# Patient Record
Sex: Female | Born: 1965 | Race: Black or African American | Hispanic: No | Marital: Married | Smoking: Never smoker
Health system: Southern US, Community
[De-identification: ages and names within clinical notes are randomized; demographics above are authoritative.]

## PROBLEM LIST (undated history)

## (undated) DIAGNOSIS — D259 Leiomyoma of uterus, unspecified: Secondary | ICD-10-CM

## (undated) DIAGNOSIS — L309 Dermatitis, unspecified: Secondary | ICD-10-CM

## (undated) DIAGNOSIS — R011 Cardiac murmur, unspecified: Secondary | ICD-10-CM

## (undated) DIAGNOSIS — Z923 Personal history of irradiation: Secondary | ICD-10-CM

## (undated) DIAGNOSIS — D649 Anemia, unspecified: Secondary | ICD-10-CM

## (undated) DIAGNOSIS — Z803 Family history of malignant neoplasm of breast: Secondary | ICD-10-CM

## (undated) DIAGNOSIS — I10 Essential (primary) hypertension: Secondary | ICD-10-CM

## (undated) DIAGNOSIS — J45909 Unspecified asthma, uncomplicated: Secondary | ICD-10-CM

## (undated) DIAGNOSIS — K219 Gastro-esophageal reflux disease without esophagitis: Secondary | ICD-10-CM

## (undated) DIAGNOSIS — Z8042 Family history of malignant neoplasm of prostate: Secondary | ICD-10-CM

## (undated) DIAGNOSIS — Z801 Family history of malignant neoplasm of trachea, bronchus and lung: Secondary | ICD-10-CM

## (undated) DIAGNOSIS — IMO0001 Reserved for inherently not codable concepts without codable children: Secondary | ICD-10-CM

## (undated) DIAGNOSIS — E039 Hypothyroidism, unspecified: Secondary | ICD-10-CM

## (undated) DIAGNOSIS — N6489 Other specified disorders of breast: Secondary | ICD-10-CM

## (undated) DIAGNOSIS — R896 Abnormal cytological findings in specimens from other organs, systems and tissues: Secondary | ICD-10-CM

## (undated) DIAGNOSIS — Z8619 Personal history of other infectious and parasitic diseases: Secondary | ICD-10-CM

## (undated) DIAGNOSIS — C50919 Malignant neoplasm of unspecified site of unspecified female breast: Secondary | ICD-10-CM

## (undated) DIAGNOSIS — N870 Mild cervical dysplasia: Secondary | ICD-10-CM

## (undated) DIAGNOSIS — Z9221 Personal history of antineoplastic chemotherapy: Secondary | ICD-10-CM

## (undated) HISTORY — PX: TONSILLECTOMY: SUR1361

## (undated) HISTORY — DX: Dermatitis, unspecified: L30.9

## (undated) HISTORY — PX: MYOMECTOMY: SHX85

## (undated) HISTORY — PX: TONSILECTOMY, ADENOIDECTOMY, BILATERAL MYRINGOTOMY AND TUBES: SHX2538

## (undated) HISTORY — DX: Anemia, unspecified: D64.9

## (undated) HISTORY — DX: Personal history of other infectious and parasitic diseases: Z86.19

## (undated) HISTORY — PX: LEEP: SHX91

## (undated) HISTORY — DX: Family history of malignant neoplasm of prostate: Z80.42

## (undated) HISTORY — DX: Family history of malignant neoplasm of trachea, bronchus and lung: Z80.1

## (undated) HISTORY — DX: Unspecified asthma, uncomplicated: J45.909

## (undated) HISTORY — DX: Cardiac murmur, unspecified: R01.1

## (undated) HISTORY — DX: Leiomyoma of uterus, unspecified: D25.9

## (undated) HISTORY — PX: ADENOIDECTOMY: SUR15

## (undated) HISTORY — DX: Mild cervical dysplasia: N87.0

## (undated) HISTORY — DX: Family history of malignant neoplasm of breast: Z80.3

## (undated) HISTORY — DX: Hypothyroidism, unspecified: E03.9

## (undated) HISTORY — DX: Reserved for inherently not codable concepts without codable children: IMO0001

## (undated) HISTORY — DX: Abnormal cytological findings in specimens from other organs, systems and tissues: R89.6

---
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
## 1999-12-23 ENCOUNTER — Other Ambulatory Visit: Admission: RE | Admit: 1999-12-23 | Discharge: 1999-12-23 | Payer: Self-pay | Admitting: Obstetrics and Gynecology

## 2001-03-13 ENCOUNTER — Other Ambulatory Visit: Admission: RE | Admit: 2001-03-13 | Discharge: 2001-03-13 | Payer: Self-pay | Admitting: *Deleted

## 2002-03-23 ENCOUNTER — Other Ambulatory Visit: Admission: RE | Admit: 2002-03-23 | Discharge: 2002-03-23 | Payer: Self-pay | Admitting: *Deleted

## 2002-06-28 ENCOUNTER — Other Ambulatory Visit: Admission: RE | Admit: 2002-06-28 | Discharge: 2002-06-28 | Payer: Self-pay | Admitting: Gynecology

## 2002-11-14 ENCOUNTER — Other Ambulatory Visit: Admission: RE | Admit: 2002-11-14 | Discharge: 2002-11-14 | Payer: Self-pay | Admitting: Gynecology

## 2003-06-20 ENCOUNTER — Other Ambulatory Visit: Admission: RE | Admit: 2003-06-20 | Discharge: 2003-06-20 | Payer: Self-pay | Admitting: Gynecology

## 2003-08-07 ENCOUNTER — Inpatient Hospital Stay (HOSPITAL_COMMUNITY): Admission: RE | Admit: 2003-08-07 | Discharge: 2003-08-10 | Payer: Self-pay | Admitting: Gynecology

## 2003-08-07 ENCOUNTER — Encounter (INDEPENDENT_AMBULATORY_CARE_PROVIDER_SITE_OTHER): Payer: Self-pay | Admitting: Specialist

## 2003-10-28 ENCOUNTER — Other Ambulatory Visit: Admission: RE | Admit: 2003-10-28 | Discharge: 2003-10-28 | Payer: Self-pay | Admitting: Gynecology

## 2004-01-17 ENCOUNTER — Other Ambulatory Visit: Admission: RE | Admit: 2004-01-17 | Discharge: 2004-01-17 | Payer: Self-pay | Admitting: Gynecology

## 2004-04-27 ENCOUNTER — Other Ambulatory Visit: Admission: RE | Admit: 2004-04-27 | Discharge: 2004-04-27 | Payer: Self-pay | Admitting: Gynecology

## 2004-10-15 ENCOUNTER — Other Ambulatory Visit: Admission: RE | Admit: 2004-10-15 | Discharge: 2004-10-15 | Payer: Self-pay | Admitting: Gynecology

## 2005-01-25 ENCOUNTER — Other Ambulatory Visit: Admission: RE | Admit: 2005-01-25 | Discharge: 2005-01-25 | Payer: Self-pay | Admitting: Gynecology

## 2005-04-27 ENCOUNTER — Other Ambulatory Visit: Admission: RE | Admit: 2005-04-27 | Discharge: 2005-04-27 | Payer: Self-pay | Admitting: Gynecology

## 2005-09-24 ENCOUNTER — Other Ambulatory Visit: Admission: RE | Admit: 2005-09-24 | Discharge: 2005-09-24 | Payer: Self-pay | Admitting: Gynecology

## 2005-12-23 ENCOUNTER — Ambulatory Visit (HOSPITAL_COMMUNITY): Admission: RE | Admit: 2005-12-23 | Discharge: 2005-12-23 | Payer: Self-pay | Admitting: Obstetrics and Gynecology

## 2007-02-25 ENCOUNTER — Emergency Department (HOSPITAL_COMMUNITY): Admission: EM | Admit: 2007-02-25 | Discharge: 2007-02-25 | Payer: Self-pay | Admitting: Family Medicine

## 2007-05-15 ENCOUNTER — Ambulatory Visit (HOSPITAL_COMMUNITY): Admission: RE | Admit: 2007-05-15 | Discharge: 2007-05-15 | Payer: Self-pay | Admitting: Obstetrics and Gynecology

## 2008-04-03 ENCOUNTER — Ambulatory Visit (HOSPITAL_COMMUNITY): Admission: RE | Admit: 2008-04-03 | Discharge: 2008-04-03 | Payer: Self-pay | Admitting: Specialist

## 2008-04-19 ENCOUNTER — Encounter: Admission: RE | Admit: 2008-04-19 | Discharge: 2008-04-19 | Payer: Self-pay | Admitting: Obstetrics and Gynecology

## 2009-03-21 ENCOUNTER — Ambulatory Visit (HOSPITAL_COMMUNITY): Admission: RE | Admit: 2009-03-21 | Discharge: 2009-03-22 | Payer: Self-pay | Admitting: Obstetrics and Gynecology

## 2009-03-21 ENCOUNTER — Encounter (INDEPENDENT_AMBULATORY_CARE_PROVIDER_SITE_OTHER): Payer: Self-pay | Admitting: Obstetrics and Gynecology

## 2009-05-05 ENCOUNTER — Ambulatory Visit (HOSPITAL_COMMUNITY): Admission: RE | Admit: 2009-05-05 | Discharge: 2009-05-05 | Payer: Self-pay | Admitting: Internal Medicine

## 2009-09-29 ENCOUNTER — Encounter: Admission: RE | Admit: 2009-09-29 | Discharge: 2009-09-29 | Payer: Self-pay | Admitting: Obstetrics and Gynecology

## 2010-02-08 ENCOUNTER — Encounter: Payer: Self-pay | Admitting: Obstetrics and Gynecology

## 2010-04-06 ENCOUNTER — Ambulatory Visit (HOSPITAL_COMMUNITY)
Admission: RE | Admit: 2010-04-06 | Discharge: 2010-04-06 | Disposition: A | Discharge status: Home / Self Care | Payer: BC Managed Care – PPO | Source: Ambulatory Visit | Attending: Obstetrics and Gynecology | Admitting: Obstetrics and Gynecology

## 2010-04-06 ENCOUNTER — Other Ambulatory Visit: Payer: Self-pay | Admitting: Obstetrics and Gynecology

## 2010-04-06 DIAGNOSIS — N870 Mild cervical dysplasia: Secondary | ICD-10-CM | POA: Insufficient documentation

## 2010-04-06 LAB — PREGNANCY, URINE: Preg Test, Ur: NEGATIVE

## 2010-04-12 LAB — CBC
HCT: 34.3 % — ABNORMAL LOW (ref 36.0–46.0)
Hemoglobin: 11.2 g/dL — ABNORMAL LOW (ref 12.0–15.0)
Hemoglobin: 12.4 g/dL (ref 12.0–15.0)
MCHC: 33.6 g/dL (ref 30.0–36.0)
Platelets: 203 10*3/uL (ref 150–400)
Platelets: 246 10*3/uL (ref 150–400)
RBC: 3.95 MIL/uL (ref 3.87–5.11)
RBC: 4.33 MIL/uL (ref 3.87–5.11)
RDW: 13.1 % (ref 11.5–15.5)
RDW: 13.4 % (ref 11.5–15.5)
WBC: 15.9 10*3/uL — ABNORMAL HIGH (ref 4.0–10.5)

## 2010-04-12 LAB — ABO/RH: ABO/RH(D): O POS

## 2010-04-12 LAB — BASIC METABOLIC PANEL
BUN: 11 mg/dL (ref 6–23)
Sodium: 138 mEq/L (ref 135–145)

## 2010-04-12 LAB — TYPE AND SCREEN: ABO/RH(D): O POS

## 2010-04-19 NOTE — Op Note (Signed)
  Veronica Reilly, DAM               ACCOUNT NO.:  [id number]  MEDICAL RECORD NO.:  [id number]           PATIENT TYPE:  O  LOCATION:  [facility]                          [facility]  PHYSICIAN:  Crist Fat. Halbert Jesson, M.D. DATE OF BIRTH:  27-Aug-1965  DATE OF PROCEDURE: DATE OF DISCHARGE:                              OPERATIVE REPORT   PREOPERATIVE DIAGNOSIS:  Mild dysplasia on endocervical curettage.  POSTOPERATIVE DIAGNOSIS:  Mild dysplasia on endocervical curettage.  ANESTHESIA:  Local, Dr. Estanislado Pandy.  PROCEDURE:  Loop electrical excision procedure (LEEP).  SURGEON:  Crist Fat. Nicie Milan, MD  ASSISTANT:  None.  ESTIMATED BLOOD LOSS:  Minimal.  PROCEDURE:  After being informed of the planned procedure with possible complications including bleeding, infection, persistence of disease, cervical stenosis, and cervical incompetence, informed consent was obtained.  The patient was taken to OR #7 and placed in the lithotomy position.  A speculum was inserted and the cervix was prepped with acetic acid.  Colposcopy reveals the previously known lesion markedly from 12 to 3 and from 4 to 7 with sharp borders in a mild mosaic pattern.  We proceeded with local anesthesia with a paracervical block using lidocaine 1% with epinephrine 1 in 200,000, 15 mL in the usual fashion.  Using a medium-sized loupe, we then excised the posterior lip and then the anterior lip of the cervix removing the whole T-zone and the previously described lesion.  The excision surface was then cauterized and Monsel was applied.  Hemostasis was adequate. Instruments were removed.  Instruments and sponge count was complete x2. Estimated blood loss was minimal.  Procedures well tolerated by the patient who was taken to recovery room and discharged home in a well and stable condition.  SPECIMEN:  Anterior lip and posterior lip of the cervix sent to Pathology.     Crist Fat Draven Laine, M.D.     SAR/MEDQ  D:  04/06/2010  T:   04/07/2010  Job:  [id number]  Electronically Signed by Silverio Lay M.D. on 04/19/2010 10:07:33 PM

## 2010-06-02 NOTE — Op Note (Signed)
Veronica Reilly, Veronica Reilly               ACCOUNT NO.:  [id number]   MEDICAL RECORD NO.:  [id number]          PATIENT TYPE:  AMB   LOCATION:  [facility]                           [facility]   PHYSICIAN:  Fermin Schwab, MD   DATE OF BIRTH:  01/18/1966   DATE OF PROCEDURE:  05/15/2007  DATE OF DISCHARGE:                               OPERATIVE REPORT   PREOPERATIVE DIAGNOSES:  Probable pelvic adhesions, uterine myomas.   POSTOPERATIVE DIAGNOSES:  Pelvic adhesions, uterine myomas, stage I  endometriosis of pelvic peritoneum.   PROCEDURE:  Laparoscopy, lysis of adhesions (electrosurgical), excision  and ablation of endometriosis.   SURGEON:  Fermin Schwab, MD.   ANESTHESIA:  General endotracheal.   COMPLICATIONS:  None.   ESTIMATED BLOOD LOSS:  Less than 20 mL.   SPECIMENS:  Right iliac fossa biopsy and uterine adhesions to pathology.   FINDINGS:  Exam under anesthesia, the uterus was enlarged to 8-9 weeks  with myomatous nodules.  There was a posterior one that was prominent  during exam of the adnexa.  The adnexa were otherwise unremarkable.  No  palpable masses were present.  The cervix and the vagina were within  normal limits.  On laparoscopy the liver, gallbladder, and diaphragm  surfaces were normal.  The appendix appeared normal.  On inspection of  the pelvis the uterus contained several small nodules, 2-3 cm.  The most  prominent one was the lower uterine posterior myoma that was intramural.  There were filmy adhesions to the posterior left side of the uterus from  the epiploic appendix, as well as filmy adhesions to the midline of the  uterus posteriorly.  The left tube was somewhat kinked at its mid  section, but the fimbriae were 4/5.  It was patent to chromotubation.  The left ovary had filmy adhesions to the ovarian fossa covering less  than 1/5 of it surface area.  It contained a corpus luteum.  The right  fallopian tube was normal with fimbria 5/5.  It was patent  to  chromotubation.  The right ovary was grossly normal.  The inspection of  the posterior cul-de-sac was somewhat difficult because of the lower  uterine myoma.  As it was exposed there were polypoid lesions of  endometriosis at the insertion of the left uterosacral ligament.  There  was also a nodule in the right ovarian fossa that was excised.   DESCRIPTION OF PROCEDURE:  The patient was placed in the lithotomy  position.  She was prepped and draped in the sterile manner.  The  bladder catheterized with a Foley.  A vaginal speculum was inserted and  the uterus sounded to 9 cm.  A ZUMI manipulator/injector cannula was  inserted into the uterus and it was connected to a syringe containing  indigo carmine.  The surgeon was re-gloved and the operative field was  created on the abdomen.  After preemptive anesthesia with 0.25% Marcaine  and 1:200,000 epinephrine a 10 mm vertical infraumbilical skin incision  was made and the Veress needle was inserted.  Its correct location as  verified with low opening pressure.  A pneumoperitoneum was created with  carbon dioxide.  A 10 mm trocar was then inserted.  The video  laparoscopy was started.  Two 5 mm trocars were placed in each lower  abdominal quadrant and ancillary instruments were used through these  ports.  The above findings were noted.  Using monopolar needle electrode  at a setting of 35 watts cutting the adhesions were taken down and they  were excised from the large bowel.  The left ovary was freed from up its  adhesion.  The left uterosacral ligament lesion was ablated again with  needle electrode.  The right iliac fossa lesion was excised.  Hemostasis  was checked.  A slurry consisting of one sheath of Seprafilm and 20 mL  of lactated Ringer was instilled onto the uterine and ovarian surface  and rectosigmoid surface and into the cul-de-sac for prevention of  adhesions.  The estimated blood loss was less than 10 mL.  The gas was   allowed to escape.  The infraumbilical incision was closed with 2-0  Vicryl figure-of-eight suture.  Dermabond was used to approximate the  skin edges.  The patient tolerated the procedure well and was  transferred to the recovery room in satisfactory condition.      Fermin Schwab, MD  Electronically Signed     TY/MEDQ  D:  05/15/2007  T:  05/15/2007  Job:  [id number]   cc:   Maxie Better, M.D.  Fax: [phone]

## 2010-06-05 NOTE — Op Note (Signed)
Veronica Reilly, Veronica Reilly                           ACCOUNT NO.:  [id number]   MEDICAL RECORD NO.:  [id number]                   PATIENT TYPE:  INP   LOCATION:  X004                                 FACILITY:  [facility]   PHYSICIAN:  Ivor Costa. Farrel Gobble, M.D.              DATE OF BIRTH:  March 14, 1965   DATE OF PROCEDURE:  08/07/2003  DATE OF DISCHARGE:                                 OPERATIVE REPORT   PREOPERATIVE DIAGNOSES:  1. Multifibroid uterus.  2. Endometrial defect.   POSTOPERATIVE DIAGNOSIS:  1. Multifibroid uterus.  2. Endometrial defect.   PROCEDURE:  1. Multiple myomectomy.  2. Submucosal polypectomy.  3. Attempted chromopertubation.   SURGEON:  Ivor Costa. Farrel Gobble, M.D.   ASSISTANTMarcial Pacas P. Fontaine, M.D.   ANESTHESIA:  General.   IV FLUIDS:  2200 cc of lactated Ringer's.   ESTIMATED BLOOD LOSS:  150 cc.   URINE OUTPUT:  875 cc of clear urine.   FINDINGS:  The uterus was markedly distorted.  There was approximately an 8  x 6 cm intramural posterior fibroid and 4-5 cm anterior wall fibroid with a  small cluster of additional fibroids in the anterior region.  There was also  a 4 cm bundle fibroid that was distinctly separate from the tubal ostia.  There were two 2 cm superficial subserosal fibroids.  There was a polypoid  submucosal defect and normal-appearing tubes; however, no flow by  chromopertubation.  Pathology for multiple fibroids and a portion of the  myometrium and endometrial polyp.   COMPLICATIONS:  None.   PROCEDURE:  Patient was taken to the operating room, and general anesthesia  was induced.  Placed in the supine position.  Prepped and draped in the  usual sterile fashion.  A sterile bivalve speculum was placed into the  vagina.  The cervix was visualized.  A #8 pediatric Foley was advanced  through the cervix towards the fundus, and the balloon was blown up.  This  was attached to sterile tubing for chromopertubations to be done during the  procedure.  She was then draped in the usual sterile fashion.  A  Pfannenstiel skin incision was made with the scalpel and carried through an  underlying layer of fascia with the electrocautery.  The fascia was scored  in the midline, and an incision was made laterally with the Mayo scissors.  Deep thoracic and fascial scissors were grasped.  Kochers and underlying  rectus muscles were dissected off for blunt and sharp dissection.  In a  similar fashion, the superior aspect of the tissue was grasped with Kochers.  The underlying rectus muscles were dissected off.  The rectus muscles were  separated in the midline.  The peritoneum was identified and entered  bluntly.  The peritoneal incision was then incised superiorly and  inferiorly.  Good visualization of underlying bowel and bladder.  The  examining hand was then placed into the pelvis  and with effort, the uterus  was able to be delivered through the incision.  The large posterior fibroid  was somewhat distorting, but it did allow [place] to place the uterus above the  incision.  At this point, we attempt to chromopertubate.  Despite free flow  of the fluid, we were unable to get any flow from the tube and perhaps this  was related to the tension that the uterus was in, based on the anatomy.  The tubal fimbria; however, appeared to be __________ .  The fibroids were  then individually injected with dilute Pitressin solution with 20 units in  100, a total of 23 cc was used.  The serosa was then scored with the  cautery, and the dissection was carried through until the fibroid was  reached.  The fibroid was then grasped with a single-tooth tenaculum and  sharply and bluntly dissected off posteriorly.  The area was noted to be  relatively hemostatic.  The posterior defect was reapproximated with Allis  clamps.  As we turned our attention anteriorly and in a similar fashion, the  serosa was injected, incised, and the fibroid was removed.  There  noted to  be a small cluster of approximately 3-4 very small fibroids that were  appreciated anteriorly, and we just sharply excised this with a portion of  the myometrium again, and similarly, this area was reapproximated with Allis  clamps until closure.  The fundal fibroid was similarly treated.  All of  these fibroids, although deep into the myometrium, none of them entered the  cavity.  Because we knew of the cavity defects, we had already planned on  entering the cavity, and we have elected to do this posteriorly.  The  superficial subserosal fibroids were able to be sharply dissected off.  We  then began to close our defects.  The superficial defects were brought  together with 3-0 Vicryl and noted to be hemostatic.  There was a very large  defect within the anterior fibroid, and some of the excess myometrium was  sharply dissected off.  The myometrium was then reapproximated with  interrupted 0 Vicryl.  The upper-most layers were brought together with 3-0  Vicryl and noted to be hemostatic.  The fundal area was also reapproximated  deeply with 0 Vicryl with the most superficial area being treated with 3-0  Vicryl again.  The posterior defect was then palpated.  It was extended for  a small amount, and the cavity was entered, as evidenced by the blue dye.  Palpation initially over the cavity failed to demonstrate the 2.5 cm defect,  which was felt to be a fibroid.  On sonohystogram, there was also a  questionable endometrial polyp.  We were able to visualize the polypoid  mass.  This was grasped with an Allis clamp and gently dissected off.  Reinspection of the cavity confirmed that it was clean, smooth, without any  gross defects.  The endometrium was then reapproximated with interrupted 3-0  Vicryl.  The myometrium above it was reapproximated with 0 Vicryl in  multiple deep layers, after which the serosa was closed with 3-0 Vicryl again.  The incisions were irrigated and noted to  be hemostatic.  The uterus  was then returned back to the pelvis.  The patient was noted to have a very  prominent sacrum, which limited our ability to successfully place the  interseed posteriorly; however, we were able to place interseed over the  anterior and fundal regions after  an O'Connor-O'Sullivan retractor was  placed.  The incisions remained hemostatic-appearing, once returning to the  pelvis.  The fascia was then closed with 0 Vicryl in a running fashion.  The  subcu was irrigated, and the skin was closed with staples.  The patient  tolerated the procedure well.  Sponge, lap, and needle counts were correct  x2.  She was transferred to the PACU in stable condition.                                               Ivor Costa. Farrel Gobble, M.D.    Leda Roys  D:  08/07/2003  T:  08/07/2003  Job:  [id number]

## 2010-06-05 NOTE — Discharge Summary (Signed)
NAMELAVERGNE, HILTUNEN               ACCOUNT NO.:  [id number]   MEDICAL RECORD NO.:  [id number]          PATIENT TYPE:  INP   LOCATION:  0468                         [facility]ACILITY:  [facility]   PHYSICIAN:  Ivor Costa. Farrel Gobble, M.D. DATE OF BIRTH:  11/16/1965   DATE OF ADMISSION:  08/07/2003  DATE OF DISCHARGE:  08/10/2003                                 DISCHARGE SUMMARY   PRINCIPAL DIAGNOSIS:  Multifibroid uterus.   ADDITIONAL DIAGNOSIS:  Endometrial defect.   PRINCIPAL PROCEDURE:  Multiple myomectomy.   ADDITIONAL PROCEDURE:  Submucosal polypectomy and attempt at  chromopertubation.   HISTORY OF PRESENT ILLNESS:  Please refer to the dictated H&P.   HOSPITAL COURSE:  The patient was admitted in the afternoon of August 07, 2003, and underwent a multiple myomectomy with a submucosal polypectomy  under general anesthesia.  Her operative findings were consistent with a  markedly distorted uterus.  There were multiple intramural myomas and a  subserosal polypoid defect.  The patient's estimated blood loss was 350 mL.  Her postoperative course was unremarkable.  She was extubated in the OR,  transferred to the PACU in stable condition and up to the postoperative  floor in due fashion.  Her postoperative course was unremarkable.  By postop  day #3, the patient was ambulating without differential, tolerating a  regular diet, requiring minimal pain medication that was treated orally.  She was without any complaints.   POSTOPERATIVE LABORATORY DATA:  Her white count was 14.1; her hemoglobin was  9.2; her hematocrit was 28.5, and her platelets were 225.   The patient was discharged home with instructions to follow up in the office  in 2 weeks.  She was given a prescription preoperatively for Tylox 1-2 q.6h.  p.r.n. pain.  She will mix it with over-the-counter Motrin.  Her condition  is stable.      THL/MEDQ  D:  10/13/2003  T:  10/14/2003  Job:  [id number]

## 2010-06-05 NOTE — H&P (Signed)
NAME:  Veronica Reilly, Veronica Reilly                           ACCOUNT NO.:  [id number]   MEDICAL RECORD NO.:  [id number]                   PATIENT TYPE:   LOCATION:                                       FACILITY:   PHYSICIAN:  Ivor Costa. Farrel Gobble, M.D.              DATE OF BIRTH:   DATE OF ADMISSION:  DATE OF DISCHARGE:                                HISTORY & PHYSICAL   CHIEF COMPLAINT:  Multifibroid uterus desiring fertility.   HISTORY OF PRESENT ILLNESS:  The patient is a 45 year old G0 with a known  multifibroid uterus and subsequent menorrhagia, who is desiring fertility.  Her uterus is, by ultrasound, 20 x 9-1/2 x 9-1/2 with the endometrium being  displaced by the myomas.  There are also 2 endometrial defects, which are  suspicious for being endometrial fibroids as well that measure 2-1/2 x 2-1/2  and 2-1/2 x 10.  The patient has 4 rather large fibroids that are between 6  and 8 cm each.  Because of an iodine allergy, the patient has not had any  __________ done and accepts the limitations regarding her tubes.  She will  have a chromopertubation done at the same time.  The patient states that her  cycles are monthly.  However, she is on oral contraceptives.  She flows for  about 7 days despite being on oral contraceptives.  She does have a history  of anemia with her latest hemoglobin being 9.6.  They have never attempted  pregnancy because of medical issues with her husband, which have now  resolved.  Her Pap smears have otherwise been normal and she has a negative  gynecologic history.   PAST MEDICAL HISTORY:  Negative.   PAST SURGICAL HISTORY:  Negative.   MEDICATIONS:  Yasmin.   ALLERGIES:  1. IODINE.  2. GRASS.  3. TREES.  4. POLLEN.  5. DUST.   SOCIAL HISTORY:  She is married.  No alcohol, no tobacco.  Rare caffeine.  Some exercise in the form of walking.   FAMILY HISTORY:  Positive for breast cancer in a grandmother.  Otherwise  negative for uterine and ovarian.   PHYSICAL  EXAMINATION:  GENERAL:  She is well-appearing.  VITAL SIGNS:  Her heart was regular rate.  LUNGS:  Clear to auscultation.  ABDOMEN:  There is a fullness at the umbilicus that extends towards the hips  bilaterally consistent with her fibroid uterus.  GYNECOLOGIC:  She has normal external female genitalia.  The BUS is  negative.  The vagina had a small amount of menstruum in it.  The cervix is  without gross lesions.  Bimanual examination, there is a large posterior  fibroid that is appreciable.  Immediately upon doing the examination the  uterus is at the umbilicus, bulky, and irregular.   ASSESSMENT:  Multifibroid uterus with 2 probable submucosal myomas as well  as several pedunculated and intramural myomas with an iodine allergy.  The  patient will undergo a multiple myomectomy.  We will plan on opening the  cavity at the time in order to remove the 2 endometrial defects.  We will  attempt to do a chromopertubation prior to the procedure.  However, the  patient is aware that we may not be able to get flow from the tube because  of impingement from the fibroids at the beginning of the case and from  having a defect in the endometrium from the procedure at the end of the case  but is willing to accept this.  The patient was also counseled for the  potential for hysterectomy and will sign a consent accordingly as well as  the risk for transfusion.  All questions were addressed.  She was given her  postoperative pain management in the form of Combunox for postoperative  care.                                               Ivor Costa. Farrel Gobble, M.D.    THL/MEDQ  D:  08/05/2003  T:  08/05/2003  Job:  [id number]

## 2010-10-13 LAB — URINALYSIS, ROUTINE W REFLEX MICROSCOPIC
Bilirubin Urine: NEGATIVE
Glucose, UA: NEGATIVE
Hgb urine dipstick: NEGATIVE
Ketones, ur: NEGATIVE
Specific Gravity, Urine: 1.01

## 2010-10-13 LAB — DIFFERENTIAL
Basophils Absolute: 0.1
Basophils Relative: 1
Eosinophils Relative: 3
Monocytes Relative: 9
Neutro Abs: 5.5

## 2010-10-13 LAB — COMPREHENSIVE METABOLIC PANEL
ALT: 13
Alkaline Phosphatase: 48
GFR calc Af Amer: >60
GFR calc non Af Amer: >60
Glucose, Bld: 98
Potassium: 3.6
Sodium: 133 — ABNORMAL LOW

## 2010-10-13 LAB — CBC
MCHC: 33.6
MCV: 84.3
Platelets: 213

## 2010-10-13 LAB — ABO/RH: ABO/RH(D): O POS

## 2010-10-13 LAB — TYPE AND SCREEN: ABO/RH(D): O POS

## 2010-10-13 LAB — HCG, SERUM, QUALITATIVE: Preg, Serum: NEGATIVE

## 2010-10-19 ENCOUNTER — Other Ambulatory Visit: Payer: Self-pay | Admitting: Obstetrics and Gynecology

## 2010-10-19 DIAGNOSIS — Z1231 Encounter for screening mammogram for malignant neoplasm of breast: Secondary | ICD-10-CM

## 2010-10-29 ENCOUNTER — Ambulatory Visit: Payer: BC Managed Care – PPO

## 2010-11-03 ENCOUNTER — Ambulatory Visit: Payer: BC Managed Care – PPO

## 2010-11-16 ENCOUNTER — Ambulatory Visit: Payer: BC Managed Care – PPO

## 2010-11-16 ENCOUNTER — Ambulatory Visit
Admission: RE | Admit: 2010-11-16 | Discharge: 2010-11-16 | Disposition: A | Discharge status: Home / Self Care | Payer: BC Managed Care – PPO | Source: Ambulatory Visit | Attending: Obstetrics and Gynecology | Admitting: Obstetrics and Gynecology

## 2010-11-16 DIAGNOSIS — Z1231 Encounter for screening mammogram for malignant neoplasm of breast: Secondary | ICD-10-CM

## 2011-04-12 ENCOUNTER — Ambulatory Visit (INDEPENDENT_AMBULATORY_CARE_PROVIDER_SITE_OTHER): Payer: BC Managed Care – PPO | Admitting: Obstetrics and Gynecology

## 2011-04-12 DIAGNOSIS — N870 Mild cervical dysplasia: Secondary | ICD-10-CM

## 2011-08-31 ENCOUNTER — Telehealth: Payer: Self-pay

## 2011-08-31 NOTE — Telephone Encounter (Signed)
LM for pt that pap was due in July along with AEX.  ld

## 2011-09-21 ENCOUNTER — Encounter: Payer: Self-pay | Admitting: Obstetrics and Gynecology

## 2011-09-21 ENCOUNTER — Ambulatory Visit (INDEPENDENT_AMBULATORY_CARE_PROVIDER_SITE_OTHER): Payer: BC Managed Care – PPO | Admitting: Obstetrics and Gynecology

## 2011-09-21 VITALS — BP 124/62 | Ht 66.5 in | Wt 222.0 lb

## 2011-09-21 DIAGNOSIS — Z124 Encounter for screening for malignant neoplasm of cervix: Secondary | ICD-10-CM

## 2011-09-21 DIAGNOSIS — N870 Mild cervical dysplasia: Secondary | ICD-10-CM

## 2011-09-21 NOTE — Progress Notes (Signed)
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 female who presents for a  repeat pap    Objective:    External genitalia: normal Perianal skin: no external genital warts noted Vagina: normal without discharge Cervix: normal in appearance  Assessment and Plan:   CIN 1  Pap # 2/3 performed Return to the office in 4 months for repeat Pap until 3 consecutive normal Pap Smears Continue Folic Acid 1 mg daily Referred to Dr Allyne Gee for PCP / weight loss Pt to see Dr Elesa Hacker for donor egg / IVF   Silverio Lay, MD 9/3/20139:09 AM

## 2011-09-24 LAB — PAP IG W/ RFLX HPV ASCU

## 2011-11-29 ENCOUNTER — Other Ambulatory Visit: Payer: Self-pay | Admitting: Obstetrics and Gynecology

## 2011-11-29 DIAGNOSIS — Z1231 Encounter for screening mammogram for malignant neoplasm of breast: Secondary | ICD-10-CM

## 2012-01-07 ENCOUNTER — Ambulatory Visit: Payer: BC Managed Care – PPO

## 2012-01-27 ENCOUNTER — Ambulatory Visit: Payer: BC Managed Care – PPO

## 2012-02-09 ENCOUNTER — Encounter: Payer: Self-pay | Admitting: Obstetrics and Gynecology

## 2012-02-09 ENCOUNTER — Ambulatory Visit: Payer: BC Managed Care – PPO | Admitting: Obstetrics and Gynecology

## 2012-02-09 ENCOUNTER — Telehealth: Payer: Self-pay | Admitting: Obstetrics and Gynecology

## 2012-02-09 VITALS — BP 120/64 | Ht 66.5 in | Wt 226.0 lb

## 2012-02-09 DIAGNOSIS — D219 Benign neoplasm of connective and other soft tissue, unspecified: Secondary | ICD-10-CM

## 2012-02-09 DIAGNOSIS — IMO0002 Reserved for concepts with insufficient information to code with codable children: Secondary | ICD-10-CM

## 2012-02-09 DIAGNOSIS — Z01419 Encounter for gynecological examination (general) (routine) without abnormal findings: Secondary | ICD-10-CM

## 2012-02-09 DIAGNOSIS — E039 Hypothyroidism, unspecified: Secondary | ICD-10-CM | POA: Insufficient documentation

## 2012-02-09 NOTE — Progress Notes (Signed)
The patient reports:no complaints  Contraception:none  Last mammogram: approximate date 11/18/2010 and was normal  Last pap: approximate date 04/12/2011 and was normal  GC/Chlamydia cultures offered: declined HIV/RPR/HbsAg offered:  declined HSV 1 and 2 glycoprotein offered: declined  Menstrual cycle regular and monthly: Yes Pt does state that her cycles are every 22 days instead of 28 Menstrual flow normal: Yes  Urinary symptoms: none Normal bowel movements: Yes Reports abuse at home: No:   Subjective:    Veronica Reilly is a 47 y.o. female G0P0000 who presents for annual exam.  The patient has no complaints today.   The following portions of the patient's history were reviewed and updated as appropriate: allergies, current medications, past family history, past medical history, past social history, past surgical history and problem list.  Review of Systems Pertinent items are noted in HPI. Gastrointestinal:No change in bowel habits, no abdominal pain, no rectal bleeding Genitourinary:negative for dysuria, frequency, hematuria, nocturia and urinary incontinence    Objective:     BP 120/64  Ht 5' 6.5" (1.689 m)  Wt 226 lb (102.513 kg)  BMI 35.93 kg/m2  LMP 02/09/2012  Weight:  Wt Readings from Last 1 Encounters:  02/09/12 226 lb (102.513 kg)     BMI: Body mass index is 35.93 kg/(m^2). General Appearance: Alert, appropriate appearance for age. No acute distress HEENT: Grossly normal Neck / Thyroid: Supple, no masses, nodes or enlargement Lungs: clear to auscultation bilaterally Back: No CVA tenderness Breast Exam: No masses or nodes.No dimpling, nipple retraction or discharge. Cardiovascular: Regular rate and rhythm. S1, S2,  Murmur 3/6 de novo Gastrointestinal: Soft, non-tender, no masses or organomegaly Pelvic Exam: Vulva and vagina appear normal. Bimanual exam reveals normal uterus and adnexa. Rectovaginal: normal rectal, no masses Lymphatic Exam: Non-palpable  nodes in neck, clavicular, axillary, or inguinal regions Skin: no rash or abnormalities Neurologic: Normal gait and speech, no tremor  Psychiatric: Alert and oriented, appropriate affect.   Assessment:    Normal gyn exam  CIN 1 s/p LEEP Heart murmur   Plan:   mammogram pap smear #2/3 return annually or prn Mmg scheduled for 02/2012 Heart Murmur discussed / Echocardiogram reccommended Referral to [facility] for Murmur   Silverio Lay MD

## 2012-02-10 ENCOUNTER — Telehealth: Payer: Self-pay

## 2012-02-10 LAB — PAP IG W/ RFLX HPV ASCU

## 2012-02-10 NOTE — Telephone Encounter (Signed)
Apt scheduled with [facility] for Monday 02/28/2012@ 2:30 p.m.  Pt aware  Darien Ramus, CMA

## 2012-02-10 NOTE — Telephone Encounter (Signed)
Message copied by Darien Ramus on Thu Feb 10, 2012  9:19 AM ------      Message from: Silverio Lay      Created: Wed Feb 09, 2012  4:28 PM       Please refer to cardiologist

## 2012-02-11 LAB — HUMAN PAPILLOMAVIRUS, HIGH RISK: HPV DNA High Risk: NOT DETECTED

## 2012-02-15 ENCOUNTER — Encounter: Payer: Self-pay | Admitting: Obstetrics and Gynecology

## 2012-02-15 NOTE — Progress Notes (Signed)
Quick Note:  Please send ASCUS letter. Pap in 1 year. ______ 

## 2012-02-21 ENCOUNTER — Ambulatory Visit
Admission: RE | Admit: 2012-02-21 | Discharge: 2012-02-21 | Disposition: A | Discharge status: Home / Self Care | Payer: BC Managed Care – PPO | Source: Ambulatory Visit | Attending: Obstetrics and Gynecology | Admitting: Obstetrics and Gynecology

## 2012-02-21 DIAGNOSIS — Z1231 Encounter for screening mammogram for malignant neoplasm of breast: Secondary | ICD-10-CM

## 2012-02-23 ENCOUNTER — Other Ambulatory Visit: Payer: Self-pay | Admitting: Obstetrics and Gynecology

## 2012-02-23 ENCOUNTER — Telehealth: Payer: Self-pay | Admitting: Obstetrics and Gynecology

## 2012-02-23 DIAGNOSIS — R928 Other abnormal and inconclusive findings on diagnostic imaging of breast: Secondary | ICD-10-CM

## 2012-02-24 NOTE — Progress Notes (Signed)
Quick Note:  The recent mammogram is incomplete / abnormal suggesting a close follow-up / additional images. When is the next appointment to follow-up on this mammogram?  Please document in chart. ______ 

## 2012-03-07 ENCOUNTER — Ambulatory Visit
Admission: RE | Admit: 2012-03-07 | Discharge: 2012-03-07 | Disposition: A | Discharge status: Home / Self Care | Payer: BC Managed Care – PPO | Source: Ambulatory Visit | Attending: Obstetrics and Gynecology | Admitting: Obstetrics and Gynecology

## 2012-03-07 DIAGNOSIS — R928 Other abnormal and inconclusive findings on diagnostic imaging of breast: Secondary | ICD-10-CM

## 2012-08-24 ENCOUNTER — Other Ambulatory Visit: Payer: Self-pay | Admitting: Obstetrics and Gynecology

## 2012-08-24 DIAGNOSIS — N6001 Solitary cyst of right breast: Secondary | ICD-10-CM

## 2012-09-12 ENCOUNTER — Ambulatory Visit
Admission: RE | Admit: 2012-09-12 | Discharge: 2012-09-12 | Disposition: A | Discharge status: Home / Self Care | Payer: BC Managed Care – PPO | Source: Ambulatory Visit | Attending: Obstetrics and Gynecology | Admitting: Obstetrics and Gynecology

## 2012-09-12 DIAGNOSIS — N6001 Solitary cyst of right breast: Secondary | ICD-10-CM

## 2012-12-02 ENCOUNTER — Ambulatory Visit (INDEPENDENT_AMBULATORY_CARE_PROVIDER_SITE_OTHER): Payer: BC Managed Care – PPO | Admitting: Emergency Medicine

## 2012-12-02 VITALS — BP 130/82 | HR 96 | Temp 97.9°F | Resp 18 | Ht 65.0 in | Wt 217.0 lb

## 2012-12-02 DIAGNOSIS — J209 Acute bronchitis, unspecified: Secondary | ICD-10-CM

## 2012-12-02 DIAGNOSIS — R05 Cough: Secondary | ICD-10-CM

## 2012-12-02 DIAGNOSIS — J45901 Unspecified asthma with (acute) exacerbation: Secondary | ICD-10-CM

## 2012-12-02 LAB — POCT RAPID STREP A (OFFICE): Rapid Strep A Screen: NEGATIVE

## 2012-12-02 MED ORDER — AZITHROMYCIN 250 MG PO TABS: ORAL_TABLET | ORAL | Status: DC

## 2012-12-02 MED ORDER — PREDNISONE 10 MG PO TABS: ORAL_TABLET | ORAL | Status: DC

## 2012-12-02 MED ORDER — ALBUTEROL SULFATE (2.5 MG/3ML) 0.083% IN NEBU
2.5000 mg | INHALATION_SOLUTION | Freq: Once | RESPIRATORY_TRACT | Status: AC
  Administered 2012-12-02: 2.5 mg via RESPIRATORY_TRACT

## 2012-12-02 NOTE — Progress Notes (Signed)
This chart was scribed for Veronica Chris, MD by Joaquin Music, ED Scribe. This patient was seen in room Room/bed 2 and the patient's care was started at 12:03 PM. Subjective:    Patient ID: Veronica Reilly, female    DOB: [birth year], 47 y.o.   MRN: [id number] Chief Complaint  Patient presents with  . Cough    yellow product  . Sore Throat   HPI Veronica Reilly is a 47 y.o. female with a hx of asthma and allergies who presents to the [facility] complaining of ongoing, worsening cough. Pt states she was seen at [facility] for Asthma and Allergies by Dr. Marko Stai on 11/21/2012. She states she was being seen for her asthma and allergies. Pt states her albuterol rx was changed from 100 to 200. She was also given her flu shot and received allergy screening. Pt states on 11/27/2012, she woke up feeling feverish and aching all over. She states she felt "she was hit by a bus". Pt states her coughing has worsened since then. Pt states she spoke with Dr. Marko Stai and he added 20 mg prednazon for 4 days and on 5th day take 1 tablet.  Pt states she has been coughing all day and night with minimal sleep. She states she has been having yellow sputum. Pt is also currently taking mucus relief, zyrtec, throat lozenges , proair, and singular. Pt denies having a nebulizer at home. Pt states she purchased a humidifier but states she did not see this helped her any. Pt denies having a sore throat. Pt denies smoking.  Pt states she has a delayed menstrual cycle but states she took a pregnancy test yesterday and it was negative. She states she has been stressed and is currently in graduate school.  Review of Systems  HENT: Negative for sore throat.   Respiratory: Positive for cough.        Objective:   Physical Exam CONSTITUTIONAL: Well developed/well nourished HEAD: Normocephalic/atraumatic EYES: EOMI/PERRL ENMT: Mucous membranes moist NECK: supple no meningeal signs SPINE:entire spine  nontender CV: S1/S2 noted, no murmurs/rubs/gallops noted LUNGS: Lungs are clear to auscultation bilaterally, no apparent distress. Patient has a raspy voice. There's prolongation of expiratory phase but no true rales are heard. ABDOMEN: soft, nontender, no rebound or guarding GU:no cva tenderness NEURO: Pt is awake/alert, moves all extremitiesx4 EXTREMITIES: pulses normal, full ROM SKIN: warm, color normal PSYCH: no abnormalities of mood noted Peak flow that was checked was 300  BP 130/82  Pulse 96  Temp(Src) 97.9 F (36.6 C)  Resp 18  Ht 5\' 5"  (1.651 m)  Wt 217 lb (98.431 kg)  BMI 36.11 kg/m2  SpO2 99%  LMP 10/24/2012  Assessment & Plan:  I suspect patient is having a flare of her asthma related to her recent allergy testing. She is only been on 20 mg of prednisone a day and probably needs a higher dose. We'll also cover with antibiotics because her sputum has been discolored. She is late on her period but had a pregnancy test yesterday that was negative

## 2012-12-02 NOTE — Progress Notes (Deleted)
  Subjective:    Patient ID: Veronica Reilly, female    DOB: October 01, 1965, 47 y.o.   MRN: [id number]  HPI    Review of Systems     Objective:   Physical Exam        Assessment & Plan:

## 2012-12-04 LAB — CULTURE, GROUP A STREP

## 2013-02-09 DIAGNOSIS — N978 Female infertility of other origin: Secondary | ICD-10-CM | POA: Insufficient documentation

## 2013-03-16 ENCOUNTER — Other Ambulatory Visit: Payer: Self-pay | Admitting: Obstetrics and Gynecology

## 2013-03-16 DIAGNOSIS — N6310 Unspecified lump in the right breast, unspecified quadrant: Secondary | ICD-10-CM

## 2013-03-29 ENCOUNTER — Inpatient Hospital Stay: Admission: RE | Admit: 2013-03-29 | Payer: BC Managed Care – PPO | Source: Ambulatory Visit

## 2013-03-29 ENCOUNTER — Other Ambulatory Visit: Payer: BC Managed Care – PPO

## 2013-04-11 ENCOUNTER — Other Ambulatory Visit: Payer: BC Managed Care – PPO

## 2013-04-23 ENCOUNTER — Ambulatory Visit
Admission: RE | Admit: 2013-04-23 | Discharge: 2013-04-23 | Disposition: A | Discharge status: Home / Self Care | Payer: BC Managed Care – PPO | Source: Ambulatory Visit | Attending: Obstetrics and Gynecology | Admitting: Obstetrics and Gynecology

## 2013-04-23 ENCOUNTER — Other Ambulatory Visit: Payer: BC Managed Care – PPO

## 2013-04-23 DIAGNOSIS — N6310 Unspecified lump in the right breast, unspecified quadrant: Secondary | ICD-10-CM

## 2013-10-16 ENCOUNTER — Other Ambulatory Visit: Payer: Self-pay | Admitting: Obstetrics and Gynecology

## 2013-10-16 DIAGNOSIS — N6310 Unspecified lump in the right breast, unspecified quadrant: Secondary | ICD-10-CM

## 2013-10-29 ENCOUNTER — Ambulatory Visit
Admission: RE | Admit: 2013-10-29 | Discharge: 2013-10-29 | Disposition: A | Discharge status: Home / Self Care | Payer: BC Managed Care – PPO | Source: Ambulatory Visit | Attending: Obstetrics and Gynecology | Admitting: Obstetrics and Gynecology

## 2013-10-29 DIAGNOSIS — N6310 Unspecified lump in the right breast, unspecified quadrant: Secondary | ICD-10-CM

## 2014-01-25 ENCOUNTER — Other Ambulatory Visit (HOSPITAL_COMMUNITY): Payer: Self-pay | Admitting: Obstetrics and Gynecology

## 2014-02-05 ENCOUNTER — Encounter (HOSPITAL_COMMUNITY): Payer: BC Managed Care – PPO

## 2015-01-21 ENCOUNTER — Other Ambulatory Visit: Payer: Self-pay | Admitting: *Deleted

## 2015-01-21 MED ORDER — MOMETASONE FURO-FORMOTEROL FUM 200-5 MCG/ACT IN AERO: 2.0000 | INHALATION_SPRAY | Freq: Two times a day (BID) | RESPIRATORY_TRACT | Status: DC

## 2015-01-22 MED FILL — DULERA 200 MCG/5 MCG INH: 200-5 | 30 days supply | Qty: 13 | Fill #0

## 2015-01-29 ENCOUNTER — Ambulatory Visit: Payer: BC Managed Care – PPO | Admitting: Allergy and Immunology

## 2015-01-31 MED FILL — LEVOTHYROXINE 137 MCG TAB: 137 | 30 days supply | Qty: 30 | Fill #0

## 2015-02-04 ENCOUNTER — Ambulatory Visit: Payer: BC Managed Care – PPO | Admitting: Allergy and Immunology

## 2015-02-07 ENCOUNTER — Encounter: Payer: Self-pay | Admitting: Family Medicine

## 2015-02-11 ENCOUNTER — Encounter: Payer: Self-pay | Admitting: Allergy and Immunology

## 2015-02-11 ENCOUNTER — Ambulatory Visit (INDEPENDENT_AMBULATORY_CARE_PROVIDER_SITE_OTHER): Payer: BC Managed Care – PPO | Admitting: Allergy and Immunology

## 2015-02-11 VITALS — BP 140/78 | HR 97 | Temp 98.0°F | Resp 12

## 2015-02-11 DIAGNOSIS — J4540 Moderate persistent asthma, uncomplicated: Secondary | ICD-10-CM

## 2015-02-11 DIAGNOSIS — J309 Allergic rhinitis, unspecified: Secondary | ICD-10-CM

## 2015-02-11 DIAGNOSIS — H1010 Acute atopic conjunctivitis, unspecified eye: Secondary | ICD-10-CM | POA: Diagnosis not present

## 2015-02-11 MED ORDER — MOMETASONE FURO-FORMOTEROL FUM 200-5 MCG/ACT IN AERO: 2.0000 | INHALATION_SPRAY | Freq: Two times a day (BID) | RESPIRATORY_TRACT | Status: DC

## 2015-02-11 MED ORDER — OLOPATADINE HCL 0.7 % OP SOLN: 1.0000 [drp] | Freq: Every day | OPHTHALMIC | Status: DC | PRN

## 2015-02-11 MED ORDER — FLUTICASONE PROPIONATE 50 MCG/ACT NA SUSP: 2.0000 | Freq: Every day | NASAL | Status: DC

## 2015-02-11 MED ORDER — MONTELUKAST SODIUM 10 MG PO TABS: 10.0000 mg | ORAL_TABLET | Freq: Every day | ORAL | Status: DC

## 2015-02-11 MED ORDER — ALBUTEROL SULFATE HFA 108 (90 BASE) MCG/ACT IN AERS: 2.0000 | INHALATION_SPRAY | RESPIRATORY_TRACT | Status: DC | PRN

## 2015-02-11 MED FILL — MONTELUKAST SOD 10 MG TAB: 10 | 30 days supply | Qty: 30 | Fill #0

## 2015-02-11 NOTE — Progress Notes (Signed)
FOLLOW UP NOTE  RE: [name] MRN: [id number] DOB: 1965/12/27 [facility] Veronica Reilly . [place], [place] [address] Date of Office Visit: 02/11/2015  Subjective:  Veronica Reilly is a 50 y.o. female who presents today for Medication Refill and Cough  Assessment:   1. Moderate persistent asthma, improved control.    2. Allergic rhinoconjunctivitis.   3.      Intermittent GE reflux. Plan:   Meds ordered this encounter  Medications  . albuterol (VENTOLIN HFA) 108 (90 Base) MCG/ACT inhaler    Sig: Inhale 2 puffs into the lungs every 4 (four) hours as needed.    Dispense:  1 Inhaler    Refill:  1  . mometasone-formoterol (DULERA) 200-5 MCG/ACT AERO    Sig: Inhale 2 puffs into the lungs 2 (two) times daily.    Dispense:  1 Inhaler    Refill:  3  . montelukast (SINGULAIR) 10 MG tablet    Sig: Take 1 tablet (10 mg total) by mouth at bedtime.    Dispense:  30 tablet    Refill:  3  . Olopatadine HCl (PAZEO) 0.7 % SOLN    Sig: Apply 1 drop to eye daily as needed.    Dispense:  1 Bottle    Refill:  5  . fluticasone (FLONASE) 50 MCG/ACT nasal spray    Sig: Place 2 sprays into both nostrils daily.    Dispense:  16 g    Refill:  5   Patient Instructions  1. Continue current medications--Flonase, Singulair, Allegra, Pazeo. ---Use Dulera 2 puffs twice daily for 10 days then decrease to previous dose. 2.  Ventolin 2 puffs every 4 hours as needed. 3.  Saline nasal wash 2-4 times daily. 4.  Call with additional questions or concerns. 5.  Follow-up in 4 month/s or sooner if needed. 6.  Info on GERD--review with primary current management and previous medication trials, 7.  Call back with decision on allergy injections.  HPI: Veronica Reilly returns to the office requesting refills of medications in follow-up of asthma and allergic rhinoconjunctivitis.  Since her last visit in July she found the increased Dulera beneficial but had not called back with a  update message as we discussed.  She reports she was doing so well, preferred to maintain her current regime.  She describes an acute illness 2 weeks ago with cough, congestion, and voice loss where she used her albuterol once a day for a few days.  There was no disrupted sleep or activity.  She feels she is greater than 95% improved without new concerns today.  She has noted intermittent reflux and recalls having used Nexium or Prilosec in the past.  She is still not sure her schedule will allow for initiating immunotherapy.  No current cough, wheeze, shortness of breath, difficulty breathing, congestion, postnasal drip, sneezing or itchy watery eyes.  Denies ED or urgent care visits, prednisone or antibiotic courses. Reports sleep and activity are normal.    [facility] has a current medication list which includes the following prescription(s): albuterol, cetirizine, cholecalciferol, epinephrine, fluticasone, folic acid, levothyroxine, mometasone-formoterol, montelukast, olopatadine hcl,and  triamcinolone cream.   Drug Allergies: Allergies  Allergen Reactions  . Apple Anaphylaxis  . Other Shortness Of Breath    CHERRIES, TREES, MOLD,DUST, COCKROACHES  . Povidone-Iodine Anaphylaxis  . Prunus Persica Hives  . Zocor [Simvastatin] Other (See Comments)    Alopecia  . Betadine [Povidone Iodine]   . Iohexol   .  Latex   . Peanut-Containing Drug Products   . Shellfish Allergy    Objective:   Filed Vitals:   02/11/15 1600  BP: 140/78  Pulse: 97  Temp: 98 F (36.7 C)  Resp: 12   SpO2 Readings from Last 1 Encounters:  02/11/15 97%   Physical Exam  Constitutional: She is well-developed, well-nourished, and in no distress.  HENT:  Head: Atraumatic.  Right Ear: Tympanic membrane and ear canal normal.  Left Ear: Tympanic membrane and ear canal normal.  Nose: Mucosal edema present. No rhinorrhea. No epistaxis.  Mouth/Throat: Oropharynx is clear and moist and mucous membranes are normal. No  oropharyngeal exudate, posterior oropharyngeal edema or posterior oropharyngeal erythema.  Neck: Neck supple.  Cardiovascular: Normal rate, S1 normal and S2 normal.   No murmur heard. Pulmonary/Chest: Effort normal. She has no wheezes. She has no rhonchi. She has no rales.  Lymphadenopathy:    She has no cervical adenopathy.   Diagnostics: Spirometry:  FVC 2.70--79%, FEV1  2.21--75%. (FEV1% 105).    Veronica Reilly M. [name], MD  cc: Lamar Blinks, MD

## 2015-02-11 NOTE — Patient Instructions (Signed)
Continue current medications--Flonase, Singulair, Allegra, Pazeo. ---Use Dulera 2 puffs twice daily for 10 days then decrease to previous dose.  Ventolin 2 puffs every 4 hours as needed.   Saline nasal wash 2-4 times daily.   Call with additional questions or concerns.   Follow-up in 4 month/s or sooner if needed.  Info on GERD--review with primary current management and previous medication trials,   Call back with decision on allergy injections.

## 2015-02-12 ENCOUNTER — Encounter: Payer: Self-pay | Admitting: Family Medicine

## 2015-02-12 MED FILL — ALLEGRA ALLERGY 180 MG TAB: 180 MG | 30 days supply | Qty: 30 | Fill #1

## 2015-02-14 MED FILL — DULERA 200 MCG/5 MCG INH: 200-5 | 30 days supply | Qty: 13 | Fill #0

## 2015-02-21 MED FILL — FOLIC ACID 1 MG TABLET: 1 | 30 days supply | Qty: 30 | Fill #3

## 2015-02-28 ENCOUNTER — Telehealth: Payer: Self-pay

## 2015-02-28 MED FILL — LEVOTHYROXINE 137 MCG TAB: 137 | 30 days supply | Qty: 30 | Fill #1

## 2015-02-28 NOTE — Telephone Encounter (Signed)
Patient was seen on 02/11/2015, and she is still having issues with coughing and a lot of congestion.  Hurts when she breaths in and out. She has used all medications and inhalers and nothing is improving.  Please Advise  Thanks   Veronica Reilly out Patient Pharmacy

## 2015-02-28 NOTE — Telephone Encounter (Signed)
Spoke with patient who has been coughing since yesterday. Nasal congestion and chest congestion started yesterday too. No fever or trouble breathing. Patient had not yet tried her albuterol inhaler to suggested to use that during coughing spells and also advised to continue her Dulera, and flonase along with a nasal saline. Advised patient that if she was still filling bad by Monday or Tuesday to call us back.

## 2015-02-28 NOTE — Telephone Encounter (Signed)
Called and left voicemail for patient to return phone call

## 2015-03-06 ENCOUNTER — Other Ambulatory Visit: Payer: Self-pay | Admitting: Neurology

## 2015-03-06 MED ORDER — TRIAMCINOLONE ACETONIDE 0.1 % EX CREA: 1.0000 "application " | TOPICAL_CREAM | Freq: Every day | CUTANEOUS | Status: DC | PRN

## 2015-03-06 MED FILL — TRIAMCINOLONE 0.1% CREAM: 0.1 | 20 days supply | Qty: 30 | Fill #0

## 2015-03-10 MED FILL — FLUTICASONE PROP 50 MCG SPR: 50 | 30 days supply | Qty: 16 | Fill #2

## 2015-03-11 ENCOUNTER — Telehealth: Payer: Self-pay | Admitting: Neurology

## 2015-03-11 MED ORDER — PREDNISONE 10 MG PO TABS: ORAL_TABLET | ORAL | Status: DC

## 2015-03-11 MED FILL — predniSONE 10 MG TABS: 10 | 5 days supply | Qty: 12 | Fill #0

## 2015-03-11 NOTE — Telephone Encounter (Signed)
Phone call to patient reports does not have a peanut allergy.  Eats peanut almost daily without issue, recalls at time of testing with [facility] approximately in 2014 had mildly reactive peanut test but in review with Dr. Harold Hedge and Dr. 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  Improved after last visit 100%.  Now has congestion,cough, without chest congestion/tightness, shortness of breath, wheeze, difficulty in breathing, headache or sorethroat.  Elevated temp on 2/10 resolved.     Took Mucinex and tylenol with improvement but intermittent persisting cough especially with activity.  Taking Allegra, Singulair, Flonase, as needed ProAir (less than once a day).  [facility].  Send Prednisone 10mg  tablets---take 30mg  daily for 3 days then 20mg  once daily, then 10mg  on last day.  Disp#: 12 no refills.  Patient is aware of new RX.

## 2015-03-11 NOTE — Telephone Encounter (Signed)
Patient said that she has been coughing since Feb 10. Patient is asking for something for the cough if possible. Patient has been taking mucinex, sudafed, and dayquil. Patient said that she was just seen on 02/11/15 and is asking if Dr [name] could send her something in verses coming in for an OV.

## 2015-03-11 NOTE — Telephone Encounter (Signed)
Prednisone sent to patient pharmacy.

## 2015-03-28 MED FILL — FOLIC ACID 1 MG TABLET: 1 | 30 days supply | Qty: 30 | Fill #4

## 2015-03-28 MED FILL — LEVOTHYROXINE 137 MCG TAB: 137 | 30 days supply | Qty: 30 | Fill #2

## 2015-03-28 MED FILL — PAZEO 0.7% EYE DROPS: 0.7 | 25 days supply | Qty: 3 | Fill #0

## 2015-03-28 MED FILL — MONTELUKAST SOD 10 MG TAB: 10 | 30 days supply | Qty: 30 | Fill #1

## 2015-04-11 MED FILL — FLUTICASONE PROP 50 MCG SPR: 50 | 30 days supply | Qty: 16 | Fill #3

## 2015-04-28 MED FILL — LEVOTHYROXINE 137 MCG TAB: 137 | 30 days supply | Qty: 30 | Fill #3

## 2015-04-28 MED FILL — FOLIC ACID 1 MG TABLET: 1 | 30 days supply | Qty: 30 | Fill #5

## 2015-04-28 MED FILL — MONTELUKAST SOD 10 MG TAB: 10 | 30 days supply | Qty: 30 | Fill #2

## 2015-05-19 MED FILL — DULERA 200 MCG/5 MCG INH: 200-5 | 30 days supply | Qty: 13 | Fill #1

## 2015-05-28 MED FILL — LEVOTHYROXINE 137 MCG TAB: 137 | 30 days supply | Qty: 30 | Fill #4

## 2015-05-28 MED FILL — FOLIC ACID 1 MG TABLET: 1 | 30 days supply | Qty: 30 | Fill #6

## 2015-05-28 MED FILL — MONTELUKAST SOD 10 MG TAB: 10 | 30 days supply | Qty: 30 | Fill #3

## 2015-05-29 MED FILL — FLUTICASONE PROP 50 MCG SPR: 50 | 30 days supply | Qty: 16 | Fill #0

## 2015-06-20 MED FILL — LEVOTHYROXINE 137 MCG TAB: 137 | 90 days supply | Qty: 90 | Fill #0

## 2015-06-30 ENCOUNTER — Other Ambulatory Visit: Payer: Self-pay | Admitting: Allergy and Immunology

## 2015-06-30 MED FILL — MONTELUKAST SOD 10 MG TAB: 10 | 30 days supply | Qty: 30 | Fill #0

## 2015-06-30 MED FILL — DULERA 200 MCG/5 MCG INH: 200-5 | 30 days supply | Qty: 13 | Fill #2

## 2015-06-30 MED FILL — FOLIC ACID 1 MG TABLET: 1 | 30 days supply | Qty: 30 | Fill #7

## 2015-07-01 MED FILL — FLUTICASONE PROP 50 MCG SPR: 50 | 30 days supply | Qty: 16 | Fill #1

## 2015-07-04 MED FILL — PROAIR HFA 90 MCG INHALER: 108 (90 BAS | 15 days supply | Qty: 9 | Fill #0

## 2015-07-07 MED FILL — PAZEO 0.7% EYE DROPS: 0.7 | 25 days supply | Qty: 3 | Fill #1

## 2015-07-23 ENCOUNTER — Encounter: Payer: Self-pay | Admitting: Allergy and Immunology

## 2015-07-23 ENCOUNTER — Ambulatory Visit (INDEPENDENT_AMBULATORY_CARE_PROVIDER_SITE_OTHER): Payer: BC Managed Care – PPO | Admitting: Allergy and Immunology

## 2015-07-23 VITALS — BP 130/82 | HR 92 | Temp 98.1°F | Resp 24 | Ht 66.5 in | Wt 230.2 lb

## 2015-07-23 DIAGNOSIS — J4540 Moderate persistent asthma, uncomplicated: Secondary | ICD-10-CM

## 2015-07-23 DIAGNOSIS — J309 Allergic rhinitis, unspecified: Secondary | ICD-10-CM | POA: Diagnosis not present

## 2015-07-23 DIAGNOSIS — H1010 Acute atopic conjunctivitis, unspecified eye: Secondary | ICD-10-CM | POA: Diagnosis not present

## 2015-07-23 DIAGNOSIS — H6501 Acute serous otitis media, right ear: Secondary | ICD-10-CM

## 2015-07-23 MED ORDER — AMOXICILLIN 875 MG PO TABS: 875.0000 mg | ORAL_TABLET | Freq: Two times a day (BID) | ORAL | Status: DC

## 2015-07-23 NOTE — Patient Instructions (Addendum)
   Complete Amoxil 875mg  twice daily for 10 days.  Saline nasal wash 2-4 times daily.  Continue Flonase, Dulera, Allegra, Singulair and Pazeo.  Prednisone 30mg  once daily for 3 days and 20mg  once daily for 2 days and 10mg  on last day.  ProAir 2 puffs every 4 hours as needed for cough or wheeze.  Follow-up in 4 months or sooner if needed.  Received influenzae vaccine this fall season through primary MD.

## 2015-07-23 NOTE — Progress Notes (Signed)
FOLLOW UP NOTE  RE: Sherby Madruga MRN: [id number] DOB: 1965/05/16 [facility] [address] [id number] Date of Office Visit: 07/23/2015  Subjective:  Chalonda Koda is a 50 y.o. female who presents today for Cough; Shortness of Breath; and Fever  Assessment:   1. Moderate persistent asthma, mild excerabation in no respiratory distress.  2. Allergic rhinoconjunctivitis   3. Right acute serous otitis media, recurrence not specified    Plan:   Meds ordered this encounter  Medications  . amoxicillin (AMOXIL) 875 MG tablet    Sig: Take 1 tablet (875 mg total) by mouth 2 (two) times daily.    Dispense:  20 tablet    Refill:  0  . albuterol (VENTOLIN HFA) 108 (90 Base) MCG/ACT inhaler    Sig: Use 2 puffs every four hours as needed for cough or wheeze.  May use 2 puffs 10-20 minutes prior to exercise.    Dispense:  1 Inhaler    Refill:  1  . EPINEPHrine (EPIPEN 2-PAK) 0.3 mg/0.3 mL IJ SOAJ injection    Sig: Use as directed for a threatening allergic reaction    Dispense:  4 Device    Refill:  1  . fluticasone (FLONASE) 50 MCG/ACT nasal spray    Sig: Use 2 sprays in each nostril once daily for stuffy nose or drainage.    Dispense:  16 g    Refill:  5  . mometasone-formoterol (DULERA) 200-5 MCG/ACT AERO    Sig: Use 2 puffs twice daily to prevent cough or wheeze.  Rinse, gargle, and spit after use.    Dispense:  1 Inhaler    Refill:  3  . montelukast (SINGULAIR) 10 MG tablet    Sig: Take one tablet each evening to prevent cough or wheeze.    Dispense:  30 tablet    Refill:  5  . Olopatadine HCl 0.6 % SOLN    Sig: Use 1-2 sprays in each nostril once daily in the evening for congestion.    Dispense:  1 Bottle    Refill:  3  1.  Complete Amoxil 875mg  twice daily for 10 days. 2.  Saline nasal wash 2-4 times daily. 3.  Continue Flonase, Dulera, Allegra, Singulair and Pazeo. 4.  Prednisone 30mg  once daily for 3 days and  20mg  once daily for 2 days and 10mg  on last day. 5.  ProAir 2 puffs every 4 hours as needed for cough or wheeze. 6.  Follow-up in 4 months or sooner if needed. 7.  Received influenzae vaccine this fall season through primary MD.  HPI: Lorane returns to the office regarding cough, congestion and raspy voice.  She reports her symptoms began approximately a week ago with throat/eye irritation, rhinorrhea and sneezing.  She now has noted increasing cough with chest congestion and right ear pressure without wheezing.  She is using ProAir 4 times a day but reports sleep is okay.  She has not been seen in office since January 2017 as missed follow-up, but reports being consistent with her maintenance medications (Dulera, Allegra, Singulair) and increasing Flonase to twice daily with current symptoms.  Denies ED or urgent care visits, prednisone or antibiotic courses. Reports sactivity are normal.  [facility] has a current medication list which includes the following prescription(s): albuterol, cholecalciferol, epinephrine, fexofenadine, fluticasone, folic acid, levothyroxine, mometasone-formoterol, montelukast, multivitamin triamcinolone cream.  Drug Allergies: Allergies  Allergen Reactions  . Apple Anaphylaxis  . Other Shortness Of Breath  CHERRIES, TREES, MOLD,DUST, COCKROACHES  . Shellfish Allergy Anaphylaxis  . Peach [Prunus Persica] Itching    mouth  . Zocor [Simvastatin] Other (See Comments)    Alopecia  . Iohexol     Contrast dye  . Latex Rash   Objective:   Filed Vitals:   07/23/15 1344  BP: 130/82  Pulse: 92  Temp: 98.1 F (36.7 C)  Resp: 24   SpO2 Readings from Last 1 Encounters:  07/23/15 97%   Physical Exam  Constitutional: She is well-developed, well-nourished, and in no distress.  HENT:  Head: Atraumatic.  Right Ear: Tympanic membrane and ear canal normal.  Left Ear: Tympanic membrane and ear canal normal.  Nose: Mucosal edema present. No rhinorrhea. No epistaxis.    Mouth/Throat: Oropharynx is clear and moist and mucous membranes are normal. No oropharyngeal exudate, posterior oropharyngeal edema or posterior oropharyngeal erythema.  Neck: Neck supple.  Cardiovascular: Normal rate, S1 normal and S2 normal.   No murmur heard. Pulmonary/Chest: Effort normal. She has no wheezes. She has no rhonchi. She has no rales.  Post Xopenex/Atrovent neb: Continues to be clear.  Patient without wheeze, rhonchi or crackles.  Lymphadenopathy:    She has no cervical adenopathy.   Diagnostics: Spirometry:  FVC  2.30--77%, FEV1 1.87--76%; postbronchodilator essentially no change.    Jaydalynn Olivero M. [name], MD  cc: Lamar Blinks, MD

## 2015-07-24 MED FILL — AMOXICILLIN 875 MG TABLET: 875 | 10 days supply | Qty: 20 | Fill #0

## 2015-07-25 MED ORDER — EPINEPHRINE 0.3 MG/0.3ML IJ SOAJ: INTRAMUSCULAR | Status: DC

## 2015-07-25 MED ORDER — OLOPATADINE HCL 0.6 % NA SOLN: NASAL | Status: DC

## 2015-07-25 MED ORDER — MONTELUKAST SODIUM 10 MG PO TABS: ORAL_TABLET | ORAL | Status: DC

## 2015-07-25 MED ORDER — FLUTICASONE PROPIONATE 50 MCG/ACT NA SUSP: NASAL | Status: DC

## 2015-07-25 MED ORDER — ALBUTEROL SULFATE HFA 108 (90 BASE) MCG/ACT IN AERS: INHALATION_SPRAY | RESPIRATORY_TRACT | Status: DC

## 2015-07-25 MED ORDER — MOMETASONE FURO-FORMOTEROL FUM 200-5 MCG/ACT IN AERO: INHALATION_SPRAY | RESPIRATORY_TRACT | Status: DC

## 2015-07-28 MED FILL — FLUTICASONE PROP 50 MCG SPR: 50 | 30 days supply | Qty: 16 | Fill #0

## 2015-07-28 MED FILL — DULERA 200 MCG/5 MCG INH: 200-5 | 30 days supply | Qty: 13 | Fill #0

## 2015-08-08 MED FILL — MONTELUKAST SOD 10 MG TAB: 10 | 30 days supply | Qty: 30 | Fill #1

## 2015-08-11 MED FILL — FOLIC ACID 1 MG TABLET: 1 | 30 days supply | Qty: 30 | Fill #8

## 2015-09-10 MED FILL — MONTELUKAST SOD 10 MG TAB: 10 | 30 days supply | Qty: 30 | Fill #0

## 2015-09-10 MED FILL — FOLIC ACID 1 MG TABLET: 1 | 30 days supply | Qty: 30 | Fill #9

## 2015-09-17 MED FILL — DULERA 200 MCG/5 MCG INH: 200-5 | 30 days supply | Qty: 13 | Fill #1

## 2015-09-29 MED FILL — LEVOTHYROXINE 137 MCG TAB: 137 | 90 days supply | Qty: 90 | Fill #1

## 2015-10-16 MED FILL — DULERA 200 MCG/5 MCG INH: 200-5 | 30 days supply | Qty: 13 | Fill #2

## 2015-10-16 MED FILL — MONTELUKAST SOD 10 MG TAB: 10 | 30 days supply | Qty: 30 | Fill #1

## 2015-10-16 MED FILL — FLUTICASONE PROP 50 MCG SPR: 50 | 30 days supply | Qty: 16 | Fill #1

## 2015-10-16 MED FILL — FOLIC ACID 1 MG TABLET: 1 | 30 days supply | Qty: 30 | Fill #10

## 2015-10-16 MED FILL — AMOXICILLIN 875 MG TABLET: 875 | 7 days supply | Qty: 14 | Fill #0

## 2015-10-24 ENCOUNTER — Ambulatory Visit: Payer: BC Managed Care – PPO | Admitting: Podiatry

## 2015-10-31 MED FILL — TRIAMCINOLONE 0.1% CREAM: 0.1 | 20 days supply | Qty: 30 | Fill #1

## 2015-11-05 ENCOUNTER — Other Ambulatory Visit: Payer: Self-pay | Admitting: Family Medicine

## 2015-11-05 ENCOUNTER — Ambulatory Visit
Admission: RE | Admit: 2015-11-05 | Discharge: 2015-11-05 | Disposition: A | Discharge status: Home / Self Care | Payer: BC Managed Care – PPO | Source: Ambulatory Visit | Attending: Family Medicine | Admitting: Family Medicine

## 2015-11-05 DIAGNOSIS — M7731 Calcaneal spur, right foot: Secondary | ICD-10-CM

## 2015-11-05 DIAGNOSIS — M7732 Calcaneal spur, left foot: Secondary | ICD-10-CM

## 2015-11-20 MED FILL — MONTELUKAST SOD 10 MG TAB: 10 | 30 days supply | Qty: 30 | Fill #2

## 2015-12-16 ENCOUNTER — Other Ambulatory Visit: Payer: Self-pay | Admitting: Obstetrics and Gynecology

## 2015-12-16 DIAGNOSIS — N6310 Unspecified lump in the right breast, unspecified quadrant: Secondary | ICD-10-CM

## 2015-12-17 MED FILL — FOLIC ACID 1 MG TABLET: 1 | 30 days supply | Qty: 30 | Fill #0

## 2015-12-17 MED FILL — DULERA 200 MCG/5 MCG INH: 200-5 | 30 days supply | Qty: 13 | Fill #3

## 2015-12-17 MED FILL — LEVOTHYROXINE 137 MCG TAB: 137 | 30 days supply | Qty: 30 | Fill #2

## 2015-12-17 MED FILL — FLUTICASONE PROP 50 MCG SPR: 50 | 30 days supply | Qty: 16 | Fill #2

## 2015-12-17 MED FILL — MONTELUKAST SOD 10 MG TAB: 10 | 30 days supply | Qty: 30 | Fill #3

## 2015-12-26 ENCOUNTER — Other Ambulatory Visit: Payer: BC Managed Care – PPO

## 2016-01-08 ENCOUNTER — Other Ambulatory Visit: Payer: BC Managed Care – PPO

## 2016-01-15 MED FILL — FLUTICASONE PROP 50 MCG SPR: 50 | 30 days supply | Qty: 16 | Fill #3

## 2016-01-20 ENCOUNTER — Ambulatory Visit
Admission: RE | Admit: 2016-01-20 | Discharge: 2016-01-20 | Disposition: A | Discharge status: Home / Self Care | Payer: BC Managed Care – PPO | Source: Ambulatory Visit | Attending: Obstetrics and Gynecology | Admitting: Obstetrics and Gynecology

## 2016-01-20 ENCOUNTER — Other Ambulatory Visit: Payer: Self-pay | Admitting: Obstetrics and Gynecology

## 2016-01-20 ENCOUNTER — Other Ambulatory Visit: Payer: BC Managed Care – PPO

## 2016-01-20 DIAGNOSIS — N6310 Unspecified lump in the right breast, unspecified quadrant: Secondary | ICD-10-CM

## 2016-01-22 ENCOUNTER — Ambulatory Visit
Admission: RE | Admit: 2016-01-22 | Discharge: 2016-01-22 | Disposition: A | Discharge status: Home / Self Care | Payer: BC Managed Care – PPO | Source: Ambulatory Visit | Attending: Obstetrics and Gynecology | Admitting: Obstetrics and Gynecology

## 2016-01-22 ENCOUNTER — Other Ambulatory Visit: Payer: Self-pay | Admitting: Obstetrics and Gynecology

## 2016-01-22 DIAGNOSIS — N6310 Unspecified lump in the right breast, unspecified quadrant: Secondary | ICD-10-CM

## 2016-01-23 ENCOUNTER — Telehealth: Payer: Self-pay | Admitting: Allergy and Immunology

## 2016-01-23 MED FILL — MONTELUKAST SOD 10 MG TAB: 10 | 30 days supply | Qty: 30 | Fill #4

## 2016-01-23 MED FILL — LEVOTHYROXINE 137 MCG TAB: 137 | 30 days supply | Qty: 30 | Fill #3

## 2016-01-23 MED FILL — FOLIC ACID 1 MG TABLET: 1 | 30 days supply | Qty: 30 | Fill #1

## 2016-01-23 NOTE — Telephone Encounter (Signed)
Pt went to pharmacy and they told her that her ins would not cover Dulera  Could we give her something else. [place] [facility] [phone].

## 2016-01-26 ENCOUNTER — Ambulatory Visit (INDEPENDENT_AMBULATORY_CARE_PROVIDER_SITE_OTHER): Payer: BC Managed Care – PPO | Admitting: Allergy

## 2016-01-26 ENCOUNTER — Encounter: Payer: Self-pay | Admitting: Allergy

## 2016-01-26 VITALS — BP 138/80 | HR 94 | Temp 97.6°F | Resp 20 | Ht 65.0 in | Wt 232.4 lb

## 2016-01-26 DIAGNOSIS — H1010 Acute atopic conjunctivitis, unspecified eye: Secondary | ICD-10-CM | POA: Diagnosis not present

## 2016-01-26 DIAGNOSIS — J309 Allergic rhinitis, unspecified: Secondary | ICD-10-CM | POA: Diagnosis not present

## 2016-01-26 DIAGNOSIS — J4540 Moderate persistent asthma, uncomplicated: Secondary | ICD-10-CM

## 2016-01-26 MED ORDER — BUDESONIDE-FORMOTEROL FUMARATE 160-4.5 MCG/ACT IN AERO: 2.0000 | INHALATION_SPRAY | Freq: Two times a day (BID) | RESPIRATORY_TRACT | 5 refills | Status: DC

## 2016-01-26 MED ORDER — OLOPATADINE HCL 0.7 % OP SOLN: 1.0000 [drp] | Freq: Every day | OPHTHALMIC | 5 refills | Status: DC | PRN

## 2016-01-26 MED ORDER — FLUTICASONE PROPIONATE 50 MCG/ACT NA SUSP: NASAL | 5 refills | Status: DC

## 2016-01-26 MED FILL — PAZEO 0.7% EYE DROPS: 0.7 | 25 days supply | Qty: 3 | Fill #0

## 2016-01-26 NOTE — Patient Instructions (Addendum)
  Saline nasal wash daily as needed.  Continue Flonase 1-2 sprays each nostril as needed for congestion.  Use for 1-2 weeks at a time before stopping.  Use olopatadine nasal spray 2 sprays twice a day for nasal drainage/post nasal drip.   Use Dulera sample until out.  Will replace with Symbicort 130mcg 2 puffs twice a day.    Continue Allegra 180mg  or zyrtec 10mg  daily  Continue Singulair 10mg  daily  Continue Pazeo 1 drop each eye as needed for itchy/watery/red eyes.  ProAir 2 puffs every 4 hours as needed for cough or wheeze.  Follow-up in 4 months or sooner if needed.   Hold your antihistamine 3 days prior to this visit for skin testing.

## 2016-01-26 NOTE — Telephone Encounter (Signed)
Spoke to patient and she is going to schedule and appointment and I gave her a sample of dulera.

## 2016-01-26 NOTE — Progress Notes (Signed)
Follow-up Note  RE: Veronica Reilly MRN: [id number] DOB: [birth year] Date of Office Visit: 01/26/2016   History of present illness: Veronica Reilly is a 51 y.o. female presenting today for follow-up of asthma, allergic rhinoconjunctivitis.  She was last seen in 07/23/15 by Dr. [name].   She returns today as her insurance is no longer covering some of her medications.  She states she has been in good health without any changes with her health, need for surgeries or hospitalizations. At her last visit she was treated for an ear infection with antibiotic and steroid. She has had 1 cold since last visit.    She tried to refill her Ruthe Mannan with her pharmacy but her insurance no longer covers it. She has tried to make the several puffs she has left stretch until this appointment. She did need to use her albuterol this morning. SAlbuterol use is less than once a month unless she is in activity.  he continues on Singulair. She has Flonase but has not needed to use it. She has received a prescription for olopatadine nasal spray in the past however she does not have this. She also has Pazeo for as needed use.  She had additional questions regarding restarting allergy shots today.    Review of systems: Review of Systems  Constitutional: Negative for chills, fever and malaise/fatigue.  HENT: Positive for congestion. Negative for ear pain, nosebleeds, sinus pain and sore throat.   Eyes: Negative for discharge and redness.  Respiratory: Positive for cough. Negative for shortness of breath and wheezing.   Cardiovascular: Negative for chest pain.  Gastrointestinal: Negative for abdominal pain, heartburn, nausea and vomiting.  Skin: Negative for itching and rash.    All other systems negative unless noted above in HPI  Past medical/social/surgical/family history have been reviewed and are unchanged unless specifically indicated below.  No changes  Medication List: Allergies as of 01/26/2016    Reactions   Apple Anaphylaxis   Other Shortness Of Breath   CHERRIES, TREES, MOLD,DUST, COCKROACHES   Povidone-iodine Anaphylaxis   Shellfish Allergy Anaphylaxis   Peach [prunus Persica] Itching, Hives   mouth   Zocor [simvastatin] Other (See Comments)   Alopecia   Betadine [povidone Iodine]    Due to shellfish allergy   Iohexol    Contrast dye   Latex Rash      Medication List       Accurate as of 01/26/16  5:04 PM. Always use your most recent med list.          albuterol 108 (90 Base) MCG/ACT inhaler Commonly known as:  VENTOLIN HFA Use 2 puffs every four hours as needed for cough or wheeze.  May use 2 puffs 10-20 minutes prior to exercise.   cetirizine 10 MG tablet Commonly known as:  ZYRTEC Take 10 mg by mouth daily.   cholecalciferol 1000 units tablet Commonly known as:  VITAMIN D Take 2,000 Units by mouth daily.   EPINEPHrine 0.3 mg/0.3 mL Soaj injection Commonly known as:  EPIPEN 2-PAK Use as directed for a threatening allergic reaction   fexofenadine 180 MG tablet Commonly known as:  ALLEGRA Take 180 mg by mouth daily.   fluticasone 50 MCG/ACT nasal spray Commonly known as:  FLONASE Use 2 sprays in each nostril once daily for stuffy nose or drainage.   folic acid 1 MG tablet Commonly known as:  FOLVITE Take 1 mg by mouth daily.   levothyroxine 137 MCG tablet Commonly known as:  SYNTHROID, LEVOTHROID  Take 1 tablet by mouth daily. Reported on 03/11/2015   mometasone-formoterol 200-5 MCG/ACT Aero Commonly known as:  DULERA Use 2 puffs twice daily to prevent cough or wheeze.  Rinse, gargle, and spit after use.   montelukast 10 MG tablet Commonly known as:  SINGULAIR Take one tablet each evening to prevent cough or wheeze.   multivitamin tablet Take 1 tablet by mouth daily. Reported on 02/11/2015   Olopatadine HCl 0.7 % Soln Commonly known as:  PAZEO Apply 1 drop to eye daily as needed.   Olopatadine HCl 0.6 % Soln Use 1-2 sprays in each nostril  once daily in the evening for congestion.   PATANASE NA Place into the nose. Reported on 03/11/2015   triamcinolone cream 0.1 % Commonly known as:  KENALOG Apply 1 application topically daily as needed.       Known medication allergies: Allergies  Allergen Reactions  . Apple Anaphylaxis  . Other Shortness Of Breath    CHERRIES, TREES, MOLD,DUST, COCKROACHES  . Povidone-Iodine Anaphylaxis  . Shellfish Allergy Anaphylaxis  . Peach [Prunus Persica] Itching and Hives    mouth  . Zocor [Simvastatin] Other (See Comments)    Alopecia  . Betadine [Povidone Iodine]     Due to shellfish allergy  . Iohexol     Contrast dye  . Latex Rash     Physical examination: Blood pressure 138/80, pulse 94, temperature 97.6 F (36.4 C), temperature source Oral, resp. rate 20, height 5\' 5"  (1.651 m), weight 232 lb 6.4 oz (105.4 kg), SpO2 98 %.  General: Alert, interactive, in no acute distress. HEENT: TMs pearly gray, turbinates mildly edematous without discharge, post-pharynx non erythematous. Neck: Supple without lymphadenopathy. Lungs: Clear to auscultation without wheezing, rhonchi or rales. {no increased work of breathing. CV: Normal S1, S2 without murmurs. Abdomen: Nondistended, nontender. Skin: Warm and dry, without lesions or rashes. Extremities:  No clubbing, cyanosis or edema. Neuro:   Grossly intact.  Diagnositics/Labs:  Spirometry: FEV1: 1.88L  8%, FVC: 2.33L  88%, ratio consistent with Nonobstructive pattern  Assessment and plan:   Moderate persistent asthma - Use Dulera sample until out.  Will replace with Symbicort 162mcg 2 puffs twice a day.  - ProAir 2 puffs every 4 hours as needed for cough or wheeze. - Continue Singulair 10mg  daily Asthma control goals:   Full participation in all desired activities (may need albuterol before activity)  Albuterol use two time or less a week on average (not counting use with activity)  Cough interfering with sleep two time or  less a month  Oral steroids no more than once a year  No hospitalizations   Allergic rhinoconjunctivitis - Saline nasal wash daily as needed. - Continue Flonase 1-2 sprays each nostril as needed for congestion.  Use for 1-2 weeks at a time before stopping. - Use olopatadine nasal spray 2 sprays twice a day for nasal drainage/post nasal drip.  - Continue Allegra 180mg  or zyrtec 10mg  daily - Continue Pazeo 1 drop each eye as needed for itchy/watery/red eyes. - Discussed allergen immunotherapy again today. We'll need to have repeat skin testing done prior to restarting. We'll plan to do this at her next visit. Advised that she holds her antihistamines 3 days prior. She will check with her insurance regarding allergy shot coverage  Follow-up in 4 months or sooner if needed.      I appreciate the opportunity to take part in [facility] care. Please do not hesitate to contact me with questions.  Sincerely,  Prudy Feeler, MD Allergy/Immunology [facility]

## 2016-01-27 MED FILL — VALSARTAN-HCTZ 80-12.5 MG T: 80-12.5 | 30 days supply | Qty: 30 | Fill #0

## 2016-02-06 MED FILL — OLOPATADINE 665 MCG NASAL S: 0.6 | 30 days supply | Qty: 31 | Fill #0

## 2016-02-19 MED FILL — SYMBICORT 160-4.5 MCG INH: 160-4.5 | 30 days supply | Qty: 10 | Fill #0

## 2016-02-24 MED FILL — MONTELUKAST SOD 10 MG TAB: 10 | 30 days supply | Qty: 30 | Fill #5

## 2016-02-24 MED FILL — FOLIC ACID 1 MG TABLET: 1 | 30 days supply | Qty: 30 | Fill #0

## 2016-02-24 MED FILL — VALSARTAN-HCTZ 80-12.5 MG T: 80-12.5 | 30 days supply | Qty: 30 | Fill #1

## 2016-02-24 MED FILL — LEVOTHYROXINE 137 MCG TAB: 137 | 30 days supply | Qty: 30 | Fill #4

## 2016-02-25 MED FILL — FLUTICASONE PROP 50 MCG SPR: 50 | 30 days supply | Qty: 16 | Fill #4

## 2016-03-26 MED FILL — LEVOTHYROXINE 137 MCG TAB: 137 | 30 days supply | Qty: 30 | Fill #5

## 2016-03-29 MED FILL — FOLIC ACID 1 MG TABLET: 1 | 30 days supply | Qty: 30 | Fill #1

## 2016-03-29 MED FILL — SYMBICORT 160-4.5 MCG INH: 160-4.5 | 30 days supply | Qty: 10 | Fill #1

## 2016-03-30 ENCOUNTER — Other Ambulatory Visit: Payer: Self-pay | Admitting: *Deleted

## 2016-03-30 MED ORDER — MONTELUKAST SODIUM 10 MG PO TABS: ORAL_TABLET | ORAL | 2 refills | Status: DC

## 2016-03-31 MED FILL — MONTELUKAST SOD 10 MG TAB: 10 | 30 days supply | Qty: 30 | Fill #0

## 2016-04-01 MED FILL — VALSARTAN-HCTZ 80-12.5 MG T: 80-12.5 | 30 days supply | Qty: 30 | Fill #0

## 2016-04-26 MED FILL — VALSARTAN-HCTZ 80-12.5 MG T: 80-12.5 | 30 days supply | Qty: 30 | Fill #1

## 2016-04-26 MED FILL — FLUTICASONE PROP 50 MCG SPR: 50 | 30 days supply | Qty: 16 | Fill #5

## 2016-04-26 MED FILL — LEVOTHYROXINE 137 MCG TAB: 137 | 30 days supply | Qty: 30 | Fill #6

## 2016-04-26 MED FILL — SYMBICORT 160-4.5 MCG INH: 160-4.5 | 30 days supply | Qty: 10 | Fill #2

## 2016-04-26 MED FILL — FOLIC ACID 1 MG TABLET: 1 | 30 days supply | Qty: 30 | Fill #2

## 2016-04-26 MED FILL — MONTELUKAST SOD 10 MG TAB: 10 | 30 days supply | Qty: 30 | Fill #1

## 2016-05-25 MED FILL — DEXAMETHASONE 4 MG TABLET: 4 | 12 days supply | Qty: 15 | Fill #0

## 2016-05-25 MED FILL — LEVOTHYROXINE 137 MCG TAB: 137 | 30 days supply | Qty: 30 | Fill #7

## 2016-05-26 MED FILL — FOLIC ACID 1 MG TABLET: 1 | 30 days supply | Qty: 30 | Fill #3

## 2016-06-02 MED FILL — MONTELUKAST SOD 10 MG TAB: 10 | 30 days supply | Qty: 30 | Fill #2

## 2016-06-03 ENCOUNTER — Emergency Department (HOSPITAL_BASED_OUTPATIENT_CLINIC_OR_DEPARTMENT_OTHER)
Admission: EM | Admit: 2016-06-03 | Discharge: 2016-06-03 | Disposition: A | Discharge status: Home / Self Care | Payer: BC Managed Care – PPO | Source: Home / Self Care

## 2016-06-03 ENCOUNTER — Emergency Department (HOSPITAL_COMMUNITY)
Admission: EM | Admit: 2016-06-03 | Discharge: 2016-06-04 | Disposition: A | Discharge status: Home / Self Care | Payer: BC Managed Care – PPO | Attending: Emergency Medicine | Admitting: Emergency Medicine

## 2016-06-03 ENCOUNTER — Encounter (HOSPITAL_COMMUNITY): Payer: Self-pay | Admitting: Emergency Medicine

## 2016-06-03 ENCOUNTER — Encounter (HOSPITAL_BASED_OUTPATIENT_CLINIC_OR_DEPARTMENT_OTHER): Payer: Self-pay

## 2016-06-03 DIAGNOSIS — T7840XA Allergy, unspecified, initial encounter: Secondary | ICD-10-CM

## 2016-06-03 DIAGNOSIS — J45909 Unspecified asthma, uncomplicated: Secondary | ICD-10-CM

## 2016-06-03 DIAGNOSIS — Z91040 Latex allergy status: Secondary | ICD-10-CM | POA: Insufficient documentation

## 2016-06-03 DIAGNOSIS — R21 Rash and other nonspecific skin eruption: Secondary | ICD-10-CM

## 2016-06-03 DIAGNOSIS — Z79899 Other long term (current) drug therapy: Secondary | ICD-10-CM | POA: Insufficient documentation

## 2016-06-03 DIAGNOSIS — I10 Essential (primary) hypertension: Secondary | ICD-10-CM | POA: Insufficient documentation

## 2016-06-03 DIAGNOSIS — Z5321 Procedure and treatment not carried out due to patient leaving prior to being seen by health care provider: Secondary | ICD-10-CM

## 2016-06-03 DIAGNOSIS — E039 Hypothyroidism, unspecified: Secondary | ICD-10-CM | POA: Insufficient documentation

## 2016-06-03 HISTORY — DX: Essential (primary) hypertension: I10

## 2016-06-03 MED ORDER — PREDNISONE 20 MG PO TABS: 40.0000 mg | ORAL_TABLET | Freq: Every day | ORAL | 0 refills | Status: DC

## 2016-06-03 MED ORDER — PREDNISONE 20 MG PO TABS
40.0000 mg | ORAL_TABLET | Freq: Once | ORAL | Status: AC
  Administered 2016-06-04: 40 mg via ORAL
  Filled 2016-06-03: qty 2

## 2016-06-03 NOTE — Discharge Instructions (Signed)
It was my pleasure taking care of you today!   Benadryl as needed for itching. Take prednisone as directed.  It is very important that you follow up with a primary care provider for further discussion about today's visit and your blood pressure medications.   Return to the ER for difficulty breathing, swelling to tongue/mouth/lips, fevers, new or worsening symptoms, any additional concerns.

## 2016-06-03 NOTE — ED Triage Notes (Signed)
Pt states she has had hives, itching and facial burning   Pt was given a dexamethasone 8 day dose pack and when it was complete the reaction came back  Pt states for the past 2 days she has had some shortness of breath esp on exertion Pt states she started a new blood pressure medication about 3 months ago and that is the only thing that is different

## 2016-06-03 NOTE — ED Triage Notes (Signed)
Pt c/o rash to face for the last several weeks, has been on many meds and the rash goes away, but it comes back.  Pt is going out of town tomorrow so she "wants it checked out."

## 2016-06-03 NOTE — ED Provider Notes (Signed)
[facility] DEPT Provider Note   CSN: [id number] Arrival date & time: 06/03/16  2250     History   Chief Complaint Chief Complaint  Patient presents with  . Allergic Reaction    HPI Veronica Reilly is a 51 y.o. female.  The history is provided by the patient and medical records. No language interpreter was used.  Allergic Reaction  Presenting symptoms: rash    Veronica Reilly is a 51 y.o. female  with a PMH of asthma, eczema, HTN, hypothyroid who presents to the Emergency Department complaining of rash to her face x 2 weeks. Patient states that at onset of symptoms, she saw her primary care provider who started her on dexamethasone 8 day taper. She took this as directed and symptoms resolved. She has not taken anything for two days now and rash is starting to return. She describes rash as pruritic darkened areas of skin. No swelling, open wounds, drainage or raised lesions. No fevers/chills. No oral swelling. No facial numbness/tingling. No wheezing or shortness of breath. Has been taking benadryl every now and then as needed for itching. No new soaps, lotions, detergents, etc. Has only been using dove sensitive soap and water on her face. No new make ups. She started a new BP medication (losartan-hctz) three months ago and feels this may be cause of symptoms. No sxs until two weeks ago. She discussed this with PCP at initial visit who told her to continue taking BP medication daily.   Past Medical History:  Diagnosis Date  . Anemia   . ASCUS (atypical squamous cells of undetermined significance) on Pap smear   . Asthma   . Dysplasia of cervix, low grade (CIN 1)   . Eczema   . History of chicken pox   . Hypertension   . Hypothyroid   . Uterine fibroid     Patient Active Problem List   Diagnosis Date Noted  . 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 Hypothyroidism 02/09/2012  . Fibroids 02/09/2012  . Dysplasia of cervix, low grade (CIN 1) 09/21/2011    Past  Surgical History:  Procedure Laterality Date  . ADENOIDECTOMY    . LEEP    . MYOMECTOMY    . TONSILECTOMY, ADENOIDECTOMY, BILATERAL MYRINGOTOMY AND TUBES    . TONSILLECTOMY      OB History    Gravida Para Term Preterm AB Living   0 0 0 0 0 0   SAB TAB Ectopic Multiple Live Births   0 0 0 0         Home Medications    Prior to Admission medications   Medication Sig Start Date End Date Taking? Authorizing Provider  albuterol (VENTOLIN HFA) 108 (90 Base) MCG/ACT inhaler Use 2 puffs every four hours as needed for cough or wheeze.  May use 2 puffs 10-20 minutes prior to exercise. 07/25/15   Gean Quint, MD  budesonide-formoterol [facility]) 160-4.5 MCG/ACT inhaler Inhale 2 puffs into the lungs 2 (two) times daily. 01/26/16   Kennith Gain, MD  cetirizine (ZYRTEC) 10 MG tablet Take 10 mg by mouth daily.    [provider]  cholecalciferol (VITAMIN D) 1000 UNITS tablet Take 2,000 Units by mouth daily.     [provider]  EPINEPHrine (EPIPEN 2-PAK) 0.3 mg/0.3 mL IJ SOAJ injection Use as directed for a threatening allergic reaction 07/25/15   Gean Quint, MD  fexofenadine (ALLEGRA) 180 MG tablet Take 180 mg by mouth daily.    [provider]  fluticasone (FLONASE) 50 MCG/ACT nasal spray Use 2 sprays in each nostril once daily for stuffy nose or drainage. 01/26/16   Kennith Gain, MD  folic acid (FOLVITE) 1 MG tablet Take 1 mg by mouth daily.    [provider]  levothyroxine (SYNTHROID, LEVOTHROID) 137 MCG tablet Take 1 tablet by mouth daily. Reported on 03/11/2015 01/31/15   [provider]  mometasone-formoterol (DULERA) 200-5 MCG/ACT AERO Use 2 puffs twice daily to prevent cough or wheeze.  Rinse, gargle, and spit after use. 07/25/15   Gean Quint, MD  montelukast (SINGULAIR) 10 MG tablet Take one tablet each evening to prevent cough or wheeze. 03/30/16   Kennith Gain, MD  Multiple Vitamin (MULTIVITAMIN)  tablet Take 1 tablet by mouth daily. Reported on 02/11/2015    [provider]  Olopatadine HCl (PATANASE NA) Place into the nose. Reported on 03/11/2015    [provider]  Olopatadine HCl (PAZEO) 0.7 % SOLN Apply 1 drop to eye daily as needed. 01/26/16   Kennith Gain, MD  Olopatadine HCl 0.6 % SOLN Use 1-2 sprays in each nostril once daily in the evening for congestion. Patient not taking: Reported on 01/26/2016 07/25/15   Gean Quint, MD  predniSONE (DELTASONE) 20 MG tablet Take 2 tablets (40 mg total) by mouth daily. Take 2 tablets (40mg  total) by mouth for three days, then take 1 tablet (20mg  total) by mouth for two days. 06/03/16   Ingra Rother, Ozella Almond, PA-C  triamcinolone cream (KENALOG) 0.1 % Apply 1 application topically daily as needed. 03/06/15   Bobbitt, [place] Muta, MD    Family History Family History  Problem Relation Age of Onset  . Cancer Maternal Grandmother   . Heart disease Father   . Diabetes Father   . Cancer Mother   . Hypertension Other   . Allergic rhinitis Neg Hx   . Angioedema Neg Hx   . Asthma Neg Hx   . Eczema Neg Hx   . Immunodeficiency Neg Hx   . Urticaria Neg Hx     Social History Social History  Substance Use Topics  . Smoking status: Never Smoker  . Smokeless tobacco: Never Used  . Alcohol use No     Allergies   Apple; Other; Povidone-iodine; Shellfish allergy; Peach [prunus persica]; Zocor [simvastatin]; Betadine [povidone iodine]; Iohexol; and Latex   Review of Systems Review of Systems  Skin: Positive for rash.  All other systems reviewed and are negative.    Physical Exam Updated Vital Signs BP (!) 147/91 (BP Location: Right Arm)   Pulse 98   Temp 98.1 F (36.7 C) (Oral)   Resp 18   SpO2 100%   Physical Exam  Constitutional: She is oriented to person, place, and time. She appears well-developed and well-nourished. No distress.  HENT:  Head: Normocephalic and atraumatic.  Airway patent. No oral  lesions. No oral/facial swelling; no angioedema.  Cardiovascular: Normal rate, regular rhythm and normal heart sounds.   No murmur heard. Pulmonary/Chest: Effort normal and breath sounds normal. No respiratory distress.  Lungs clear to auscultation bilaterally. Patient speaking in full sentences with no hoarseness without difficulty.  Abdominal: Soft. She exhibits no distension. There is no tenderness.  Musculoskeletal: She exhibits no edema.  Neurological: She is alert and oriented to person, place, and time.  Skin: Skin is warm and dry.  Fine pruritic rash to temporal left face and right cheek.   Nursing note and vitals reviewed.  ED Treatments / Results  Labs (all labs ordered are listed, but only abnormal results are displayed) Labs Reviewed - No data to display  EKG  EKG Interpretation None       Radiology No results found.  Procedures Procedures (including critical care time)  Medications Ordered in ED Medications  predniSONE (DELTASONE) tablet 40 mg (40 mg Oral Given 06/04/16 0002)     Initial Impression / Assessment and Plan / ED Course  I have reviewed the triage vital signs and the nursing notes.  Pertinent labs & imaging results that were available during my care of the patient were reviewed by me and considered in my medical decision making (see chart for details).     Veronica Reilly is a 51 y.o. female who presents to ED for return of pruritic rash x2 days. No known triggers or exposures. No evidence of oral swelling or airway compromise. Lungs are clear bilaterally and vitals stable. No respiratory, GI or neurologic symptoms to suggest anaphylaxis. Given dose of prednisone in ED. Evaluation does not show pathology that would require ongoing emergent intervention or inpatient treatment. Rx for prednisone burst given. Home care instructions and return precautions discussed. Patient strongly encouraged to follow up with PCP and discuss rash as well as BP.  All questions answered.   Final Clinical Impressions(s) / ED Diagnoses   Final diagnoses:  Allergic reaction, initial encounter    New Prescriptions New Prescriptions   PREDNISONE (DELTASONE) 20 MG TABLET    Take 2 tablets (40 mg total) by mouth daily. Take 2 tablets (40mg  total) by mouth for three days, then take 1 tablet (20mg  total) by mouth for two days.     Vy Badley, Ozella Almond, PA-C 06/04/16 0004    Virgel Manifold, MD 06/12/16 [phone]

## 2016-06-04 MED FILL — predniSONE 20 MG TABS: 20 | 5 days supply | Qty: 8 | Fill #0

## 2016-06-04 NOTE — ED Notes (Signed)
Pt reports recurrent rash, itching, swelling and rash on her face.  Have been on dexamethasone dose pack and sxs stopped, but when she was finished taking it, sxs recurred.  Pt reports she's seen her PCP and states she had concern that maybe she is having an allergic reaction to her HTN med but states "my doctor did not want to listen to me.  She printed off something from the internet and gave it to me, telling me I can't take benadryl because it causes dementia."

## 2016-06-07 ENCOUNTER — Ambulatory Visit: Payer: BC Managed Care – PPO | Admitting: Allergy & Immunology

## 2016-06-25 MED FILL — SYMBICORT 160-4.5 MCG INH: 160-4.5 | 30 days supply | Qty: 10 | Fill #3

## 2016-06-25 MED FILL — LEVOTHYROXINE 137 MCG TAB: 137 | 90 days supply | Qty: 90 | Fill #0

## 2016-07-02 MED FILL — HYDROCHLOROTHIAZIDE 12.5 MG: 12.5 | 90 days supply | Qty: 90 | Fill #0

## 2016-07-02 MED FILL — NIFEDIPINE ER 60 MG TABLET: 60 | 90 days supply | Qty: 90 | Fill #0

## 2016-07-16 ENCOUNTER — Other Ambulatory Visit: Payer: Self-pay | Admitting: Allergy

## 2016-07-16 MED FILL — PAZEO 0.7% EYE DROPS: 0.7 | 25 days supply | Qty: 3 | Fill #1

## 2016-07-16 MED FILL — FOLIC ACID 1 MG TABLET: 1 | 30 days supply | Qty: 30 | Fill #4

## 2016-07-16 MED FILL — MONTELUKAST SOD 10 MG TAB: 10 | 30 days supply | Qty: 30 | Fill #0

## 2016-08-20 ENCOUNTER — Other Ambulatory Visit: Payer: Self-pay

## 2016-08-20 MED FILL — FOLIC ACID 1 MG TABLET: 1 | 30 days supply | Qty: 30 | Fill #5

## 2016-08-20 MED FILL — FLUTICASONE PROP 50 MCG SPR: 50 | 30 days supply | Qty: 16 | Fill #0

## 2016-08-20 MED FILL — SYMBICORT 160-4.5 MCG INH: 160-4.5 | 30 days supply | Qty: 10 | Fill #4

## 2016-08-24 ENCOUNTER — Other Ambulatory Visit: Payer: Self-pay | Admitting: Allergy

## 2016-08-24 NOTE — Telephone Encounter (Signed)
RF for montelukast denied, pt needs an OV. Was given a refill on 08-20-16

## 2016-09-01 ENCOUNTER — Ambulatory Visit (INDEPENDENT_AMBULATORY_CARE_PROVIDER_SITE_OTHER): Payer: BC Managed Care – PPO | Admitting: Allergy

## 2016-09-01 ENCOUNTER — Encounter: Payer: Self-pay | Admitting: Allergy

## 2016-09-01 VITALS — BP 122/80 | HR 72 | Resp 16

## 2016-09-01 DIAGNOSIS — J4540 Moderate persistent asthma, uncomplicated: Secondary | ICD-10-CM | POA: Diagnosis not present

## 2016-09-01 DIAGNOSIS — H1010 Acute atopic conjunctivitis, unspecified eye: Secondary | ICD-10-CM | POA: Diagnosis not present

## 2016-09-01 DIAGNOSIS — J309 Allergic rhinitis, unspecified: Secondary | ICD-10-CM

## 2016-09-01 DIAGNOSIS — L500 Allergic urticaria: Secondary | ICD-10-CM | POA: Diagnosis not present

## 2016-09-01 MED ORDER — MONTELUKAST SODIUM 10 MG PO TABS: ORAL_TABLET | ORAL | 5 refills | Status: DC

## 2016-09-01 MED ORDER — EPINEPHRINE 0.3 MG/0.3ML IJ SOAJ: 0.3000 mg | Freq: Once | INTRAMUSCULAR | 1 refills | Status: AC

## 2016-09-01 MED ORDER — BUDESONIDE-FORMOTEROL FUMARATE 160-4.5 MCG/ACT IN AERO: 2.0000 | INHALATION_SPRAY | Freq: Two times a day (BID) | RESPIRATORY_TRACT | 5 refills | Status: DC

## 2016-09-01 NOTE — Patient Instructions (Addendum)
Moderate persistent asthma - under good control - continue Symbicort 116mcg 2 puffs daily.   If not meeting the below goals increase to 2 puffs twice a day.    - have access to albuterol inhaler 2 puffs every 4-6 hours as needed for cough/wheeze/shortness of breath/chest tightness.  May use 15-20 minutes prior to activity.   Monitor frequency of use.   - Continue Singulair 10mg  daily Asthma control goals:   Full participation in all desired activities (may need albuterol before activity)  Albuterol use two time or less a week on average (not counting use with activity)  Cough interfering with sleep two time or less a month  Oral steroids no more than once a year  No hospitalizations   Allergic rhinoconjunctivitis - Saline nasal wash daily as needed. - Flonase 2 sprays each nostril as needed for congestion.  Use for 1-2 weeks at a time before stopping. - Olopatadine nasal spray 2 sprays twice a day for nasal drainage/post nasal drip.  - Continue Claritin 10mg  daily - Continue Pazeo 1 drop each eye as needed for itchy/watery/red eyes. - will obtain environmental panel today - Discussed allergen immunotherapy today.  She will check with her insurance regarding allergy shot coverage.   Will prescribe AuviQ to bring on days of your injections.  You can get your injections in [place].   Follow-up in 6 months or sooner if needed.

## 2016-09-01 NOTE — Addendum Note (Signed)
Addended by: Martyn Malay on: 09/01/2016 04:38 PM   Modules accepted: Orders

## 2016-09-01 NOTE — Progress Notes (Signed)
Follow-up Note  RE: Veronica Reilly MRN: [id number] DOB: 1965-08-20 Date of Office Visit: 09/01/2016   History of present illness: Veronica Reilly is a 51 y.o. female presenting today for follow-up of asthma and allergic rhinoconjunctivitis.  She was last seen in the office on 01/26/2016 by myself.  Since her last visit she has been doing relatively well without any major changes in her health, surgeries or hospitalizations. She does report earlier in the year she was started on valsartan for blood pressure control in about 2 weeks into taking the medication she started getting hives on her face that were very itchy. She was seen in the ED for this. She was prescribed prednisone to help with the hives. She was changed to hydrochlorothiazide and nifedipine and she has not had any further issues with hives. She denies having any systemic symptoms associated with this rash. She denies any preceding illnesses prior to the onset and no new foods, stings or changes in lotion soaps or detergents. In regards to her asthma she states she has been doing really well without any daytime or nighttime symptoms at this time. She does state that she is very inconsistent with her Symbicort and does not take it twice a day. She states she is rather good at taking it once a day in the morning. She does take her Singulair in the evening daily. She denies any ED or urgent care visits, hospitalizations or any steroid needs for her asthma. With her allergies she did make a switch from Zyrtec to Claritin and feels that the Claritin has been more effective however she continues to have nasal congestion and drainage and itchy eye symptoms. She has had feel that she states she forgets to use this. She also has Flonase which she states she is very inconsistent with use. She is interested in allergy shots at this time.    Review of systems: Review of Systems  Constitutional: Negative for chills, fever and  malaise/fatigue.  HENT: Positive for congestion. Negative for ear discharge, ear pain, nosebleeds, sinus pain, sore throat and tinnitus.   Eyes: Negative for discharge and redness.  Respiratory: Negative for cough, shortness of breath and wheezing.   Cardiovascular: Negative for chest pain.  Gastrointestinal: Negative for abdominal pain, constipation, diarrhea, heartburn, nausea and vomiting.  Musculoskeletal: Negative for joint pain.  Skin: Negative for itching and rash.  Neurological: Negative for headaches.    All other systems negative unless noted above in HPI  Past medical/social/surgical/family history have been reviewed and are unchanged unless specifically indicated below.  No changes  Medication List: Allergies as of 09/01/2016      Reactions   Apple Anaphylaxis   Other Shortness Of Breath   CHERRIES, TREES, MOLD,DUST, COCKROACHES   Povidone-iodine Anaphylaxis   Shellfish Allergy Anaphylaxis   Peach [prunus Persica] Itching, Hives   mouth   Zocor [simvastatin] Other (See Comments)   Alopecia   Betadine [povidone Iodine]    Due to shellfish allergy   Iohexol    Contrast dye   Latex Rash      Medication List       Accurate as of 09/01/16 12:29 PM. Always use your most recent med list.          albuterol 108 (90 Base) MCG/ACT inhaler Commonly known as:  VENTOLIN HFA Use 2 puffs every four hours as needed for cough or wheeze.  May use 2 puffs 10-20 minutes prior to exercise.   budesonide-formoterol 160-4.5 MCG/ACT inhaler  Commonly known as:  SYMBICORT Inhale 2 puffs into the lungs 2 (two) times daily.   cetirizine 10 MG tablet Commonly known as:  ZYRTEC Take 10 mg by mouth daily.   cholecalciferol 1000 units tablet Commonly known as:  VITAMIN D Take 2,000 Units by mouth daily.   EPINEPHrine 0.3 mg/0.3 mL Soaj injection Commonly known as:  EPIPEN 2-PAK Use as directed for a threatening allergic reaction   fexofenadine 180 MG tablet Commonly known  as:  ALLEGRA Take 180 mg by mouth daily.   fluticasone 50 MCG/ACT nasal spray Commonly known as:  FLONASE Use 2 sprays in each nostril once daily for stuffy nose or drainage.   folic acid 1 MG tablet Commonly known as:  FOLVITE Take 1 mg by mouth daily.   hydrochlorothiazide 12.5 MG capsule Commonly known as:  MICROZIDE Take 12.5 mg by mouth every morning.   levothyroxine 137 MCG tablet Commonly known as:  SYNTHROID, LEVOTHROID Take 1 tablet by mouth daily. Reported on 03/11/2015   loratadine 10 MG tablet Commonly known as:  CLARITIN Take 10 mg by mouth daily.   montelukast 10 MG tablet Commonly known as:  SINGULAIR TAKE ONE TABLET BY MOUTH EACH EVENING TO PREVENT COUGH OR WHEEZE.   multivitamin tablet Take 1 tablet by mouth daily. Reported on 02/11/2015   NIFEdipine 60 MG 24 hr tablet Commonly known as:  PROCARDIA XL/ADALAT-CC Take 60 mg by mouth daily.   Olopatadine HCl 0.7 % Soln Commonly known as:  PAZEO Apply 1 drop to eye daily as needed.   triamcinolone cream 0.1 % Commonly known as:  KENALOG Apply 1 application topically daily as needed.       Known medication allergies: Allergies  Allergen Reactions  . Apple Anaphylaxis  . Other Shortness Of Breath    CHERRIES, TREES, MOLD,DUST, COCKROACHES  . Povidone-Iodine Anaphylaxis  . Shellfish Allergy Anaphylaxis  . Peach [Prunus Persica] Itching and Hives    mouth  . Zocor [Simvastatin] Other (See Comments)    Alopecia  . Betadine [Povidone Iodine]     Due to shellfish allergy  . Iohexol     Contrast dye  . Latex Rash     Physical examination: Blood pressure 122/80, pulse 72, resp. rate 16.  General: Alert, interactive, in no acute distress. HEENT: PERRLA, TMs pearly gray, turbinates moderately edematous with clear discharge, post-pharynx non erythematous. Neck: Supple without lymphadenopathy. Lungs: Clear to auscultation without wheezing, rhonchi or rales. {no increased work of  breathing. CV: Normal S1, S2 without murmurs. Abdomen: Nondistended, nontender. Skin: Warm and dry, without lesions or rashes. Extremities:  No clubbing, cyanosis or edema. Neuro:   Grossly intact.  Diagnositics/Labs: Spirometry: FEV1: 1.93L  84%, FVC: 2.41L  86%, ratio consistent with Nonobstructive pattern  Allergy testing: Deferred due to recent antihistamine use  Assessment and plan:   Moderate persistent asthma - under good control - continue Symbicort 141mcg 2 puffs daily.   If not meeting the below goals increase to 2 puffs twice a day.    - have access to albuterol inhaler 2 puffs every 4-6 hours as needed for cough/wheeze/shortness of breath/chest tightness.  May use 15-20 minutes prior to activity.   Monitor frequency of use.   - Continue Singulair 10mg  daily Asthma control goals:   Full participation in all desired activities (may need albuterol before activity)  Albuterol use two time or less a week on average (not counting use with activity)  Cough interfering with sleep two time or less a month  Oral steroids no more than once a year  No hospitalizations   Allergic rhinoconjunctivitis - Saline nasal wash daily as needed. - Flonase 2 sprays each nostril as needed for congestion.  Use for 1-2 weeks at a time before stopping. - Olopatadine nasal spray 2 sprays twice a day for nasal drainage/post nasal drip.  - Continue Claritin 10mg  daily - Continue Pazeo 1 drop each eye as needed for itchy/watery/red eyes. - will obtain environmental panel today - Discussed allergen immunotherapy today including benefits and risks.  She will check with her insurance regarding allergy shot coverage.   Will prescribe AuviQ to bring on days of your injections.  You can get your injections in [place].   Urticaria  - May be a delayed drug reaction to the valsartan as she did have resolution of the hives after stopping this medication  - She will monitor for hive recurrence in  note any changes in medications, new foods, stings, change in body products etc. to determine if there are any triggering events.  - She may use additional dose of Claritin if hives return  Follow-up in 6 months or sooner if needed.     I appreciate the opportunity to take part in [facility] care. Please do not hesitate to contact me with questions.  Sincerely,   Prudy Feeler, MD Allergy/Immunology [facility]

## 2016-09-03 ENCOUNTER — Other Ambulatory Visit: Payer: Self-pay

## 2016-09-03 ENCOUNTER — Other Ambulatory Visit: Payer: Self-pay | Admitting: Allergy

## 2016-09-03 DIAGNOSIS — J4540 Moderate persistent asthma, uncomplicated: Secondary | ICD-10-CM

## 2016-09-03 LAB — IGE+ALLERGENS ZONE 2(30)
Alternaria Alternata IgE: <0.1 kU/L
Amer Sycamore IgE Qn: <0.1 kU/L
Bermuda Grass IgE: 0.31 kU/L — AB
Cedar, Mountain IgE: 0.13 kU/L — AB
Common Silver Birch IgE: 1.15 kU/L — AB
D Farinae IgE: 0.69 kU/L — AB
D001-IGE D PTERONYSSINUS: 0.44 kU/L — AB
Dog Dander IgE: <0.1 kU/L
E001-IGE CAT DANDER: 0.31 kU/L — AB
G010-IGE JOHNSON GRASS: 0.51 kU/L — AB
G017-IGE BAHIA GRASS: 0.59 kU/L — AB
Hickory, White IgE: 0.2 kU/L — AB
I206-IGE COCKROACH, AMERICAN: 0.54 kU/L — AB
IgE (Immunoglobulin E), Serum: 63 IU/mL (ref 0–100)
Mucor Racemosus IgE: <0.1 kU/L
Nettle IgE: <0.1 kU/L
Oak, White IgE: 1.1 kU/L — AB
Penicillium Chrysogen IgE: <0.1 kU/L
Plantain, English IgE: <0.1 kU/L
Ragweed, Short IgE: 0.76 kU/L — AB
Stemphylium Herbarum IgE: <0.1 kU/L
Sweet gum IgE RAST Ql: 1.11 kU/L — AB
Timothy Grass IgE: 0.88 kU/L — AB

## 2016-09-03 MED ORDER — MONTELUKAST SODIUM 10 MG PO TABS: ORAL_TABLET | ORAL | 5 refills | Status: DC

## 2016-09-03 MED FILL — MONTELUKAST SOD 10 MG TAB: 10 | 30 days supply | Qty: 30 | Fill #0

## 2016-09-10 ENCOUNTER — Telehealth: Payer: Self-pay

## 2016-09-10 NOTE — Telephone Encounter (Signed)
Patient called today. I have advised her of results and she is going to call us back and let us know for sure on the immunotherapy injections.

## 2016-09-17 MED FILL — LEVOTHYROXINE 137 MCG TAB: 137 | 90 days supply | Qty: 90 | Fill #1

## 2016-09-21 MED FILL — FOLIC ACID 1 MG TABLET: 1 | 30 days supply | Qty: 30 | Fill #6

## 2016-09-21 MED FILL — HYDROCHLOROTHIAZIDE 12.5 MG: 12.5 | 90 days supply | Qty: 90 | Fill #0

## 2016-09-21 MED FILL — NIFEDIPINE ER 60 MG TABLET: 60 | 90 days supply | Qty: 90 | Fill #0

## 2016-10-01 MED FILL — MONTELUKAST SOD 10 MG TAB: 10 | 30 days supply | Qty: 30 | Fill #1

## 2016-10-28 MED FILL — FOLIC ACID 1 MG TABLET: 1 | 30 days supply | Qty: 30 | Fill #7

## 2016-10-28 MED FILL — FLUTICASONE PROP 50 MCG SPR: 50 | 30 days supply | Qty: 16 | Fill #1

## 2016-11-05 MED FILL — MONTELUKAST SOD 10 MG TAB: 10 | 30 days supply | Qty: 30 | Fill #2

## 2016-11-30 MED FILL — FOLIC ACID 1 MG TABLET: 1 | 30 days supply | Qty: 30 | Fill #8

## 2016-12-08 MED FILL — MONTELUKAST SOD 10 MG TAB: 10 | 30 days supply | Qty: 30 | Fill #3

## 2016-12-16 MED FILL — LEVOTHYROXINE 137 MCG TAB: 137 | 90 days supply | Qty: 90 | Fill #2

## 2016-12-24 MED FILL — NIFEDIPINE ER 60 MG TABLET: 60 | 90 days supply | Qty: 90 | Fill #1

## 2016-12-29 MED FILL — HYDROCHLOROTHIAZIDE 12.5 MG: 12.5 | 90 days supply | Qty: 90 | Fill #1

## 2016-12-31 MED FILL — MONTELUKAST SOD 10 MG TAB: 10 | 30 days supply | Qty: 30 | Fill #4

## 2016-12-31 MED FILL — FOLIC ACID 1 MG TABLET: 1 | 30 days supply | Qty: 30 | Fill #9

## 2017-01-19 MED FILL — SYMBICORT 160-4.5 MCG INH: 160-4.5 | 30 days supply | Qty: 10 | Fill #5

## 2017-01-19 MED FILL — FLUTICASONE PROP 50 MCG SPR: 50 | 30 days supply | Qty: 16 | Fill #2

## 2017-01-28 MED FILL — MONTELUKAST SOD 10 MG TAB: 10 | 30 days supply | Qty: 30 | Fill #5

## 2017-01-28 MED FILL — FOLIC ACID 1 MG TABLET: 1 | 30 days supply | Qty: 30 | Fill #10

## 2017-02-25 ENCOUNTER — Other Ambulatory Visit: Payer: Self-pay | Admitting: Allergy

## 2017-02-25 DIAGNOSIS — J4540 Moderate persistent asthma, uncomplicated: Secondary | ICD-10-CM

## 2017-02-28 MED FILL — MONTELUKAST SOD 10 MG TAB: 10 | 30 days supply | Qty: 30 | Fill #0

## 2017-02-28 MED FILL — FOLIC ACID 1 MG TABLET: 1 | 30 days supply | Qty: 30 | Fill #0

## 2017-03-10 ENCOUNTER — Other Ambulatory Visit: Payer: Self-pay | Admitting: Obstetrics and Gynecology

## 2017-03-10 DIAGNOSIS — N6320 Unspecified lump in the left breast, unspecified quadrant: Secondary | ICD-10-CM

## 2017-03-16 ENCOUNTER — Other Ambulatory Visit: Payer: BC Managed Care – PPO

## 2017-03-16 ENCOUNTER — Other Ambulatory Visit: Payer: Self-pay

## 2017-03-16 ENCOUNTER — Other Ambulatory Visit: Payer: Self-pay | Admitting: Obstetrics and Gynecology

## 2017-03-16 DIAGNOSIS — N6320 Unspecified lump in the left breast, unspecified quadrant: Secondary | ICD-10-CM

## 2017-03-18 ENCOUNTER — Other Ambulatory Visit: Payer: Self-pay | Admitting: Obstetrics and Gynecology

## 2017-03-18 ENCOUNTER — Ambulatory Visit
Admission: RE | Admit: 2017-03-18 | Discharge: 2017-03-18 | Disposition: A | Discharge status: Home / Self Care | Payer: BC Managed Care – PPO | Source: Ambulatory Visit | Attending: Obstetrics and Gynecology | Admitting: Obstetrics and Gynecology

## 2017-03-18 DIAGNOSIS — N6320 Unspecified lump in the left breast, unspecified quadrant: Secondary | ICD-10-CM

## 2017-03-25 MED FILL — HYDROCHLOROTHIAZIDE 12.5 MG: 12.5 | 90 days supply | Qty: 90 | Fill #1

## 2017-03-25 MED FILL — NIFEDIPINE ER 60 MG TABLET: 60 | 90 days supply | Qty: 90 | Fill #1

## 2017-04-04 ENCOUNTER — Other Ambulatory Visit: Payer: Self-pay | Admitting: Allergy

## 2017-04-04 DIAGNOSIS — J309 Allergic rhinitis, unspecified: Secondary | ICD-10-CM

## 2017-04-04 DIAGNOSIS — H1010 Acute atopic conjunctivitis, unspecified eye: Secondary | ICD-10-CM

## 2017-04-04 DIAGNOSIS — J4540 Moderate persistent asthma, uncomplicated: Secondary | ICD-10-CM

## 2017-04-04 MED FILL — PAZEO 0.7% EYE DROPS: 0.7 | 50 days supply | Qty: 3 | Fill #0

## 2017-04-04 MED FILL — SYMBICORT 160-4.5 MCG INH: 160-4.5 | 30 days supply | Qty: 10 | Fill #0

## 2017-04-04 MED FILL — FLUTICASONE PROP 50 MCG SPR: 50 | 30 days supply | Qty: 16 | Fill #0

## 2017-04-15 ENCOUNTER — Other Ambulatory Visit: Payer: Self-pay | Admitting: Allergy

## 2017-04-15 DIAGNOSIS — J4540 Moderate persistent asthma, uncomplicated: Secondary | ICD-10-CM

## 2017-04-15 MED ORDER — MONTELUKAST SODIUM 10 MG PO TABS: ORAL_TABLET | ORAL | 0 refills | Status: DC

## 2017-04-15 MED FILL — MONTELUKAST SOD 10 MG TAB: 10 | 30 days supply | Qty: 30 | Fill #0

## 2017-04-15 NOTE — Telephone Encounter (Signed)
Patient is requesting a refill for Montelukast, [place]. She said pharmacy would not refill. I told her she needed to make an appt, which she did for 04-28-17.

## 2017-04-19 MED FILL — FOLIC ACID 1 MG TABLET: 1 | 90 days supply | Qty: 90 | Fill #0

## 2017-04-28 ENCOUNTER — Encounter: Payer: Self-pay | Admitting: Allergy

## 2017-04-28 ENCOUNTER — Ambulatory Visit: Payer: BC Managed Care – PPO | Admitting: Allergy

## 2017-04-28 VITALS — BP 122/68 | HR 88 | Resp 16

## 2017-04-28 DIAGNOSIS — H1010 Acute atopic conjunctivitis, unspecified eye: Secondary | ICD-10-CM | POA: Diagnosis not present

## 2017-04-28 DIAGNOSIS — J309 Allergic rhinitis, unspecified: Secondary | ICD-10-CM | POA: Diagnosis not present

## 2017-04-28 DIAGNOSIS — J4540 Moderate persistent asthma, uncomplicated: Secondary | ICD-10-CM

## 2017-04-28 NOTE — Patient Instructions (Addendum)
Moderate persistent asthma - continue Symbicort 169mcg 2 puffs twice a day.   - have access to albuterol inhaler 2 puffs every 4-6 hours as needed for cough/wheeze/shortness of breath/chest tightness.  May use 15-20 minutes prior to activity.   Monitor frequency of use.   - Continue Singulair 10mg  daily Asthma control goals:   Full participation in all desired activities (may need albuterol before activity)  Albuterol use two time or less a week on average (not counting use with activity)  Cough interfering with sleep two time or less a month  Oral steroids no more than once a year  No hospitalizations   Allergic rhinoconjunctivitis - Saline nasal rinse daily as needed. - Flonase 2 sprays each nostril as needed for congestion.  Use for 1-2 weeks at a time before stopping. - Olopatadine nasal spray 2 sprays twice a day for nasal drainage/post nasal drip.  - Continue Claritin or Zyrtec 10mg  daily - Continue Pazeo 1 drop each eye as needed for itchy/watery/red eyes. - continue avoidance measures for dust mites, cat, grasses, trees, and weeds. - allergen immunotherapy has been discussed.  You can check with her insurance regarding allergy shot coverage.   Will prescribe AuviQ to bring on days of your injections.  You can get your injections in [place].  Let us know at any time if you are interested in starting this therapy for allergy symptom control  Follow-up in 6 months or sooner if needed.

## 2017-04-28 NOTE — Progress Notes (Signed)
Follow-up Note  RE: Veronica Reilly MRN: [id number] DOB: 1965/08/15 Date of Office Visit: 04/28/2017   History of present illness: Veronica Reilly is a 52 y.o. female presenting today for follow-up of asthma and allergic rhinoconjunctivitis.  She was last seen in the office on 09/01/16 by myself. Since this visit she denies any major health changes, surgeries or hospitalizations.  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  She does report she has had more nighttime cough but states she may miss her evening dose of singulair and her symbicort (states more compliant with taking singulair).  She also thinks she may have a reflux component as she states she does eat at night before bed.  She states she has needed to use her albuterol about once a month on average.  She denies any ED/UC visits, oral steroids or hospitalizations.     With her allergies she does feel that her medication regimen is helpful in reducing symptoms. She is using nasal olopatadine and flonase daily, zyrtec daily and pazeo as needed.  She is still interested in allergen immunotherapy.      She denies any issues with hives since last visit.    Review of systems: Review of Systems  Constitutional: Negative for fever, malaise/fatigue and weight loss.  HENT: Positive for congestion. Negative for ear discharge, ear pain, nosebleeds, sinus pain and sore throat.   Eyes: Negative for pain, discharge and redness.  Respiratory: Negative for cough and shortness of breath.   Cardiovascular: Negative for chest pain.  Gastrointestinal: Negative for abdominal pain, constipation, diarrhea, heartburn, nausea and vomiting.  Musculoskeletal: Negative for joint pain.  Skin: Negative for itching and rash.  Neurological: Negative for headaches.    All other systems negative unless noted above in HPI  Past medical/social/surgical/family history  have been reviewed and are unchanged unless specifically indicated below.  No changes  Medication List: Allergies as of 04/28/2017      Reactions   Apple Anaphylaxis   Other Shortness Of Breath   CHERRIES, TREES, MOLD,DUST, COCKROACHES   Povidone-iodine Anaphylaxis   Shellfish Allergy Anaphylaxis   Peach [prunus Persica] Itching, Hives   mouth   Zocor [simvastatin] Other (See Comments)   Alopecia   Betadine [povidone Iodine]    Due to shellfish allergy   Iohexol    Contrast dye   Latex Rash      Medication List        Accurate as of 04/28/17  7:10 PM. Always use your most recent med list.          albuterol 108 (90 Base) MCG/ACT inhaler Commonly known as:  VENTOLIN HFA Use 2 puffs every four hours as needed for cough or wheeze.  May use 2 puffs 10-20 minutes prior to exercise.   budesonide-formoterol 160-4.5 MCG/ACT inhaler Commonly known as:  SYMBICORT Inhale 2 puffs into the lungs 2 (two) times daily.   cetirizine 10 MG tablet Commonly known as:  ZYRTEC Take 10 mg by mouth daily.   cholecalciferol 1000 units tablet Commonly known as:  VITAMIN D Take 2,000 Units by mouth daily.   EPINEPHrine 0.3 mg/0.3 mL Soaj injection Commonly known as:  EPIPEN 2-PAK Use as directed for a threatening allergic reaction   fexofenadine 180 MG tablet Commonly known as:  ALLEGRA Take 180 mg by mouth daily.   fluticasone 50 MCG/ACT nasal spray Commonly known as:  FLONASE USE 2 SPRAYS IN  EACH NOSTRIL ONCE DAILY FOR STUFFY NOSE OR DRAINAGE.   folic acid 1 MG tablet Commonly known as:  FOLVITE Take 1 mg by mouth daily.   hydrochlorothiazide 12.5 MG capsule Commonly known as:  MICROZIDE Take 12.5 mg by mouth every morning.   levothyroxine 112 MCG tablet Commonly known as:  SYNTHROID, LEVOTHROID   loratadine 10 MG tablet Commonly known as:  CLARITIN Take 10 mg by mouth daily.   montelukast 10 MG tablet Commonly known as:  SINGULAIR TAKE 1 TABLET BY MOUTH EACH EVENING  TO PREVENT COUGH OR WHEEZE.   NIFEdipine 60 MG 24 hr tablet Commonly known as:  PROCARDIA XL/ADALAT-CC Take 60 mg by mouth daily.   PAZEO 0.7 % Soln Generic drug:  Olopatadine HCl APPLY 1 DROP TO EYE DAILY AS NEEDED.   triamcinolone cream 0.1 % Commonly known as:  KENALOG Apply 1 application topically daily as needed.       Known medication allergies: Allergies  Allergen Reactions  . Apple Anaphylaxis  . Other Shortness Of Breath    CHERRIES, TREES, MOLD,DUST, COCKROACHES  . Povidone-Iodine Anaphylaxis  . Shellfish Allergy Anaphylaxis  . Peach [Prunus Persica] Itching and Hives    mouth  . Zocor [Simvastatin] Other (See Comments)    Alopecia  . Betadine [Povidone Iodine]     Due to shellfish allergy  . Iohexol     Contrast dye  . Latex Rash     Physical examination: Blood pressure 122/68, pulse 88, resp. rate 16.  General: Alert, interactive, in no acute distress. HEENT: PERRLA, TMs pearly gray, turbinates mildly edematous with clear discharge, post-pharynx non erythematous. Neck: Supple without lymphadenopathy. Lungs: Clear to auscultation without wheezing, rhonchi or rales. {no increased work of breathing. CV: Normal S1, S2 without murmurs. Abdomen: Nondistended, nontender. Skin: Warm and dry, without lesions or rashes. Extremities:  No clubbing, cyanosis or edema. Neuro:   Grossly intact.  Diagnositics/Labs:  Spirometry: FEV1: 2.15L  88%, FVC: 2.55L  84%, ratio consistent with nonobstructive pattern  Assessment and plan:   Moderate persistent asthma - continue Symbicort 113mcg 2 puffs twice a day.   - have access to albuterol inhaler 2 puffs every 4-6 hours as needed for cough/wheeze/shortness of breath/chest tightness.  May use 15-20 minutes prior to activity.   Monitor frequency of use.   - Continue Singulair 10mg  daily Asthma control goals:   Full participation in all desired activities (may need albuterol before activity)  Albuterol use two time  or less a week on average (not counting use with activity)  Cough interfering with sleep two time or less a month  Oral steroids no more than once a year  No hospitalizations   Allergic rhinoconjunctivitis - Saline nasal rinse daily as needed. - Flonase 2 sprays each nostril as needed for congestion.  Use for 1-2 weeks at a time before stopping. - Olopatadine nasal spray 2 sprays twice a day for nasal drainage/post nasal drip.  - Continue Claritin or Zyrtec 10mg  daily - Continue Pazeo 1 drop each eye as needed for itchy/watery/red eyes. - continue avoidance measures for dust mites, cat, grasses, trees, and weeds. - allergen immunotherapy has been discussed.  You can check with her insurance regarding allergy shot coverage.   Will prescribe AuviQ to bring on days of your injections.  You can get your injections in [place].  Let us know at any time if you are interested in starting this therapy for allergy symptom control  Follow-up in 6 months or sooner if needed.  I appreciate the opportunity to take part in [facility] care. Please do not hesitate to contact me with questions.  Sincerely,   Prudy Feeler, MD Allergy/Immunology [facility]

## 2017-06-20 ENCOUNTER — Other Ambulatory Visit: Payer: Self-pay | Admitting: Allergy

## 2017-06-20 DIAGNOSIS — H1010 Acute atopic conjunctivitis, unspecified eye: Secondary | ICD-10-CM

## 2017-06-20 DIAGNOSIS — J4540 Moderate persistent asthma, uncomplicated: Secondary | ICD-10-CM

## 2017-06-20 DIAGNOSIS — J309 Allergic rhinitis, unspecified: Secondary | ICD-10-CM

## 2017-06-20 MED FILL — FLUTICASONE PROP 50 MCG SPR: 50 | 30 days supply | Qty: 16 | Fill #0

## 2017-06-20 MED FILL — SYMBICORT 160-4.5 MCG INH: 160-4.5 | 30 days supply | Qty: 10 | Fill #0

## 2017-06-22 MED FILL — NIFEDIPINE ER 60 MG TABLET: 60 | 90 days supply | Qty: 90 | Fill #0

## 2017-06-23 MED FILL — HYDROCHLOROTHIAZIDE 12.5 MG: 12.5 | 90 days supply | Qty: 90 | Fill #0

## 2017-07-25 MED FILL — FOLIC ACID 1 MG TABS: 1 | 90 days supply | Qty: 90 | Fill #1

## 2017-08-26 MED FILL — SYMBICORT 160-4.5 MCG INH: 160-4.5 | 30 days supply | Qty: 10 | Fill #1

## 2017-09-23 MED FILL — HYDROCHLOROTHIAZIDE 12.5 MG: 12.5 | 90 days supply | Qty: 90 | Fill #1

## 2017-09-23 MED FILL — NIFEDIPINE ER 60 MG TABLET: 60 | 90 days supply | Qty: 90 | Fill #1

## 2017-09-25 ENCOUNTER — Other Ambulatory Visit: Payer: Self-pay | Admitting: Allergy

## 2017-09-25 DIAGNOSIS — J4540 Moderate persistent asthma, uncomplicated: Secondary | ICD-10-CM

## 2017-11-01 MED FILL — FOLIC ACID 1 MG TABS: 1 | 90 days supply | Qty: 90 | Fill #2

## 2017-11-02 ENCOUNTER — Other Ambulatory Visit: Payer: Self-pay | Admitting: Allergy

## 2017-11-02 DIAGNOSIS — J4540 Moderate persistent asthma, uncomplicated: Secondary | ICD-10-CM

## 2017-11-04 ENCOUNTER — Other Ambulatory Visit: Payer: Self-pay | Admitting: *Deleted

## 2017-11-04 ENCOUNTER — Telehealth: Payer: Self-pay | Admitting: Allergy

## 2017-11-04 DIAGNOSIS — J4540 Moderate persistent asthma, uncomplicated: Secondary | ICD-10-CM

## 2017-11-04 MED ORDER — MONTELUKAST SODIUM 10 MG PO TABS: ORAL_TABLET | ORAL | 0 refills | Status: DC

## 2017-11-04 NOTE — Telephone Encounter (Signed)
Called and spoke with patient, informed her we could send in a courtesy refill however she would need to make an OV for further refills. Patient was at a pep rally at a high school and would call back next week to make an appointment.

## 2017-11-04 NOTE — Telephone Encounter (Signed)
Pt called and needs to have singulair called into [facility] on [address]. [phone]

## 2017-11-09 ENCOUNTER — Other Ambulatory Visit: Payer: Self-pay | Admitting: Obstetrics and Gynecology

## 2017-11-09 DIAGNOSIS — N6320 Unspecified lump in the left breast, unspecified quadrant: Secondary | ICD-10-CM

## 2017-11-18 ENCOUNTER — Other Ambulatory Visit: Payer: BC Managed Care – PPO

## 2017-11-29 MED FILL — SYMBICORT 160-4.5 MCG INH: 160-4.5 | 30 days supply | Qty: 10 | Fill #2

## 2017-12-02 ENCOUNTER — Other Ambulatory Visit: Payer: Self-pay | Admitting: Allergy

## 2017-12-02 DIAGNOSIS — J4540 Moderate persistent asthma, uncomplicated: Secondary | ICD-10-CM

## 2017-12-02 NOTE — Telephone Encounter (Signed)
Pt has apt scheduled 11/21 gave a refill of montelukast to last pt until apt.

## 2017-12-08 ENCOUNTER — Encounter: Payer: Self-pay | Admitting: Allergy

## 2017-12-08 ENCOUNTER — Ambulatory Visit: Payer: BC Managed Care – PPO | Admitting: Allergy

## 2017-12-08 VITALS — BP 112/84 | HR 84 | Resp 20 | Ht 66.0 in | Wt 229.4 lb

## 2017-12-08 DIAGNOSIS — H1013 Acute atopic conjunctivitis, bilateral: Secondary | ICD-10-CM | POA: Diagnosis not present

## 2017-12-08 DIAGNOSIS — J4540 Moderate persistent asthma, uncomplicated: Secondary | ICD-10-CM

## 2017-12-08 DIAGNOSIS — J3089 Other allergic rhinitis: Secondary | ICD-10-CM | POA: Diagnosis not present

## 2017-12-08 MED ORDER — OLOPATADINE HCL 0.7 % OP SOLN: 1.0000 [drp] | Freq: Every day | OPHTHALMIC | 5 refills | Status: DC

## 2017-12-08 MED ORDER — FLUTICASONE PROPIONATE 50 MCG/ACT NA SUSP: NASAL | 5 refills | Status: DC

## 2017-12-08 MED ORDER — TRIAMCINOLONE ACETONIDE 0.1 % EX CREA: 1.0000 "application " | TOPICAL_CREAM | Freq: Every day | CUTANEOUS | 2 refills | Status: DC | PRN

## 2017-12-08 MED ORDER — ALBUTEROL SULFATE HFA 108 (90 BASE) MCG/ACT IN AERS: INHALATION_SPRAY | RESPIRATORY_TRACT | 1 refills | Status: DC

## 2017-12-08 MED ORDER — MONTELUKAST SODIUM 10 MG PO TABS: 10.0000 mg | ORAL_TABLET | Freq: Every day | ORAL | 5 refills | Status: DC

## 2017-12-08 MED ORDER — BUDESONIDE-FORMOTEROL FUMARATE 160-4.5 MCG/ACT IN AERO: INHALATION_SPRAY | RESPIRATORY_TRACT | 5 refills | Status: DC

## 2017-12-08 MED ORDER — EPINEPHRINE 0.3 MG/0.3ML IJ SOAJ: INTRAMUSCULAR | 1 refills | Status: DC

## 2017-12-08 NOTE — Patient Instructions (Addendum)
Moderate persistent asthma - continue Symbicort 180mcg 2 puffs twice a day.   - have access to albuterol inhaler 2 puffs every 4-6 hours as needed for cough/wheeze/shortness of breath/chest tightness.  May use 15-20 minutes prior to activity.   Monitor frequency of use.   - Continue Singulair 10mg  daily Asthma control goals:   Full participation in all desired activities (may need albuterol before activity)  Albuterol use two time or less a week on average (not counting use with activity)  Cough interfering with sleep two time or less a month  Oral steroids no more than once a year  No hospitalizations   Allergic rhinoconjunctivitis - Saline nasal rinse daily as needed. - Flonase 2 sprays each nostril as needed for congestion.  Use for 1-2 weeks at a time before stopping. - Olopatadine nasal spray 2 sprays twice a day for nasal drainage/post nasal drip.  - Change Claritin to either Xyzal 5mg , Allegra 180mg  or Zyrtec 10mg  daily - Continue Pazeo 1 drop each eye as needed for itchy/watery/red eyes. - continue avoidance measures for dust mites, cat, grasses, trees, and weeds. - allergen immunotherapy has been discussed previous.  You can check with her insurance regarding allergy shot coverage if interested to start if medication management is not effective.  Let us know at any time if you are interested in starting this therapy for allergy symptom control.  Allergy shots can be provided in [place].    Follow-up in 6 months or sooner if needed.

## 2017-12-08 NOTE — Progress Notes (Signed)
Follow-up Note  RE: Veronica Reilly MRN: [id number] DOB: 1965-09-30 Date of Office Visit: 12/08/2017   History of present illness: Veronica Reilly is a 52 y.o. female presenting today for follow-up of asthma, allergic rhinoconjunctivitis. She was last seen in the office on 04/28/17 by myself for follow-up.  She has been doing well since last visit without any major health changes, surgeries or hospitalizations.   She states over the past couple weeks since she has gone back to using claritin she has had more nasal congestion and drainage.  She states she normally will rotate between claritin, zyrtec and allegra.  She has flonase that she uses as needed for nasal congestion.  She ran out of olopatadine nasal spray.  She has pazeo but states she normally only needs to use this during spring season.   With her asthma she states she has used albuterol couple times when she has had to be outside in the cool weather for football games.  She does use symbicort 136mcg 2 puffs twice a day as well as singulair daily.  She has not had any ED/UC visits or oral steroid needs.  Denies nightime awakenings.    Review of systems: Review of Systems  Constitutional: Negative for chills, fever and malaise/fatigue.  HENT: Positive for congestion. Negative for ear discharge, ear pain, nosebleeds, sinus pain and sore throat.   Eyes: Negative for pain, discharge and redness.  Respiratory: Positive for cough and shortness of breath. Negative for sputum production.   Cardiovascular: Negative for chest pain.  Gastrointestinal: Negative for abdominal pain, constipation, diarrhea, heartburn, nausea and vomiting.  Musculoskeletal: Negative for joint pain.  Skin: Negative for itching and rash.  Neurological: Negative for headaches.    All other systems negative unless noted above in HPI  Past medical/social/surgical/family history have been reviewed and are unchanged unless specifically indicated below.  No  changes  Medication List: Allergies as of 12/08/2017      Reactions   Apple Anaphylaxis   Other Shortness Of Breath   CHERRIES, TREES, MOLD,DUST, COCKROACHES   Povidone-iodine Anaphylaxis   Shellfish Allergy Anaphylaxis   Peach [prunus Persica] Itching, Hives   mouth   Zocor [simvastatin] Other (See Comments)   Alopecia   Betadine [povidone Iodine]    Due to shellfish allergy   Iohexol    Contrast dye   Latex Rash      Medication List        Accurate as of 12/08/17  7:07 PM. Always use your most recent med list.          albuterol 108 (90 Base) MCG/ACT inhaler Commonly known as:  PROVENTIL HFA;VENTOLIN HFA Use 2 puffs every four hours as needed for cough or wheeze.  May use 2 puffs 10-20 minutes prior to exercise.   cetirizine 10 MG tablet Commonly known as:  ZYRTEC Take 10 mg by mouth daily.   cholecalciferol 1000 units tablet Commonly known as:  VITAMIN D Take 2,000 Units by mouth daily.   EPINEPHrine 0.3 mg/0.3 mL Soaj injection Commonly known as:  EPI-PEN Use as directed for a threatening allergic reaction   fexofenadine 180 MG tablet Commonly known as:  ALLEGRA Take 180 mg by mouth daily.   fluticasone 50 MCG/ACT nasal spray Commonly known as:  FLONASE USE 2 SPRAYS IN EACH NOSTRIL ONCE DAILY FOR STUFFY NOSE OR DRAINAGE.   folic acid 1 MG tablet Commonly known as:  FOLVITE Take 1 mg by mouth daily.   hydrochlorothiazide 12.5 MG  capsule Commonly known as:  MICROZIDE Take 12.5 mg by mouth every morning.   levothyroxine 112 MCG tablet Commonly known as:  SYNTHROID, LEVOTHROID   loratadine 10 MG tablet Commonly known as:  CLARITIN Take 10 mg by mouth daily.   montelukast 10 MG tablet Commonly known as:  SINGULAIR TAKE 1 TABLET BY MOUTH EVERY EVENING TO PREVENT COUGH OR WHEEZING   NIFEdipine 60 MG 24 hr tablet Commonly known as:  PROCARDIA XL/NIFEDICAL XL Take 60 mg by mouth daily.   PAZEO 0.7 % Soln Generic drug:  Olopatadine HCl APPLY 1  DROP TO EYE DAILY AS NEEDED.   SYMBICORT 160-4.5 MCG/ACT inhaler Generic drug:  budesonide-formoterol INHALE 2 PUFFS INTO THE LUNGS 2 (TWO) TIMES DAILY.   triamcinolone cream 0.1 % Commonly known as:  KENALOG Apply 1 application topically daily as needed.       Known medication allergies: Allergies  Allergen Reactions  . Apple Anaphylaxis  . Other Shortness Of Breath    CHERRIES, TREES, MOLD,DUST, COCKROACHES  . Povidone-Iodine Anaphylaxis  . Shellfish Allergy Anaphylaxis  . Peach [Prunus Persica] Itching and Hives    mouth  . Zocor [Simvastatin] Other (See Comments)    Alopecia  . Betadine [Povidone Iodine]     Due to shellfish allergy  . Iohexol     Contrast dye  . Latex Rash     Physical examination: Blood pressure 112/84, pulse 84, resp. rate 20, height 5\' 6"  (1.676 m), weight 229 lb 6.4 oz (104.1 kg).  General: Alert, interactive, in no acute distress. HEENT: PERRLA, TMs pearly gray, turbinates moderately edematous with clear discharge, post-pharynx non erythematous. Neck: Supple without lymphadenopathy. Lungs: Clear to auscultation without wheezing, rhonchi or rales. {no increased work of breathing. CV: Normal S1, S2 without murmurs. Abdomen: Nondistended, nontender. Skin: Warm and dry, without lesions or rashes. Extremities:  No clubbing, cyanosis or edema. Neuro:   Grossly intact.  Diagnositics/Labs: Spirometry: FEV1: 2.0L 79%, FVC: 2.43L 77%. Function is slightly reduced from last study  Assessment and plan:   Moderate persistent asthma - continue Symbicort 164mcg 2 puffs twice a day.   - have access to albuterol inhaler 2 puffs every 4-6 hours as needed for cough/wheeze/shortness of breath/chest tightness.  May use 15-20 minutes prior to activity.   Monitor frequency of use.   - Continue Singulair 10mg  daily Asthma control goals:   Full participation in all desired activities (may need albuterol before activity)  Albuterol use two time or less a  week on average (not counting use with activity)  Cough interfering with sleep two time or less a month  Oral steroids no more than once a year  No hospitalizations   Allergic rhinoconjunctivitis - Saline nasal rinse daily as needed. - Flonase 2 sprays each nostril as needed for congestion.  Use for 1-2 weeks at a time before stopping. - Olopatadine nasal spray 2 sprays twice a day for nasal drainage/post nasal drip.  - Change Claritin to either Xyzal 5mg , Allegra 180mg  or Zyrtec 10mg  daily - Continue Pazeo 1 drop each eye as needed for itchy/watery/red eyes. - continue avoidance measures for dust mites, cat, grasses, trees, and weeds. - allergen immunotherapy has been discussed previous.  You can check with her insurance regarding allergy shot coverage if interested to start if medication management is not effective.  Let us know at any time if you are interested in starting this therapy for allergy symptom control.  Allergy shots can be provided in [place].    Follow-up  in 6 months or sooner if needed.     I appreciate the opportunity to take part in [facility] care. Please do not hesitate to contact me with questions.  Sincerely,   Prudy Feeler, MD Allergy/Immunology [facility] [place]

## 2017-12-21 MED FILL — HYDROCHLOROTHIAZIDE 12.5 MG: 12.5 | 90 days supply | Qty: 90 | Fill #2

## 2017-12-21 MED FILL — NIFEDIPINE ER 60 MG TABLET: 60 | 90 days supply | Qty: 90 | Fill #2

## 2018-02-28 MED FILL — FOLIC ACID 1 MG TABS: 1 | 90 days supply | Qty: 90 | Fill #3

## 2018-03-20 MED FILL — HYDROCHLOROTHIAZIDE 12.5 MG: 12.5 | 30 days supply | Qty: 30 | Fill #0

## 2018-03-20 MED FILL — NIFEDIPINE ER OSMOTIC RELEA: 60 | 30 days supply | Qty: 30 | Fill #0

## 2018-03-23 MED FILL — SYMBICORT 160-4.5 MCG INH: 160-4.5 | 30 days supply | Qty: 10 | Fill #3

## 2018-03-29 ENCOUNTER — Other Ambulatory Visit: Payer: Self-pay

## 2018-03-29 ENCOUNTER — Ambulatory Visit: Payer: BC Managed Care – PPO | Admitting: Allergy

## 2018-03-29 ENCOUNTER — Encounter: Payer: Self-pay | Admitting: Allergy

## 2018-03-29 VITALS — BP 120/82 | HR 92 | Temp 98.6°F | Resp 20

## 2018-03-29 DIAGNOSIS — J4541 Moderate persistent asthma with (acute) exacerbation: Secondary | ICD-10-CM

## 2018-03-29 DIAGNOSIS — H1010 Acute atopic conjunctivitis, unspecified eye: Secondary | ICD-10-CM

## 2018-03-29 DIAGNOSIS — J3089 Other allergic rhinitis: Secondary | ICD-10-CM | POA: Diagnosis not present

## 2018-03-29 DIAGNOSIS — J302 Other seasonal allergic rhinitis: Secondary | ICD-10-CM

## 2018-03-29 NOTE — Progress Notes (Signed)
Follow Up Note  RE: Veronica Reilly MRN: [id number] DOB: [birth year] Date of Office Visit: 03/29/2018  Referring provider: Leeroy Cha,* Primary care provider: Leeroy Cha, MD  Chief Complaint: Cough (chills,body aches and fever started last Wed but is now feeling better) and Shortness of Breath  History of Present Illness: I had the pleasure of seeing Veronica Reilly for a follow up visit at the [facility] on 03/30/2018. She is a 53 y.o. female, who is being followed for asthma and allergic rhino conjunctivitis. Today she is here for new complaint of coughing. Her previous allergy office visit was on 12/08/2017 with Dr. Nelva Bush.   Asthma/URI Last Wednesday patient developed URI symptoms with body aches, chills, rhinorrhea, coughing. She had 1 fever of 100 and took tylenol and has been afebrile since then.  She woke up this morning and still had some coughing especially with going up the stairs but overall feeling improved. No sick contacts but her husband has developed similar symptom as herself and went to urgent care today. No recent travel. She is a high school principal.  Currently on Symbicort 160 2 puffs twice a day and using albuterol every 4 hours with some benefit. No recent oral steroids. Taking Singulair daily.  Allergic rhinoconjunctivitis Using zyrtec daily and patanase prn and Flonase daily with good benefit.   Assessment and Plan: [name] is a 53 y.o. female with: Moderate persistent asthma with (acute) exacerbation Increased coughing and shortness of breath since URI symptoms last Wednesday.  One episode of mild fever.  No recent travel.  Today's spirometry was normal but she just used albuterol 2 hours prior.  She most likely has URI induced asthma exacerbation. . Start prednisone taper. . Daily controller medication(s): continue Symbicort 160 2 puffs twice a day and rinse mouth afterwards. . Prior to physical activity: May  use albuterol rescue inhaler 2 puffs 5 to 15 minutes prior to strenuous physical activities. Marland Kitchen Rescue medications: May use albuterol rescue inhaler 2 puffs or nebulizer every 4 to 6 hours as needed for shortness of breath, chest tightness, coughing, and wheezing. Monitor frequency of use.   Seasonal and perennial allergic rhinoconjunctivitis Past history - 2018 Immunocap was positive to dust mites, cat, grass, cockroach, tree pollen. Interim history - symptoms controlled with below regimen. - Continue environmental control measures. - Saline nasal rinse daily as needed. - Flonase 2 sprays each nostril as needed for congestion.  - Olopatadine nasal spray 2 sprays twice a day as needed for nasal drainage/post nasal drip.  - May use over the counter antihistamines such as Zyrtec (cetirizine), Claritin (loratadine), Allegra (fexofenadine), or Xyzal (levocetirizine) daily as needed. - Continue Pazeo 1 drop each eye as needed for itchy/watery/red eyes.  Return in about 6 months (around 09/29/2018).  Diagnostics: Spirometry:  Tracings reviewed. Her effort: Good reproducible efforts. FVC: 2.33L FEV1: 1.87L, 74% predicted FEV1/FVC ratio: 80% Interpretation: Spirometry consistent with normal pattern.  Please see scanned spirometry results for details.  Medication List:  Current Outpatient Medications  Medication Sig Dispense Refill  . albuterol (VENTOLIN HFA) 108 (90 Base) MCG/ACT inhaler Use 2 puffs every four hours as needed for cough or wheeze.  May use 2 puffs 10-20 minutes prior to exercise. 1 Inhaler 1  . budesonide-formoterol (SYMBICORT) 160-4.5 MCG/ACT inhaler INHALE 2 PUFFS INTO THE LUNGS 2 (TWO) TIMES DAILY. 10.2 g 5  . cetirizine (ZYRTEC) 10 MG tablet Take 10 mg by mouth daily.    . cholecalciferol (VITAMIN D) 1000  UNITS tablet Take 2,000 Units by mouth daily.     Marland Kitchen EPINEPHrine (EPIPEN 2-PAK) 0.3 mg/0.3 mL IJ SOAJ injection Use as directed for a threatening allergic reaction 4 Device 1   . fexofenadine (ALLEGRA) 180 MG tablet Take 180 mg by mouth daily.    . fluticasone (FLONASE) 50 MCG/ACT nasal spray USE 2 SPRAYS IN EACH NOSTRIL ONCE DAILY FOR STUFFY NOSE OR DRAINAGE 16 g 5  . folic acid (FOLVITE) 1 MG tablet Take 1 mg by mouth daily.    . hydrochlorothiazide (MICROZIDE) 12.5 MG capsule Take 12.5 mg by mouth every morning.  1  . levothyroxine (SYNTHROID, LEVOTHROID) 112 MCG tablet     . loratadine (CLARITIN) 10 MG tablet Take 10 mg by mouth daily.    . montelukast (SINGULAIR) 10 MG tablet Take 1 tablet (10 mg total) by mouth at bedtime. 30 tablet 5  . NIFEdipine (PROCARDIA XL/ADALAT-CC) 60 MG 24 hr tablet Take 60 mg by mouth daily.  1  . Olopatadine HCl (PAZEO) 0.7 % SOLN Apply 1 drop to eye daily. 2.5 mL 5  . triamcinolone cream (KENALOG) 0.1 % Apply 1 application topically daily as needed. 80 g 2   No current facility-administered medications for this visit.    Allergies: Allergies  Allergen Reactions  . Apple Anaphylaxis  . Other Shortness Of Breath    CHERRIES, TREES, MOLD,DUST, COCKROACHES  . Povidone-Iodine Anaphylaxis  . Shellfish Allergy Anaphylaxis  . Peach [Prunus Persica] Itching and Hives    mouth  . Zocor [Simvastatin] Other (See Comments)    Alopecia  . Betadine [Povidone Iodine]     Due to shellfish allergy  . Iohexol     Contrast dye  . Latex Rash   I reviewed her past medical history, social history, family history, and environmental history and no significant changes have been reported from previous visit on 12/08/2017.  Review of Systems  Constitutional: Negative for appetite change, chills, fever and unexpected weight change.  HENT: Positive for rhinorrhea. Negative for congestion.   Eyes: Negative for itching.  Respiratory: Positive for cough, chest tightness, shortness of breath and wheezing.   Gastrointestinal: Negative for abdominal pain.  Skin: Negative for rash.  Allergic/Immunologic: Positive for environmental allergies.   Neurological: Negative for headaches.   Objective: BP 120/82 (BP Location: Left Arm, Patient Position: Sitting, Cuff Size: Normal)   Pulse 92   Temp 98.6 F (37 C) (Oral)   Resp 20   SpO2 96%  There is no height or weight on file to calculate BMI. Physical Exam  Constitutional: She is oriented to person, place, and time. She appears well-developed and well-nourished.  HENT:  Head: Normocephalic and atraumatic.  Right Ear: External ear normal.  Left Ear: External ear normal.  Nose: Nose normal.  Mouth/Throat: Oropharynx is clear and moist.  Eyes: Conjunctivae and EOM are normal.  Neck: Neck supple.  Cardiovascular: Normal rate, regular rhythm and normal heart sounds. Exam reveals no gallop and no friction rub.  No murmur heard. Pulmonary/Chest: Effort normal and breath sounds normal. She has no wheezes. She has no rales.  Lymphadenopathy:    She has no cervical adenopathy.  Neurological: She is alert and oriented to person, place, and time.  Skin: Skin is warm. No rash noted.  Psychiatric: She has a normal mood and affect. Her behavior is normal.  Nursing note and vitals reviewed.  Previous notes and tests were reviewed. The plan was reviewed with the patient/family, and all questions/concerned were addressed.  It  was my pleasure to see Kaisey today and participate in her care. Please feel free to contact me with any questions or concerns.  Sincerely,  Rexene Alberts, DO Allergy & Immunology  [facility] office: [phone] [facility] office: [phone]

## 2018-03-29 NOTE — Patient Instructions (Addendum)
Moderate persistent asthma . Start prednisone taper. . Daily controller medication(s): continue Symbicort 160 2 puffs twice a day and rinse mouth afterwards. . Prior to physical activity: May use albuterol rescue inhaler 2 puffs 5 to 15 minutes prior to strenuous physical activities. Marland Kitchen Rescue medications: May use albuterol rescue inhaler 2 puffs or nebulizer every 4 to 6 hours as needed for shortness of breath, chest tightness, coughing, and wheezing. Monitor frequency of use.  . Asthma control goals:  o Full participation in all desired activities (may need albuterol before activity) o Albuterol use two times or less a week on average (not counting use with activity) o Cough interfering with sleep two times or less a month o Oral steroids no more than once a year o No hospitalizations  Recommend that you get the flu vaccine every fall.   Allergic rhinoconjunctivitis - Saline nasal rinse daily as needed. - Flonase 2 sprays each nostril as needed for congestion.  - Olopatadine nasal spray 2 sprays twice a day as needed for nasal drainage/post nasal drip.  - May use over the counter antihistamines such as Zyrtec (cetirizine), Claritin (loratadine), Allegra (fexofenadine), or Xyzal (levocetirizine) daily as needed. - Continue Pazeo 1 drop each eye as needed for itchy/watery/red eyes. - Continue avoidance measures for dust mites, cat, grasses, trees, and weeds.  Follow up in 6 months  Reducing Pollen Exposure . Pollen seasons: trees (spring), grass (summer) and ragweed/weeds (fall). Marland Kitchen Keep windows closed in your home and car to lower pollen exposure.  Susa Simmonds air conditioning in the bedroom and throughout the house if possible.  . Avoid going out in dry windy days - especially early morning. . Pollen counts are highest between 5 - 10 AM and on dry, hot and windy days.  . Save outside activities for late afternoon or after a heavy rain, when pollen levels are lower.  . Avoid mowing of  grass if you have grass pollen allergy. Marland Kitchen Be aware that pollen can also be transported indoors on people and pets.  . Dry your clothes in an automatic dryer rather than hanging them outside where they might collect pollen.  . Rinse hair and eyes before bedtime.

## 2018-03-30 DIAGNOSIS — H1010 Acute atopic conjunctivitis, unspecified eye: Secondary | ICD-10-CM | POA: Insufficient documentation

## 2018-03-30 DIAGNOSIS — J302 Other seasonal allergic rhinitis: Secondary | ICD-10-CM

## 2018-03-30 DIAGNOSIS — J4541 Moderate persistent asthma with (acute) exacerbation: Secondary | ICD-10-CM | POA: Insufficient documentation

## 2018-03-30 DIAGNOSIS — J3089 Other allergic rhinitis: Secondary | ICD-10-CM

## 2018-03-30 NOTE — Assessment & Plan Note (Signed)
Past history - 2018 Immunocap was positive to dust mites, cat, grass, cockroach, tree pollen. Interim history - symptoms controlled with below regimen. - Continue environmental control measures. - Saline nasal rinse daily as needed. - Flonase 2 sprays each nostril as needed for congestion.  - Olopatadine nasal spray 2 sprays twice a day as needed for nasal drainage/post nasal drip.  - May use over the counter antihistamines such as Zyrtec (cetirizine), Claritin (loratadine), Allegra (fexofenadine), or Xyzal (levocetirizine) daily as needed. - Continue Pazeo 1 drop each eye as needed for itchy/watery/red eyes.

## 2018-03-30 NOTE — Assessment & Plan Note (Signed)
Increased coughing and shortness of breath since URI symptoms last Wednesday.  One episode of mild fever.  No recent travel.  Today's spirometry was normal but she just used albuterol 2 hours prior.  She most likely has URI induced asthma exacerbation. . Start prednisone taper. . Daily controller medication(s): continue Symbicort 160 2 puffs twice a day and rinse mouth afterwards. . Prior to physical activity: May use albuterol rescue inhaler 2 puffs 5 to 15 minutes prior to strenuous physical activities. Marland Kitchen Rescue medications: May use albuterol rescue inhaler 2 puffs or nebulizer every 4 to 6 hours as needed for shortness of breath, chest tightness, coughing, and wheezing. Monitor frequency of use.

## 2018-06-07 ENCOUNTER — Other Ambulatory Visit: Payer: Self-pay | Admitting: Obstetrics and Gynecology

## 2018-06-07 DIAGNOSIS — N6019 Diffuse cystic mastopathy of unspecified breast: Secondary | ICD-10-CM

## 2018-06-19 HISTORY — PX: BREAST BIOPSY: SHX20

## 2018-06-27 ENCOUNTER — Other Ambulatory Visit: Payer: Self-pay

## 2018-06-27 ENCOUNTER — Ambulatory Visit
Admission: RE | Admit: 2018-06-27 | Discharge: 2018-06-27 | Disposition: A | Discharge status: Home / Self Care | Payer: BC Managed Care – PPO | Source: Ambulatory Visit | Attending: Obstetrics and Gynecology | Admitting: Obstetrics and Gynecology

## 2018-06-27 ENCOUNTER — Other Ambulatory Visit: Payer: Self-pay | Admitting: Obstetrics and Gynecology

## 2018-06-27 ENCOUNTER — Ambulatory Visit: Payer: BC Managed Care – PPO

## 2018-06-27 DIAGNOSIS — N6019 Diffuse cystic mastopathy of unspecified breast: Secondary | ICD-10-CM

## 2018-06-29 ENCOUNTER — Ambulatory Visit
Admission: RE | Admit: 2018-06-29 | Discharge: 2018-06-29 | Disposition: A | Discharge status: Home / Self Care | Payer: BC Managed Care – PPO | Source: Ambulatory Visit | Attending: Obstetrics and Gynecology | Admitting: Obstetrics and Gynecology

## 2018-06-29 ENCOUNTER — Other Ambulatory Visit: Payer: Self-pay

## 2018-06-29 DIAGNOSIS — N6019 Diffuse cystic mastopathy of unspecified breast: Secondary | ICD-10-CM

## 2018-06-29 DIAGNOSIS — C50919 Malignant neoplasm of unspecified site of unspecified female breast: Secondary | ICD-10-CM

## 2018-06-29 HISTORY — DX: Malignant neoplasm of unspecified site of unspecified female breast: C50.919

## 2018-07-03 ENCOUNTER — Ambulatory Visit: Payer: Self-pay | Admitting: Surgery

## 2018-07-03 DIAGNOSIS — C50912 Malignant neoplasm of unspecified site of left female breast: Secondary | ICD-10-CM

## 2018-07-03 NOTE — H&P (Signed)
[name] Documented: 07/03/2018 11:35 AM Location: [facility] Patient #: [id number] DOB: 1965-11-03 Married / Language: English / Race: Black or African American Female  History of Present Illness Marcello Moores A. Lakeyia Surber MD; 07/03/2018 12:39 PM) Patient words: Patient sent at the request of Dr[facility] Teressa Senter [facility] due to mammographic abnormality of left breast. This was picked up on screening mammogram. The patient denies any history of breast pain, nipple discharge or breast mass. She does relate a history of her mother with inflammatory breast cancer and a grandmother who had a mastectomy.  The area in the left breast is in the upper outer quadrant measures 3.1 cm and was an area of microcalcifications. 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  EXAM: DIAGNOSTIC LEFT MAMMOGRAM POST STEREOTACTIC BIOPSY  COMPARISON: Previous exam(s).  FINDINGS: Mammographic images were obtained following stereotactic guided biopsy of left breast calcifications. The coil shaped biopsy clip has migrated. On the exaggerated craniocaudal view the clip is migrated medially by 6.9 cm, and on the mL view is migrated inferiorly by 2.7 cm. The biopsy cavity and post biopsy air extends across the calcifications, may be now removed, indicating correct sampling.  IMPRESSION: Coil shaped biopsy clip is migrated from the upper-outer quadrant calcifications as detailed above.  Final Assessment: Post Procedure Mammograms for Marker Placement   Electronically Signed By: Lajean Manes M.D. On: 06/29/2018 10:32   The patient has a history of ultrasound-guided core needle biopsy of left subareolar breast mass demonstrating fibrocystic changes. Following the biopsy, she was recommended to have a six-month follow-up with a mammogram  and ultrasound.  EXAM: DIGITAL DIAGNOSTIC BILATERAL MAMMOGRAM WITH CAD AND TOMO  ULTRASOUND LEFT BREAST  COMPARISON: Previous exam(s).  ACR Breast Density Category c: The breast tissue is heterogeneously dense, which may obscure small masses.  FINDINGS: Mammographically, there are no suspicious masses, areas of architectural distortion or microcalcifications in the right breast.  There is a persistent approximately 1 cm circumscribed mass in the immediately subareolar left breast, with a post biopsy marker adjacent to it. Additionally, there is a new group of pleomorphic microcalcifications in the left breast upper outer quadrant, far posterior depth, measuring 3.1 x 1.2 x 1.7 cm.  Mammographic images were processed with CAD.  On physical exam, no suspicious masses are palpated.  Targeted ultrasound is performed, showing no suspicious masses or shadowing lesions. There is a left subareolar breast cyst which measures 1.0 x 0.6 x 1.0 cm. Examination of the left axilla demonstrates no evidence of lymphadenopathy.  IMPRESSION: Left breast upper outer quadrant 3.1 cm group of indeterminate calcifications, for which stereotactic core needle biopsy is recommended.  Persistent benign-appearing cyst in the subareolar left breast, post benign core needle biopsy demonstrating fibrocystic changes. No further imaging follow-up is recommended.  RECOMMENDATION: Stereotactic core needle biopsy of the left breast. Given the linear extent of the group of calcifications of over 3 cm, a second stereotactic core needle biopsy will be necessary to determine the extent of disease, if pathology results demonstrate malignancy.  I have discussed the findings and recommendations with the patient. Results were also provided in writing at the conclusion of the visit. If applicable, a reminder letter will be sent to the patient regarding the next appointment.  BI-RADS CATEGORY 4:  Suspicious.   Electronically Signed By:  Fidela Salisbury M.D. On: 06/27/2018 16:00        Diagnosis Breast, left, needle core biopsy, upper, outer quadrant - INVASIVE DUCTAL CARCINOMA - DUCTAL CARCINOMA IN SITU - SEE COMMENT.  The patient is a 53 year old female.   Past Surgical History Emeline Gins, CMA; 07/03/2018 11:35 AM) Breast Biopsy Left. multiple Foot Surgery Left. Oral Surgery  Diagnostic Studies History Emeline Gins, [place]; 07/03/2018 11:35 AM) Colonoscopy never Mammogram within last year Pap Smear 1-5 years ago  Allergies Emeline Gins, [place]; 07/03/2018 11:36 AM) Zocor *ANTIHYPERLIPIDEMICS* Allergies Reconciled  Medication History Emeline Gins, CMA; 07/03/2018 11:38 AM) hydroCHLOROthiazide (12.5MG  Capsule, Oral) Active. Folic Acid (1MG  Tablet, Oral) Active. Flonase (50MCG/DOSE Inhaler, Nasal) Active. Levothyroxine Sodium (112MCG Tablet, Oral) Active. Symbicort (160-4.5MCG/ACT Aerosol, Inhalation) Active. Singulair (10MG  Tablet, Oral) Active. Vitamin D (1000UNIT Tablet, Oral) Active. NIFEdipine ER Osmotic Release (60MG  Tablet ER 24HR, Oral) Active. Medications Reconciled  Social History Emeline Gins, [place]; 07/03/2018 11:35 AM) Caffeine use Carbonated beverages, Coffee. No alcohol use No drug use Tobacco use Never smoker.  Family History Emeline Gins, [place]; 07/03/2018 11:35 AM) Alcohol Abuse Brother. Breast Cancer Family Members In General, Mother. Cancer Family Members In General. Diabetes Mellitus Brother, Family Members In General, Father, Mother. Heart Disease Brother, Family Members In General, Father, Mother. Hypertension Brother, Family Members In [place], Father, Mother. Kidney Disease Father. Prostate Cancer Father. Respiratory Condition Mother. Thyroid problems Mother.  Pregnancy / Birth History Emeline Gins, [place]; 07/03/2018 11:35 AM) Age at menarche 46 years. Age of menopause  16-55 Gravida 0 Irregular periods Para 0  Other Problems Emeline Gins, CMA; 07/03/2018 11:35 AM) Asthma Breast Cancer Gastroesophageal Reflux Disease Heart murmur High blood pressure Thyroid Disease     Review of Systems Emeline Gins CMA; 07/03/2018 11:35 AM) General Not Present- Appetite Loss, Chills, Fatigue, Fever, Night Sweats, Weight Gain and Weight Loss. Skin Not Present- Change in Wart/Mole, Dryness, Hives, Jaundice, New Lesions, Non-Healing Wounds, Rash and Ulcer. HEENT Not Present- Earache, Hearing Loss, Hoarseness, Nose Bleed, Oral Ulcers, Ringing in the Ears, Seasonal Allergies, Sinus Pain, Sore Throat, Visual Disturbances, Wears glasses/contact lenses and Yellow Eyes. Breast Present- Breast Mass. Not Present- Breast Pain, Nipple Discharge and Skin Changes. Cardiovascular Not Present- Chest Pain, Difficulty Breathing Lying Down, Leg Cramps, Palpitations, Rapid Heart Rate, Shortness of Breath and Swelling of Extremities. Gastrointestinal Present- Indigestion. Not Present- Abdominal Pain, Bloating, Bloody Stool, Change in Bowel Habits, Chronic diarrhea, Constipation, Difficulty Swallowing, Excessive gas, Gets full quickly at meals, Hemorrhoids, Nausea, Rectal Pain and Vomiting. Female Genitourinary Not Present- Frequency, Nocturia, Painful Urination, Pelvic Pain and Urgency. Musculoskeletal Present- Back Pain. Not Present- Joint Pain, Joint Stiffness, Muscle Pain, Muscle Weakness and Swelling of Extremities. Neurological Not Present- Decreased Memory, Fainting, Headaches, Numbness, Seizures, Tingling, Tremor, Trouble walking and Weakness. Psychiatric Not Present- Anxiety, Bipolar, Change in Sleep Pattern, Depression, Fearful and Frequent crying. Endocrine Present- Cold Intolerance and Hot flashes. Not Present- Excessive Hunger, Hair Changes, Heat Intolerance and New Diabetes. Hematology Not Present- Blood Thinners, Easy Bruising, Excessive bleeding, Gland problems,  HIV and Persistent Infections.  Vitals Emeline Gins CMA; 07/03/2018 11:36 AM) 07/03/2018 11:36 AM Weight: 216.2 lb Height: 66in Body Surface Area: 2.07 m Body Mass Index: 34.9 kg/m  Temp.: 97.38F  Pulse: 115 (Regular)  BP: 128/88 (Sitting, Left Arm, Standard)        Physical Exam (Abrar Bilton A. Freddi Forster MD; 07/03/2018 12:39 PM)  General Mental Status-Alert. General Appearance-Consistent with stated age. Hydration-Well hydrated. Voice-Normal.  Head and Neck Head-normocephalic, atraumatic with no lesions  or palpable masses. Trachea-midline. Thyroid Gland Characteristics - normal size and consistency.  Eye Eyeball - Bilateral-Extraocular movements intact. Sclera/Conjunctiva - Bilateral-No scleral icterus.  Chest and Lung Exam Chest and lung exam reveals -quiet, even and easy respiratory effort with no use of accessory muscles and on auscultation, normal breath sounds, no adventitious sounds and normal vocal resonance. Inspection Chest Wall - Normal. Back - normal.  Breast Breast - Left-Symmetric, Non Tender, No Biopsy scars, no Dimpling - Left, No Inflammation, No Lumpectomy scars, No Mastectomy scars, No Peau d' Orange. Breast - Right-Symmetric, Non Tender, No Biopsy scars, no Dimpling - Right, No Inflammation, No Lumpectomy scars, No Mastectomy scars, No Peau d' Orange. Breast Lump-No Palpable Breast Mass.  Cardiovascular Cardiovascular examination reveals -normal heart sounds, regular rate and rhythm with no murmurs and normal pedal pulses bilaterally.  Neurologic Neurologic evaluation reveals -alert and oriented x 3 with no impairment of recent or remote memory. Mental Status-Normal.  Lymphatic Head & Neck  General Head & Neck Lymphatics: Bilateral - Description - Normal. Axillary  General Axillary Region: Bilateral - Description - Normal. Tenderness - Non Tender.    Assessment & Plan (Caffie Sotto A. Otoniel Myhand MD; 07/03/2018  12:40 PM)  BREAST CANCER, LEFT (C50.912) Impression: Await receptor status Refer to medical oncology, radiation oncology, and genetics. The patient would be a good breast conserving candidate not talk with her at great length today. Discussed sentinel lymph node mapping as well and the need for mastectomy and reconstruction of statin option she desires to pursue.  Current Plans Pt Education - [facility] Breast Cancer Information Given - [facility] "Breast Journey" Package Pt Education - [facility] Mastectomy HCI Pt Education - ABC (After Breast Cancer) Class Info: discussed with patient and provided information. Pt Education - [facility] Breast Biopsy HCI: discussed with patient and provided information. Pt Education - [facility] breast cancer surgery: discussed with patient and provided information. We discussed the staging and pathophysiology of breast cancer. We discussed all of the different options for treatment for breast cancer including surgery, chemotherapy, radiation therapy, Herceptin, and antiestrogen therapy. We discussed a sentinel lymph node biopsy as she does not appear to having lymph node involvement right now. We discussed the performance of that with injection of radioactive tracer and blue dye. We discussed that she would have an incision underneath her axillary hairline. We discussed that there is a bout a 10-20% chance of having a positive node with a sentinel lymph node biopsy and we will await the permanent pathology to make any other first further decisions in terms of her treatment. One of these options might be to return to the operating room to perform an axillary lymph node dissection. We discussed about a 1-2% risk lifetime of chronic shoulder pain as well as lymphedema associated with a sentinel lymph node biopsy. We discussed the options for treatment of the breast cancer which included lumpectomy versus a mastectomy. We discussed the performance of the lumpectomy with a wire placement. We  discussed a 10-20% chance of a positive margin requiring reexcision in the operating room. We also discussed that she may need radiation therapy or antiestrogen therapy or both if she undergoes lumpectomy. We discussed the mastectomy and the postoperative care for that as well. We discussed that there is no difference in her survival whether she undergoes lumpectomy with radiation therapy or antiestrogen therapy versus a mastectomy. There is a slight difference in the local recurrence rate being 3-5% with lumpectomy and about 1% with a mastectomy. We discussed the risks  of operation including bleeding, infection, possible reoperation. She understands her further therapy will be based on what her stages at the time of her operation.  Referred to Oncology, for evaluation and follow up (Oncology). Routine. Referred to Radiation Oncology, for evaluation and follow up (Radiation Oncology). Routine. Referred to Genetic Counseling, for evaluation and follow up [employer]). Routine.

## 2018-07-05 ENCOUNTER — Encounter: Payer: Self-pay | Admitting: Adult Health

## 2018-07-05 ENCOUNTER — Telehealth: Payer: Self-pay | Admitting: Radiation Oncology

## 2018-07-05 DIAGNOSIS — C50412 Malignant neoplasm of upper-outer quadrant of left female breast: Secondary | ICD-10-CM | POA: Insufficient documentation

## 2018-07-05 DIAGNOSIS — Z170 Estrogen receptor positive status [ER+]: Secondary | ICD-10-CM | POA: Insufficient documentation

## 2018-07-06 ENCOUNTER — Other Ambulatory Visit: Payer: Self-pay

## 2018-07-06 ENCOUNTER — Ambulatory Visit
Admission: RE | Admit: 2018-07-06 | Discharge: 2018-07-06 | Disposition: A | Discharge status: Home / Self Care | Payer: BC Managed Care – PPO | Source: Ambulatory Visit | Attending: Radiation Oncology | Admitting: Radiation Oncology

## 2018-07-06 ENCOUNTER — Encounter: Payer: Self-pay | Admitting: Radiation Oncology

## 2018-07-06 ENCOUNTER — Telehealth: Payer: Self-pay | Admitting: Oncology

## 2018-07-06 DIAGNOSIS — Z170 Estrogen receptor positive status [ER+]: Secondary | ICD-10-CM

## 2018-07-06 DIAGNOSIS — C50412 Malignant neoplasm of upper-outer quadrant of left female breast: Secondary | ICD-10-CM

## 2018-07-06 DIAGNOSIS — Z809 Family history of malignant neoplasm, unspecified: Secondary | ICD-10-CM

## 2018-07-06 NOTE — Telephone Encounter (Signed)
Received genetic counseling and medonc referrals from Dr. Brantley Stage at [place]. Veronica Reilly has been cld and scheduled for genetics via webex on 6/23 at 10am and medonc to see Dr. Jana Hakim on 6/25 at 4pm. Pt has agreed to both appointments. She's been made aware to arrive 15 minutes early on 6/25.

## 2018-07-06 NOTE — Progress Notes (Signed)
Radiation Oncology         [phone] ________________________________  Name: Veronica Reilly        MRN: [id number]  Date of Service: 07/06/2018 DOB: 29-Jan-1965  NU:[id number], Ronie Spies, MD  Erroll Luna, MD     REFERRING PHYSICIAN: Erroll Luna, MD   DIAGNOSIS: The encounter diagnosis was Malignant neoplasm of upper-outer quadrant of left breast in female, estrogen receptor positive ([place]).   HISTORY OF PRESENT ILLNESS: Veronica Reilly is a 53 y.o. female seen in the multidisciplinary breast clinic for a new diagnosis of left breast cancer.  The patient has a history of a cystic mass possibly papilloma that was detected on a screening mammogram at an outside facility in 2018.  There was a 1 cm circumscribed mass in the right breast which had not changed significantly in retrospect to 2014.  Fibrocystic changes were noted along the left breast in 2019 also at the outside facility.  She did undergo a ultrasound-guided left breast biopsy on 03/18/2017 in the retroareolar region of the left breast, and biopsy was consistent with fibrocystic change.  She has been followed closely since and had a bilateral diagnostic mammogram on 06/27/2018.  This revealed a 1 cm circumscribed mass in the immediate subareolar left breast with postbiopsy marker adjacent to it, and a new group of pleomorphic calcifications in low upper outer quadrant of the left breast measuring 3.1 x 1.2 x 1.7 cm.  No palpable masses were noted.  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  She is in the process of meeting with oncology to discuss treatment recommendations for her cancer and comes today to discuss the role of radiotherapy.  PREVIOUS  RADIATION THERAPY: No   PAST MEDICAL HISTORY:  Past Medical History:  Diagnosis Date   Anemia    ASCUS (atypical squamous cells of undetermined significance) on Pap smear    Asthma    Dysplasia of cervix, low grade (CIN 1)    Eczema    History of chicken pox    Hypertension    Hypothyroid    Uterine fibroid        PAST SURGICAL HISTORY: Past Surgical History:  Procedure Laterality Date   ADENOIDECTOMY     LEEP     MYOMECTOMY     TONSILECTOMY, ADENOIDECTOMY, BILATERAL MYRINGOTOMY AND TUBES     TONSILLECTOMY       FAMILY HISTORY:  Family History  Problem Relation Age of Onset   Cancer Maternal Grandmother    Heart disease Father    Diabetes Father    Cancer Mother    Hypertension Other    Allergic rhinitis Neg Hx    Angioedema Neg Hx    Asthma Neg Hx    Eczema Neg Hx    Immunodeficiency Neg Hx    Urticaria Neg Hx      SOCIAL HISTORY:  reports that she has never smoked. She has never used smokeless tobacco. She reports that she does not drink alcohol or use drugs. The patient is married and lives in [place]. 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 She is accompanied by her husband. They do not have any children.    ALLERGIES: Apple, Other, Povidone-iodine, Shellfish allergy, Peach [prunus  persica], Zocor [simvastatin], Betadine [povidone iodine], Iohexol, and Latex   MEDICATIONS:  Current Outpatient Medications  Medication Sig Dispense Refill   albuterol (VENTOLIN HFA) 108 (90 Base) MCG/ACT inhaler Use 2 puffs every four hours as needed for cough or wheeze.  May use 2 puffs 10-20 minutes prior to exercise. 1 Inhaler 1   budesonide-formoterol (SYMBICORT) 160-4.5 MCG/ACT inhaler INHALE 2 PUFFS INTO THE LUNGS 2 (TWO) TIMES DAILY. 10.2 g 5   cetirizine (ZYRTEC) 10 MG tablet Take 10 mg by mouth daily.     cholecalciferol (VITAMIN D) 1000 UNITS tablet Take 2,000 Units by mouth daily.      EPINEPHrine (EPIPEN 2-PAK) 0.3  mg/0.3 mL IJ SOAJ injection Use as directed for a threatening allergic reaction 4 Device 1   fexofenadine (ALLEGRA) 180 MG tablet Take 180 mg by mouth daily.     fluticasone (FLONASE) 50 MCG/ACT nasal spray USE 2 SPRAYS IN EACH NOSTRIL ONCE DAILY FOR STUFFY NOSE OR DRAINAGE 16 g 5   folic acid (FOLVITE) 1 MG tablet Take 1 mg by mouth daily.     hydrochlorothiazide (MICROZIDE) 12.5 MG capsule Take 12.5 mg by mouth every morning.  1   levothyroxine (SYNTHROID, LEVOTHROID) 112 MCG tablet      loratadine (CLARITIN) 10 MG tablet Take 10 mg by mouth daily.     montelukast (SINGULAIR) 10 MG tablet Take 1 tablet (10 mg total) by mouth at bedtime. 30 tablet 5   NIFEdipine (PROCARDIA XL/ADALAT-CC) 60 MG 24 hr tablet Take 60 mg by mouth daily.  1   Olopatadine HCl (PAZEO) 0.7 % SOLN Apply 1 drop to eye daily. 2.5 mL 5   triamcinolone cream (KENALOG) 0.1 % Apply 1 application topically daily as needed. 80 g 2   No current facility-administered medications for this encounter.      REVIEW OF SYSTEMS: On review of systems, the patient reports that she is doing well overall. She denies any chest pain, shortness of breath, cough, fevers, chills, night sweats, unintended weight changes. She denies any bowel or bladder disturbances, and denies abdominal pain, nausea or vomiting. She denies any new musculoskeletal or joint aches or pains. A complete review of systems is obtained and is otherwise negative.     PHYSICAL EXAM:  Wt Readings from Last 3 Encounters:  12/08/17 229 lb 6.4 oz (104.1 kg)  01/26/16 232 lb 6.4 oz (105.4 kg)  07/23/15 230 lb 2.6 oz (104.4 kg)   Temp Readings from Last 3 Encounters:  03/29/18 98.6 F (37 C) (Oral)  06/03/16 98.1 F (36.7 C) (Oral)  01/26/16 97.6 F (36.4 C) (Oral)   BP Readings from Last 3 Encounters:  03/29/18 120/82  12/08/17 112/84  04/28/17 122/68   Pulse Readings from Last 3 Encounters:  03/29/18 92  12/08/17 84  04/28/17 88     In general  this is a well appearing African American female in no acute distress. she's alert and oriented x4 and appropriate throughout the examination. Cardiopulmonary assessment is negative for acute distress and she exhibits normal effort. Breast exam is deferred.   ECOG = 0  0 - Asymptomatic (Fully active, able to carry on all predisease activities without restriction)  1 - Symptomatic but completely ambulatory (Restricted in physically strenuous activity but ambulatory and able to carry out work of a light or sedentary nature. For example, light housework, office work)  2 - Symptomatic, <50% in bed during the day (Ambulatory and capable of all self care but unable to carry out  any work activities. Up and about more than 50% of waking hours)  3 - Symptomatic, >50% in bed, but not bedbound (Capable of only limited self-care, confined to bed or chair 50% or more of waking hours)  4 - Bedbound (Completely disabled. Cannot carry on any self-care. Totally confined to bed or chair)  5 - Death   Eustace Pen MM, Creech RH, Tormey DC, et al. [phone]). "Toxicity and response criteria of the [facility]". [place] Oncol. 5 (6): 649-55    LABORATORY DATA:  Lab Results  Component Value Date   WBC 15.9 (H) 03/22/2009   HGB 11.2 (L) 03/22/2009   HCT 34.3 (L) 03/22/2009   MCV 86.9 03/22/2009   PLT 203 03/22/2009   Lab Results  Component Value Date   NA 138 03/18/2009   K 3.4 (L) 03/18/2009   CL 105 03/18/2009   CO2 26 03/18/2009   Lab Results  Component Value Date   ALT 13 05/15/2007   AST 16 05/15/2007   ALKPHOS 48 05/15/2007   BILITOT 1.0 05/15/2007      RADIOGRAPHY: US Breast Ltd Uni Left Inc Axilla  Result Date: 06/27/2018 CLINICAL DATA:  The patient has a history of ultrasound-guided core needle biopsy of left subareolar breast mass demonstrating fibrocystic changes. Following the biopsy, she was recommended to have a six-month follow-up with a mammogram and  ultrasound. EXAM: DIGITAL DIAGNOSTIC BILATERAL MAMMOGRAM WITH CAD AND TOMO ULTRASOUND LEFT BREAST COMPARISON:  Previous exam(s). ACR Breast Density Category c: The breast tissue is heterogeneously dense, which may obscure small masses. FINDINGS: Mammographically, there are no suspicious masses, areas of architectural distortion or microcalcifications in the right breast. There is a persistent approximately 1 cm circumscribed mass in the immediately subareolar left breast, with a post biopsy marker adjacent to it. Additionally, there is a new group of pleomorphic microcalcifications in the left breast upper outer quadrant, far posterior depth, measuring 3.1 x 1.2 x 1.7 cm. Mammographic images were processed with CAD. On physical exam, no suspicious masses are palpated. Targeted ultrasound is performed, showing no suspicious masses or shadowing lesions. There is a left subareolar breast cyst which measures 1.0 x 0.6 x 1.0 cm. Examination of the left axilla demonstrates no evidence of lymphadenopathy. IMPRESSION: Left breast upper outer quadrant 3.1 cm group of indeterminate calcifications, for which stereotactic core needle biopsy is recommended. Persistent benign-appearing cyst in the subareolar left breast, post benign core needle biopsy demonstrating fibrocystic changes. No further imaging follow-up is recommended. RECOMMENDATION: Stereotactic core needle biopsy of the left breast. Given the linear extent of the group of calcifications of over 3 cm, a second stereotactic core needle biopsy will be necessary to determine the extent of disease, if pathology results demonstrate malignancy. I have discussed the findings and recommendations with the patient. Results were also provided in writing at the conclusion of the visit. If applicable, a reminder letter will be sent to the patient regarding the next appointment. BI-RADS CATEGORY  4: Suspicious. Electronically Signed   By: Fidela Salisbury M.D.   On: 06/27/2018  16:00   Mm Diag Breast Tomo Bilateral  Result Date: 06/27/2018 CLINICAL DATA:  The patient has a history of ultrasound-guided core needle biopsy of left subareolar breast mass demonstrating fibrocystic changes. Following the biopsy, she was recommended to have a six-month follow-up with a mammogram and ultrasound. EXAM: DIGITAL DIAGNOSTIC BILATERAL MAMMOGRAM WITH CAD AND TOMO ULTRASOUND LEFT BREAST COMPARISON:  Previous exam(s). ACR Breast Density Category c: The breast tissue  is heterogeneously dense, which may obscure small masses. FINDINGS: Mammographically, there are no suspicious masses, areas of architectural distortion or microcalcifications in the right breast. There is a persistent approximately 1 cm circumscribed mass in the immediately subareolar left breast, with a post biopsy marker adjacent to it. Additionally, there is a new group of pleomorphic microcalcifications in the left breast upper outer quadrant, far posterior depth, measuring 3.1 x 1.2 x 1.7 cm. Mammographic images were processed with CAD. On physical exam, no suspicious masses are palpated. Targeted ultrasound is performed, showing no suspicious masses or shadowing lesions. There is a left subareolar breast cyst which measures 1.0 x 0.6 x 1.0 cm. Examination of the left axilla demonstrates no evidence of lymphadenopathy. IMPRESSION: Left breast upper outer quadrant 3.1 cm group of indeterminate calcifications, for which stereotactic core needle biopsy is recommended. Persistent benign-appearing cyst in the subareolar left breast, post benign core needle biopsy demonstrating fibrocystic changes. No further imaging follow-up is recommended. RECOMMENDATION: Stereotactic core needle biopsy of the left breast. Given the linear extent of the group of calcifications of over 3 cm, a second stereotactic core needle biopsy will be necessary to determine the extent of disease, if pathology results demonstrate malignancy. I have discussed the  findings and recommendations with the patient. Results were also provided in writing at the conclusion of the visit. If applicable, a reminder letter will be sent to the patient regarding the next appointment. BI-RADS CATEGORY  4: Suspicious. Electronically Signed   By: Fidela Salisbury M.D.   On: 06/27/2018 16:00   Mm Clip Placement Left  Result Date: 06/29/2018 CLINICAL DATA:  Status post stereotactic core needle biopsy of left breast calcifications. EXAM: DIAGNOSTIC LEFT MAMMOGRAM POST STEREOTACTIC BIOPSY COMPARISON:  Previous exam(s). FINDINGS: Mammographic images were obtained following stereotactic guided biopsy of left breast calcifications. The coil shaped biopsy clip has migrated. On the exaggerated craniocaudal view the clip is migrated medially by 6.9 cm, and on the mL view is migrated inferiorly by 2.7 cm. The biopsy cavity and post biopsy air extends across the calcifications, may be now removed, indicating correct sampling. IMPRESSION: Coil shaped biopsy clip is migrated from the upper-outer quadrant calcifications as detailed above. Final Assessment: Post Procedure Mammograms for Marker Placement Electronically Signed   By: Lajean Manes M.D.   On: 06/29/2018 10:32   Mm Lt Breast Bx W Loc Dev 1st Lesion Image Bx Spec Stereo Guide  Addendum Date: 06/30/2018   ADDENDUM REPORT: 06/30/2018 15:52 ADDENDUM: Pathology revealed GRADE II INVASIVE DUCTAL CARCINOMA, DUCTAL CARCINOMA IN SITU of the LEFT breast, upper, outer quadrant. This was found to be concordant by Dr. Lajean Manes. Pathology results were discussed with the patient and her husband by telephone. The patient reported doing well after the biopsy with tenderness at the site. Post biopsy instructions and care were reviewed and questions were answered. The patient was encouraged to call [facility] for any additional concerns. Surgical consultation has been arranged with Dr. Erroll Luna at [facility] on July 03, 2018. Pathology results reported by Stacie Acres, RN on 06/30/2018. Electronically Signed   By: Lajean Manes M.D.   On: 06/30/2018 15:52   Result Date: 06/30/2018 CLINICAL DATA:  Patient presents for stereotactic core needle biopsy of left breast calcifications. EXAM: LEFT BREAST STEREOTACTIC CORE NEEDLE BIOPSY COMPARISON:  Previous exams. FINDINGS: The patient and I discussed the procedure of stereotactic-guided biopsy including benefits and alternatives. We discussed the high likelihood of a successful procedure.  We discussed the risks of the procedure including infection, bleeding, tissue injury, clip migration, and inadequate sampling. Informed written consent was given. The usual time out protocol was performed immediately prior to the procedure. Using sterile technique and 1% Lidocaine as local anesthetic, under stereotactic guidance, a 9 gauge vacuum assisted device was used to perform core needle biopsy of calcifications in the upper outer quadrant of the left breast using a lateral approach. Specimen radiograph was performed showing multiple calcifications for which biopsy was performed. Specimens with calcifications are identified for pathology. Lesion quadrant: Upper outer quadrant At the conclusion of the procedure, a coil shaped tissue marker clip was deployed into the biopsy cavity. Follow-up 2-view mammogram was performed and dictated separately. IMPRESSION: Stereotactic-guided biopsy of left breast calcifications. No apparent complications. Electronically Signed: By: Lajean Manes M.D. On: 06/29/2018 10:22       IMPRESSION/PLAN: 1. 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 Dr. Lisbeth Renshaw discusses the pathology findings and reviews the nature of invasive left breast disease.  We discussed the role of meeting with medical oncology and anticipate she will have oncotype testing and adjuvant antiestrogen therapy. She would benefit from  adjuvant radiotherapy if she chose lumpectomy. We discussed the risks, benefits, short, and long term effects of radiotherapy, and the patient is interested in proceeding. Dr. Lisbeth Renshaw discusses the delivery and logistics of radiotherapy and anticipates a course of 6 1/2 weeks of radiotherapy if she elects for lumpectomy. We would follow along with her surgical pathology but at this time would not anticipate a role of radiotherapy if she proceeded with mastectomy. We will see her back about 2 weeks after surgery to discuss the simulation process and anticipate we starting radiotherapy about 4-6 weeks after surgery.  2. Possible genetic predisposition to malignancy. The patient is a candidate for genetic testing given her personal and family history. She was offered referral and is interested as these results may play a role in her surgical decision making.  This encounter was provided by telemedicine platform Webex.  The patient has given verbal consent for this type of encounter and has been advised to only accept a meeting of this type in a secure network environment. The time spent during this encounter was 60 minutes. The attendants for this meeting include Blenda Nicely, RN, Dr. Lisbeth Renshaw, Hayden Pedro  and [name] and Benjaman Lobe.  During the encounter,  Blenda Nicely, RN, Dr. Lisbeth Renshaw, and Hayden Pedro were located at [facility].  [name] and Benjaman Lobe were located at home.    The above documentation reflects my direct findings during this shared patient visit. Please see the separate note by Dr. Lisbeth Renshaw on this date for the remainder of the patient's plan of care.    Carola Rhine, PAC

## 2018-07-11 ENCOUNTER — Other Ambulatory Visit: Payer: Self-pay

## 2018-07-11 ENCOUNTER — Inpatient Hospital Stay: Payer: BC Managed Care – PPO | Attending: Oncology | Admitting: Licensed Clinical Social Worker

## 2018-07-11 ENCOUNTER — Inpatient Hospital Stay: Payer: BC Managed Care – PPO

## 2018-07-11 ENCOUNTER — Other Ambulatory Visit: Payer: Self-pay | Admitting: *Deleted

## 2018-07-11 ENCOUNTER — Encounter: Payer: Self-pay | Admitting: Licensed Clinical Social Worker

## 2018-07-11 DIAGNOSIS — C50412 Malignant neoplasm of upper-outer quadrant of left female breast: Secondary | ICD-10-CM

## 2018-07-11 DIAGNOSIS — Z170 Estrogen receptor positive status [ER+]: Secondary | ICD-10-CM | POA: Insufficient documentation

## 2018-07-11 DIAGNOSIS — Z8042 Family history of malignant neoplasm of prostate: Secondary | ICD-10-CM

## 2018-07-11 DIAGNOSIS — Z79899 Other long term (current) drug therapy: Secondary | ICD-10-CM | POA: Insufficient documentation

## 2018-07-11 DIAGNOSIS — I10 Essential (primary) hypertension: Secondary | ICD-10-CM | POA: Diagnosis not present

## 2018-07-11 DIAGNOSIS — Z7951 Long term (current) use of inhaled steroids: Secondary | ICD-10-CM | POA: Insufficient documentation

## 2018-07-11 DIAGNOSIS — Z801 Family history of malignant neoplasm of trachea, bronchus and lung: Secondary | ICD-10-CM | POA: Insufficient documentation

## 2018-07-11 DIAGNOSIS — J45909 Unspecified asthma, uncomplicated: Secondary | ICD-10-CM | POA: Diagnosis not present

## 2018-07-11 DIAGNOSIS — Z803 Family history of malignant neoplasm of breast: Secondary | ICD-10-CM | POA: Insufficient documentation

## 2018-07-11 LAB — CBC WITH DIFFERENTIAL (CANCER CENTER ONLY)
Abs Immature Granulocytes: 0.03 10*3/uL (ref 0.00–0.07)
Basophils Absolute: 0.1 10*3/uL (ref 0.0–0.1)
Basophils Relative: 1 %
Eosinophils Absolute: 0.2 10*3/uL (ref 0.0–0.5)
Eosinophils Relative: 2 %
HCT: 40 % (ref 36.0–46.0)
Hemoglobin: 13 g/dL (ref 12.0–15.0)
Immature Granulocytes: 0 %
Lymphocytes Relative: 28 %
Lymphs Abs: 2.4 10*3/uL (ref 0.7–4.0)
MCH: 26.1 pg (ref 26.0–34.0)
MCHC: 32.5 g/dL (ref 30.0–36.0)
MCV: 80.3 fL (ref 80.0–100.0)
Monocytes Absolute: 0.9 10*3/uL (ref 0.1–1.0)
Monocytes Relative: 10 %
Neutro Abs: 5.1 10*3/uL (ref 1.7–7.7)
Neutrophils Relative %: 59 %
Platelet Count: 289 10*3/uL (ref 150–400)
RBC: 4.98 MIL/uL (ref 3.87–5.11)
RDW: 13.6 % (ref 11.5–15.5)
WBC Count: 8.7 10*3/uL (ref 4.0–10.5)
nRBC: 0 % (ref 0.0–0.2)

## 2018-07-11 LAB — CMP (CANCER CENTER ONLY)
ALT: 13 U/L (ref 0–44)
AST: 11 U/L — ABNORMAL LOW (ref 15–41)
Albumin: 4.1 g/dL (ref 3.5–5.0)
Alkaline Phosphatase: 70 U/L (ref 38–126)
Anion gap: 12 (ref 5–15)
BUN: 16 mg/dL (ref 6–20)
CO2: 27 mmol/L (ref 22–32)
Calcium: 9.4 mg/dL (ref 8.9–10.3)
Chloride: 103 mmol/L (ref 98–111)
Creatinine: 1 mg/dL (ref 0.44–1.00)
GFR, Est AFR Am: >60 mL/min (ref >60)
GFR, Estimated: >60 mL/min (ref >60)
Glucose, Bld: 93 mg/dL (ref 70–99)
Potassium: 3.6 mmol/L (ref 3.5–5.1)
Sodium: 142 mmol/L (ref 135–145)
Total Bilirubin: 0.4 mg/dL (ref 0.3–1.2)
Total Protein: 7.9 g/dL (ref 6.5–8.1)

## 2018-07-11 NOTE — Progress Notes (Signed)
REFERRING PROVIDER: Erroll Luna, MD [address] [place],  [place] [address]  PRIMARY PROVIDER:  Leeroy Cha, MD  PRIMARY REASON FOR VISIT:  1. Malignant neoplasm of upper-outer quadrant of left breast in female, estrogen receptor positive ([facility])   2. Family history of breast cancer   3. Family history of prostate cancer   4. Family history of lung cancer    I connected with Veronica Reilly on 07/11/2018 at 10:00 AM EDT by Webex and verified that I am speaking with the correct person using two identifiers.    Patient location: home Provider location: clinic    HISTORY OF PRESENT ILLNESS:   Veronica Reilly, a 53 y.o. female, was seen for a [facility] cancer genetics consultation at the request of Dr. Brantley Stage due to her recent diagnosis of breast cancer and family history of cancer.  Veronica Reilly presents to clinic today to discuss the possibility of a hereditary predisposition to cancer, genetic testing, and to further clarify her future cancer risks, as well as potential cancer risks for family members.   In 2020, at the age of 81, Veronica Reilly was diagnosed with IDC of the left breast. The treatment plan includes breast conserving surgery, she notes genetic testing will help her make a surgical decision. She will meet with Dr. Jana Hakim on 07/13/2018.  CANCER HISTORY:  Oncology History   No history exists.   RISK FACTORS:  Menarche was at age 87.  First live birth at age no children.  OCP use for approximately 6 months Ovaries intact: yes.  Hysterectomy: no.  Menopausal status: perimenopausal.  HRT use: 0 years. Colonoscopy: no; not examined. Mammogram within the last year: yes.  Past Medical History:  Diagnosis Date  . Anemia   . ASCUS (atypical squamous cells of undetermined significance) on Pap smear   . Asthma   . Dysplasia of cervix, low grade (CIN 1)   . Eczema   . Family history of breast cancer   . Family history of lung cancer   . Family  history of prostate cancer   . History of chicken pox   . Hypertension   . Hypothyroid   . Uterine fibroid     Past Surgical History:  Procedure Laterality Date  . ADENOIDECTOMY    . LEEP    . MYOMECTOMY    . TONSILECTOMY, ADENOIDECTOMY, BILATERAL MYRINGOTOMY AND TUBES    . TONSILLECTOMY      Social History   Socioeconomic History  . Marital status: Married    Spouse name: Not on file  . Number of children: Not on file  . Years of education: Not on file  . Highest education level: Not on file  Occupational History  . Not on file  Social Needs  . Financial resource strain: Not on file  . Food insecurity    Worry: Not on file    Inability: Not on file  . Transportation needs    Medical: No    Non-medical: No  Tobacco Use  . Smoking status: Never Smoker  . Smokeless tobacco: Never Used  Substance and Sexual Activity  . Alcohol use: No  . Drug use: No  . Sexual activity: Yes    Birth control/protection: None  Lifestyle  . Physical activity    Days per week: Not on file    Minutes per session: Not on file  . Stress: Not on file  Relationships  . Social connections    Talks on phone: Not on file  Gets together: Not on file    Attends religious service: Not on file    Active member of club or organization: Not on file    Attends meetings of clubs or organizations: Not on file    Relationship status: Not on file  Other Topics Concern  . Not on file  Social History Narrative  . Not on file     FAMILY HISTORY:  We obtained a detailed, 4-generation family history.  Significant diagnoses are listed below: Family History  Problem Relation Age of Onset  . Breast cancer Maternal Grandmother   . Heart disease Father   . Diabetes Father   . Prostate cancer Father 28  . Breast cancer Mother 51  . Hypertension Other   . Lung cancer Maternal Aunt   . Allergic rhinitis Neg Hx   . Angioedema Neg Hx   . Asthma Neg Hx   . Eczema Neg Hx   . Immunodeficiency Neg Hx    . Urticaria Neg Hx    Veronica Reilly does not have children. She has one brother, 67, no history of cancer. She has one niece, 27, no history of cancer.  Veronica Reilly mother was diagnosed with breast cancer at 91 and died at 48. The patient had 5 maternal uncles, 3 maternal aunts. One of her aunts had lung cancer and died at 38, history of smoking. No known cancers in her maternal cousins. Her maternal grandmother also had breast cancer and died at 85. She does not have info about her maternal grandfather.  Veronica Reilly father was diagnosed with prostate cancer at 36 and died at 77. The patient has 4 paternal uncles, 4 paternal aunts. She believes one of her aunts had a GYN cancer, but she is unsure. No known cancers in paternal cousins. Her paternal grandmother died at 75, grandfather died at 34, no cancers.  Veronica Reilly is unaware of previous family history of genetic testing for hereditary cancer risks. There is no reported Ashkenazi Jewish ancestry. There is no known consanguinity.  GENETIC COUNSELING ASSESSMENT: Veronica Reilly is a 53 y.o. female with a personal and family history which is somewhat suggestive of a hereditary cancer syndrome and predisposition to cancer. We, therefore, discussed and recommended the following at today's visit.   DISCUSSION: We discussed that 5 - 10% of breast cancer is hereditary, with most cases associated with the BRCA1/BRCA2 genes.  There are other genes that can be associated with hereditary breast cancer syndromes.  These include PALB2, CHEK2, ATM.  We reviewed the characteristics, features and inheritance patterns of hereditary cancer syndromes. We also discussed genetic testing, including the appropriate family members to test, the process of testing, insurance coverage and turn-around-time for results. We discussed the implications of a negative, positive and/or variant of uncertain significant result. In order to get genetic test results in a timely  manner so that Veronica Reilly can use these genetic test results for surgical decisions, we recommended Veronica Reilly pursue genetic testing for the Breast Cancer STAT Panel. Once complete, we recommend Veronica Reilly pursue reflex genetic testing to the Common Hereditary Cancers gene panel.   The STAT Breast cancer panel offered by [facility] includes sequencing and rearrangement analysis for the following 9 genes:  ATM, BRCA1, BRCA2, CDH1, CHEK2, PALB2, PTEN, STK11 and TP53.    The Common Hereditary Cancers Panel offered by [facility] includes sequencing and/or deletion duplication testing of the following 48 genes: APC, ATM, AXIN2, BARD1, BMPR1A, BRCA1, BRCA2, BRIP1, CDH1, CDKN2A (p14ARF), CDKN2A (p16INK4a), CKD4,  CHEK2, CTNNA1, DICER1, EPCAM (Deletion/duplication testing only), GREM1 (promoter region deletion/duplication testing only), KIT, MEN1, MLH1, MSH2, MSH3, MSH6, MUTYH, NBN, NF1, NHTL1, PALB2, PDGFRA, PMS2, POLD1, POLE, PTEN, RAD50, RAD51C, RAD51D, RNF43, SDHB, SDHC, SDHD, SMAD4, SMARCA4. STK11, TP53, TSC1, TSC2, and VHL.  The following genes were evaluated for sequence changes only: SDHA and HOXB13 c.251G>A variant only.  Based on Ms. Spitzley's personal and family history of cancer, she meets medical criteria for genetic testing. Despite that she meets criteria, she may still have an out of pocket cost.   PLAN: After considering the risks, benefits, and limitations, Veronica Reilly provided informed consent to pursue genetic testing and the blood sample was sent to [facility] for analysis of the STAT Panel + Common Hereditary Cancer Panel. Initial results should be available within approximately 1 weeks' time, at which point they will be disclosed by telephone to Veronica Reilly, as will any additional recommendations warranted by these results. Veronica Reilly will receive a summary of her genetic counseling visit and a copy of her results once available. This information will also be available in Epic.    Lastly, we encouraged Veronica Reilly to remain in contact with cancer genetics annually so that we can continuously update the family history and inform her of any changes in cancer genetics and testing that may be of benefit for this family.   Veronica Reilly questions were answered to her satisfaction today. Our contact information was provided should additional questions or concerns arise. Thank you for the referral and allowing [place] to share in the care of your patient.   Veronica Rogue, MS Genetic Counselor [email or web address] Phone: [phone]  The patient was seen for a total of 35 minutes in virtual genetic counseling.  Drs. Magrinat, Lindi Adie and/or Burr Medico were available for discussion regarding this case.

## 2018-07-13 ENCOUNTER — Other Ambulatory Visit: Payer: Self-pay

## 2018-07-13 ENCOUNTER — Encounter: Payer: Self-pay | Admitting: *Deleted

## 2018-07-13 ENCOUNTER — Encounter: Payer: Self-pay | Admitting: Oncology

## 2018-07-13 ENCOUNTER — Other Ambulatory Visit: Payer: BC Managed Care – PPO

## 2018-07-13 ENCOUNTER — Inpatient Hospital Stay (HOSPITAL_BASED_OUTPATIENT_CLINIC_OR_DEPARTMENT_OTHER): Payer: BC Managed Care – PPO | Admitting: Oncology

## 2018-07-13 VITALS — BP 121/81 | HR 83 | Temp 98.6°F | Resp 16 | Ht 64.5 in | Wt 216.0 lb

## 2018-07-13 DIAGNOSIS — C50412 Malignant neoplasm of upper-outer quadrant of left female breast: Secondary | ICD-10-CM

## 2018-07-13 DIAGNOSIS — J45909 Unspecified asthma, uncomplicated: Secondary | ICD-10-CM | POA: Diagnosis not present

## 2018-07-13 DIAGNOSIS — Z170 Estrogen receptor positive status [ER+]: Secondary | ICD-10-CM | POA: Diagnosis not present

## 2018-07-13 DIAGNOSIS — Z79899 Other long term (current) drug therapy: Secondary | ICD-10-CM

## 2018-07-13 DIAGNOSIS — Z7951 Long term (current) use of inhaled steroids: Secondary | ICD-10-CM

## 2018-07-13 DIAGNOSIS — I10 Essential (primary) hypertension: Secondary | ICD-10-CM

## 2018-07-13 DIAGNOSIS — Z803 Family history of malignant neoplasm of breast: Secondary | ICD-10-CM

## 2018-07-13 NOTE — Progress Notes (Signed)
[place]  [phone] [phone]     ID: Marcelline Temkin DOB: 1966-01-13  MR#: [id number]  QTM#:[id number]  Patient Care Team: Leeroy Cha, MD as PCP - General (Internal Medicine) Bret Vanessen, Virgie Dad, MD as Consulting Physician (Oncology) Kyung Rudd, MD as Consulting Physician (Radiation Oncology) Erroll Luna, MD as Consulting Physician (General Surgery) Delsa Bern, MD as Consulting Physician (Obstetrics and Gynecology) Kennith Gain, MD as Consulting Physician (Allergy) Delrae Rend, MD as Consulting Physician (Endocrinology) Chauncey Cruel, MD OTHER MD:  CHIEF COMPLAINT: estrogen receptor positive breast cancer  CURRENT TREATMENT: Awaiting definitive surgery   HISTORY OF CURRENT ILLNESS: Emaree Chiu had some calcifications in the left breast noted March 2019 on screening mammography.. She underwent biopsy of the left retroareolar area of concern on 03/18/2017 with pathology ([id number]) showing fibrocystic changes with no malignancy. Return in 6 months for left diagnostic mammography and ultrasonography was recommended, and she reports this took place in November 2019 at her gynecologist's office and the changes previously noted were stable.  She underwent bilateral diagnostic mammography with tomography and left breast ultrasonography at [facility] on 06/27/2018 showing: breast density category C; left breast upper-outer quadrant 3.1 cm group of indeterminate calcifications; persistent benign-appearing cyst in the subareolar left breast, post core needle biopsy changes.  On physical exam there were no palpable masses.  Targeted ultrasound revealed no suspicious masses.  There was a left subareolar breast cyst measuring 1.0 cm.  Left axilla was unremarkable.  Accordingly on 06/29/2018 she proceeded to biopsy of the left breast calcifications area in question. The pathology from this procedure ([id number])  showed: invasive ductal carcinoma, grade 2, with ductal carcinoma in situ. Prognostic indicators significant for: estrogen receptor, 100% positive with strong staining intensity and progesterone receptor, 0% negative. Proliferation marker Ki67 at 10%. HER2 negtive by immunohistochemistry, (0).  The patient's subsequent history is as detailed below.   INTERVAL HISTORY: Khailee was evaluated in the breast cancer clinic on 07/13/2018. Her case was also presented at the multidisciplinary breast cancer conference on 07/05/2018. At that time a preliminary plan was proposed: Breast conserving surgery, genetics, Oncotype, adjuvant radiation, antiestrogens.   REVIEW OF SYSTEMS: Carrington reports she walks for about 30 minutes a couple times a week. The patient denies unusual headaches, visual changes, nausea, vomiting, stiff neck, dizziness, or gait imbalance.  She has a history of breast murmur which she says has been previously worked up and is "not important".There has been no cough, phlegm production, or pleurisy, no chest pain or pressure, and no change in bowel or bladder habits. The patient denies fever, rash, bleeding, unexplained fatigue or unexplained weight loss. A detailed review of systems was otherwise entirely negative.   PAST MEDICAL HISTORY: Past Medical History:  Diagnosis Date   Anemia    ASCUS (atypical squamous cells of undetermined significance) on Pap smear    Asthma    controlled with daily inhaler   Breast cancer ([place]) 06/29/2018   Left IDC   Dysplasia of cervix, low grade (CIN 1)    Eczema    Family history of breast cancer    Family history of lung cancer    Family history of prostate cancer    Heart murmur    History of chicken pox    Hypertension    Hypothyroid    Uterine fibroid   Heart murmur, evaluated by cardiologist   PAST SURGICAL HISTORY: Past Surgical History:  Procedure Laterality Date   ADENOIDECTOMY  LEEP     MYOMECTOMY      TONSILECTOMY, ADENOIDECTOMY, BILATERAL MYRINGOTOMY AND TUBES     TONSILLECTOMY      FAMILY HISTORY: Family History  Problem Relation Age of Onset   Breast cancer Maternal Grandmother    Heart disease Father    Diabetes Father    Prostate cancer Father 42   Breast cancer Mother 18   Hypertension Other    Lung cancer Maternal Aunt    Allergic rhinitis Neg Hx    Angioedema Neg Hx    Asthma Neg Hx    Eczema Neg Hx    Immunodeficiency Neg Hx    Urticaria Neg Hx    Patient's father was 40 years old when he died from diabetes and renal failure. Patient's mother died from breast cancer at age 37. The patient notes a family hx of breast and possibly ovarian cancer. Her mother was diagnosed with inflammatory breast cancer at around age 9 (she was a patient here), and the patient maternal grandmother was also diagnosed with breast cancer and died at age 8.  The patient has 1 brother, no sisters. She also had one maternal aunt with lung cancer and a history of smoking, her father with prostate cancer at age 21, and a possible GYN cancer in a paternal aunt.   GYNECOLOGIC HISTORY:  Last menstrual period February 2020  menarche: 53 years old GX P 0, she has had fertility treatments LMP 05/2018, lasted 3 days, irregular (s/p ablation) Contraceptive: used for apprx. 6 months HRT never used  Hysterectomy? no BSO? 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 She is married. Husband Celesta Gentile is a trauma nurse at [employer]. She lives at home with her husband and some hermit crabs and salt and fresh water fish. 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    ADVANCED DIRECTIVES: Husband Celesta Gentile is her HCPOA.   HEALTH MAINTENANCE: Social History   Tobacco Use   Smoking status: Never Smoker   Smokeless tobacco: Never Used  Substance Use Topics    Alcohol use: No   Drug use: No     Colonoscopy: Pending  PAP: Up-to-date  Bone density: never done   Allergies  Allergen Reactions   Apple Anaphylaxis   Iohexol     Contrast dye   Other Shortness Of Breath    CHERRIES, TREES, MOLD,DUST, COCKROACHES   Povidone-Iodine Anaphylaxis   Shellfish Allergy Anaphylaxis   Peach [Prunus Persica] Itching and Hives    mouth   Zocor [Simvastatin] Other (See Comments)    Alopecia   Betadine [Povidone Iodine]     Due to shellfish allergy   Latex Rash    Current Outpatient Medications  Medication Sig Dispense Refill   cholecalciferol (VITAMIN D) 1000 UNITS tablet Take 2,000 Units by mouth daily.      folic acid (FOLVITE) 1 MG tablet Take 1 mg by mouth daily.     hydrochlorothiazide (MICROZIDE) 12.5 MG capsule Take 12.5 mg by mouth every morning.  1   levothyroxine (SYNTHROID, LEVOTHROID) 112 MCG tablet      montelukast (SINGULAIR) 10 MG tablet Take 1 tablet (10 mg total) by mouth at bedtime. 30 tablet 5   NIFEdipine (PROCARDIA XL/ADALAT-CC) 60 MG 24 hr tablet Take 60 mg by mouth daily.  1   Olopatadine HCl (PAZEO) 0.7 % SOLN Apply 1 drop to eye  daily. 2.5 mL 5   albuterol (VENTOLIN HFA) 108 (90 Base) MCG/ACT inhaler Use 2 puffs every four hours as needed for cough or wheeze.  May use 2 puffs 10-20 minutes prior to exercise. (Patient not taking: Reported on 07/13/2018) 1 Inhaler 1   budesonide-formoterol (SYMBICORT) 160-4.5 MCG/ACT inhaler INHALE 2 PUFFS INTO THE LUNGS 2 (TWO) TIMES DAILY. (Patient not taking: Reported on 07/13/2018) 10.2 g 5   cetirizine (ZYRTEC) 10 MG tablet Take 10 mg by mouth daily.     EPINEPHrine (EPIPEN 2-PAK) 0.3 mg/0.3 mL IJ SOAJ injection Use as directed for a threatening allergic reaction (Patient not taking: Reported on 07/13/2018) 4 Device 1   fluticasone (FLONASE) 50 MCG/ACT nasal spray USE 2 SPRAYS IN EACH NOSTRIL ONCE DAILY FOR STUFFY NOSE OR DRAINAGE (Patient not taking: Reported on 07/13/2018)  16 g 5   triamcinolone cream (KENALOG) 0.1 % Apply 1 application topically daily as needed. (Patient not taking: Reported on 07/13/2018) 80 g 2   No current facility-administered medications for this visit.     OBJECTIVE: Middle-aged African-American woman in no acute distress  Vitals:   07/13/18 1554  BP: 121/81  Pulse: 83  Resp: 16  Temp: 98.6 F (37 C)  SpO2: 100%     Body mass index is 36.5 kg/m.   Wt Readings from Last 3 Encounters:  07/13/18 216 lb (98 kg)  12/08/17 229 lb 6.4 oz (104.1 kg)  01/26/16 232 lb 6.4 oz (105.4 kg)      ECOG FS:1 - Symptomatic but completely ambulatory  Ocular: Sclerae unicteric, pupils round and equal Wearing a mask Lymphatic: No cervical or supraclavicular adenopathy Lungs no rales or rhonchi Heart regular rate and rhythm, 2/6 systolic ejection murmur Abd soft, nontender, positive bowel sounds MSK no focal spinal tenderness, no joint edema Neuro: non-focal, well-oriented, appropriate affect Breasts: I do not palpate a mass in either breast.  There are no skin or nipple changes of concern.  Both axillae are benign.   LAB RESULTS:  CMP     Component Value Date/Time   NA 142 07/11/2018 1235   K 3.6 07/11/2018 1235   CL 103 07/11/2018 1235   CO2 27 07/11/2018 1235   GLUCOSE 93 07/11/2018 1235   BUN 16 07/11/2018 1235   CREATININE 1.00 07/11/2018 1235   CALCIUM 9.4 07/11/2018 1235   PROT 7.9 07/11/2018 1235   ALBUMIN 4.1 07/11/2018 1235   AST 11 (L) 07/11/2018 1235   ALT 13 07/11/2018 1235   ALKPHOS 70 07/11/2018 1235   BILITOT 0.4 07/11/2018 1235   GFRNONAA >60 07/11/2018 1235   GFRAA >60 07/11/2018 1235    No results found for: TOTALPROTELP, ALBUMINELP, A1GS, A2GS, BETS, BETA2SER, GAMS, MSPIKE, SPEI  No results found for: KPAFRELGTCHN, LAMBDASER, KAPLAMBRATIO  Lab Results  Component Value Date   WBC 8.7 07/11/2018   NEUTROABS 5.1 07/11/2018   HGB 13.0 07/11/2018   HCT 40.0 07/11/2018   MCV 80.3 07/11/2018   PLT 289  07/11/2018    '@LASTCHEMISTRY'$ @  No results found for: LABCA2  No components found for: [id number]  No results for input(s): INR in the last 168 hours.  No results found for: LABCA2  No results found for: KZL935  No results found for: TSV779  No results found for: TJQ300  No results found for: CA2729  No components found for: HGQUANT  No results found for: CEA1 / No results found for: CEA1   No results found for: AFPTUMOR  No results found for: [facility]  No results found for: PSA1  Appointment on 07/11/2018  Component Date Value Ref Range Status   Sodium 07/11/2018 142  135 - 145 mmol/L Final   Potassium 07/11/2018 3.6  3.5 - 5.1 mmol/L Final   Chloride 07/11/2018 103  98 - 111 mmol/L Final   CO2 07/11/2018 27  22 - 32 mmol/L Final   Glucose, Bld 07/11/2018 93  70 - 99 mg/dL Final   BUN 07/11/2018 16  6 - 20 mg/dL Final   Creatinine 07/11/2018 1.00  0.44 - 1.00 mg/dL Final   Calcium 07/11/2018 9.4  8.9 - 10.3 mg/dL Final   Total Protein 07/11/2018 7.9  6.5 - 8.1 g/dL Final   Albumin 07/11/2018 4.1  3.5 - 5.0 g/dL Final   AST 07/11/2018 11* 15 - 41 U/L Final   ALT 07/11/2018 13  0 - 44 U/L Final   Alkaline Phosphatase 07/11/2018 70  38 - 126 U/L Final   Total Bilirubin 07/11/2018 0.4  0.3 - 1.2 mg/dL Final   GFR, Est Non Af Am 07/11/2018 >60  >60 mL/min Final   GFR, Est AFR Am 07/11/2018 >60  >60 mL/min Final   Anion gap 07/11/2018 12  5 - 15 Final   Performed at [facility], [place] [address], [place], [place] [address]   WBC Count 07/11/2018 8.7  4.0 - 10.5 K/uL Final   RBC 07/11/2018 4.98  3.87 - 5.11 MIL/uL Final   Hemoglobin 07/11/2018 13.0  12.0 - 15.0 g/dL Final   HCT 07/11/2018 40.0  36.0 - 46.0 % Final   MCV 07/11/2018 80.3  80.0 - 100.0 fL Final   MCH 07/11/2018 26.1  26.0 - 34.0 pg Final   MCHC 07/11/2018 32.5  30.0 - 36.0 g/dL Final   RDW 07/11/2018 13.6  11.5 - 15.5 % Final   Platelet Count  07/11/2018 289  150 - 400 K/uL Final   nRBC 07/11/2018 0.0  0.0 - 0.2 % Final   Neutrophils Relative % 07/11/2018 59  % Final   Neutro Abs 07/11/2018 5.1  1.7 - 7.7 K/uL Final   Lymphocytes Relative 07/11/2018 28  % Final   Lymphs Abs 07/11/2018 2.4  0.7 - 4.0 K/uL Final   Monocytes Relative 07/11/2018 10  % Final   Monocytes Absolute 07/11/2018 0.9  0.1 - 1.0 K/uL Final   Eosinophils Relative 07/11/2018 2  % Final   Eosinophils Absolute 07/11/2018 0.2  0.0 - 0.5 K/uL Final   Basophils Relative 07/11/2018 1  % Final   Basophils Absolute 07/11/2018 0.1  0.0 - 0.1 K/uL Final   Immature Granulocytes 07/11/2018 0  % Final   Abs Immature Granulocytes 07/11/2018 0.03  0.00 - 0.07 K/uL Final   Performed at [facility], [address] [place],  [address]    (this displays the last labs from the last 3 days)  No results found for: TOTALPROTELP, ALBUMINELP, A1GS, A2GS, BETS, BETA2SER, GAMS, MSPIKE, SPEI (this displays SPEP labs)  No results found for: KPAFRELGTCHN, LAMBDASER, KAPLAMBRATIO (kappa/lambda light chains)  No results found for: HGBA, HGBA2QUANT, HGBFQUANT, HGBSQUAN (Hemoglobinopathy evaluation)   No results found for: LDH  No results found for: IRON, TIBC, IRONPCTSAT (Iron and TIBC)  No results found for: FERRITIN  Urinalysis    Component Value Date/Time   COLORURINE YELLOW 05/15/2007 0957   APPEARANCEUR CLEAR 05/15/2007 0957   LABSPEC 1.010 05/15/2007 0957   PHURINE 8.0 05/15/2007 0957   GLUCOSEU NEGATIVE 05/15/2007 0957   HGBUR  NEGATIVE 05/15/2007 0957   BILIRUBINUR NEGATIVE 05/15/2007 0957   KETONESUR NEGATIVE 05/15/2007 0957   PROTEINUR NEGATIVE 05/15/2007 0957   UROBILINOGEN 0.2 05/15/2007 0957   NITRITE NEGATIVE 05/15/2007 0957   LEUKOCYTESUR  05/15/2007 0957    NEGATIVE MICROSCOPIC NOT DONE ON URINES WITH NEGATIVE PROTEIN, BLOOD, LEUKOCYTES, NITRITE, OR GLUCOSE <1000 mg/dL.     STUDIES: US Breast Ltd Uni Left  Inc Axilla  Result Date: 06/27/2018 CLINICAL DATA:  The patient has a history of ultrasound-guided core needle biopsy of left subareolar breast mass demonstrating fibrocystic changes. Following the biopsy, she was recommended to have a six-month follow-up with a mammogram and ultrasound. EXAM: DIGITAL DIAGNOSTIC BILATERAL MAMMOGRAM WITH CAD AND TOMO ULTRASOUND LEFT BREAST COMPARISON:  Previous exam(s). ACR Breast Density Category c: The breast tissue is heterogeneously dense, which may obscure small masses. FINDINGS: Mammographically, there are no suspicious masses, areas of architectural distortion or microcalcifications in the right breast. There is a persistent approximately 1 cm circumscribed mass in the immediately subareolar left breast, with a post biopsy marker adjacent to it. Additionally, there is a new group of pleomorphic microcalcifications in the left breast upper outer quadrant, far posterior depth, measuring 3.1 x 1.2 x 1.7 cm. Mammographic images were processed with CAD. On physical exam, no suspicious masses are palpated. Targeted ultrasound is performed, showing no suspicious masses or shadowing lesions. There is a left subareolar breast cyst which measures 1.0 x 0.6 x 1.0 cm. Examination of the left axilla demonstrates no evidence of lymphadenopathy. IMPRESSION: Left breast upper outer quadrant 3.1 cm group of indeterminate calcifications, for which stereotactic core needle biopsy is recommended. Persistent benign-appearing cyst in the subareolar left breast, post benign core needle biopsy demonstrating fibrocystic changes. No further imaging follow-up is recommended. RECOMMENDATION: Stereotactic core needle biopsy of the left breast. Given the linear extent of the group of calcifications of over 3 cm, a second stereotactic core needle biopsy will be necessary to determine the extent of disease, if pathology results demonstrate malignancy. I have discussed the findings and recommendations with  the patient. Results were also provided in writing at the conclusion of the visit. If applicable, a reminder letter will be sent to the patient regarding the next appointment. BI-RADS CATEGORY  4: Suspicious. Electronically Signed   By: Fidela Salisbury M.D.   On: 06/27/2018 16:00   Mm Diag Breast Tomo Bilateral  Result Date: 06/27/2018 CLINICAL DATA:  The patient has a history of ultrasound-guided core needle biopsy of left subareolar breast mass demonstrating fibrocystic changes. Following the biopsy, she was recommended to have a six-month follow-up with a mammogram and ultrasound. EXAM: DIGITAL DIAGNOSTIC BILATERAL MAMMOGRAM WITH CAD AND TOMO ULTRASOUND LEFT BREAST COMPARISON:  Previous exam(s). ACR Breast Density Category c: The breast tissue is heterogeneously dense, which may obscure small masses. FINDINGS: Mammographically, there are no suspicious masses, areas of architectural distortion or microcalcifications in the right breast. There is a persistent approximately 1 cm circumscribed mass in the immediately subareolar left breast, with a post biopsy marker adjacent to it. Additionally, there is a new group of pleomorphic microcalcifications in the left breast upper outer quadrant, far posterior depth, measuring 3.1 x 1.2 x 1.7 cm. Mammographic images were processed with CAD. On physical exam, no suspicious masses are palpated. Targeted ultrasound is performed, showing no suspicious masses or shadowing lesions. There is a left subareolar breast cyst which measures 1.0 x 0.6 x 1.0 cm. Examination of the left axilla demonstrates no evidence of lymphadenopathy. IMPRESSION: Left breast  upper outer quadrant 3.1 cm group of indeterminate calcifications, for which stereotactic core needle biopsy is recommended. Persistent benign-appearing cyst in the subareolar left breast, post benign core needle biopsy demonstrating fibrocystic changes. No further imaging follow-up is recommended. RECOMMENDATION:  Stereotactic core needle biopsy of the left breast. Given the linear extent of the group of calcifications of over 3 cm, a second stereotactic core needle biopsy will be necessary to determine the extent of disease, if pathology results demonstrate malignancy. I have discussed the findings and recommendations with the patient. Results were also provided in writing at the conclusion of the visit. If applicable, a reminder letter will be sent to the patient regarding the next appointment. BI-RADS CATEGORY  4: Suspicious. Electronically Signed   By: Fidela Salisbury M.D.   On: 06/27/2018 16:00   Mm Clip Placement Left  Result Date: 06/29/2018 CLINICAL DATA:  Status post stereotactic core needle biopsy of left breast calcifications. EXAM: DIAGNOSTIC LEFT MAMMOGRAM POST STEREOTACTIC BIOPSY COMPARISON:  Previous exam(s). FINDINGS: Mammographic images were obtained following stereotactic guided biopsy of left breast calcifications. The coil shaped biopsy clip has migrated. On the exaggerated craniocaudal view the clip is migrated medially by 6.9 cm, and on the mL view is migrated inferiorly by 2.7 cm. The biopsy cavity and post biopsy air extends across the calcifications, may be now removed, indicating correct sampling. IMPRESSION: Coil shaped biopsy clip is migrated from the upper-outer quadrant calcifications as detailed above. Final Assessment: Post Procedure Mammograms for Marker Placement Electronically Signed   By: Lajean Manes M.D.   On: 06/29/2018 10:32   Mm Lt Breast Bx W Loc Dev 1st Lesion Image Bx Spec Stereo Guide  Addendum Date: 06/30/2018   ADDENDUM REPORT: 06/30/2018 15:52 ADDENDUM: Pathology revealed GRADE II INVASIVE DUCTAL CARCINOMA, DUCTAL CARCINOMA IN SITU of the LEFT breast, upper, outer quadrant. This was found to be concordant by Dr. Lajean Manes. Pathology results were discussed with the patient and her husband by telephone. The patient reported doing well after the biopsy with  tenderness at the site. Post biopsy instructions and care were reviewed and questions were answered. The patient was encouraged to call [facility] for any additional concerns. Surgical consultation has been arranged with Dr. Erroll Luna at [facility] on July 03, 2018. Pathology results reported by Stacie Acres, RN on 06/30/2018. Electronically Signed   By: Lajean Manes M.D.   On: 06/30/2018 15:52   Result Date: 06/30/2018 CLINICAL DATA:  Patient presents for stereotactic core needle biopsy of left breast calcifications. EXAM: LEFT BREAST STEREOTACTIC CORE NEEDLE BIOPSY COMPARISON:  Previous exams. FINDINGS: The patient and I discussed the procedure of stereotactic-guided biopsy including benefits and alternatives. We discussed the high likelihood of a successful procedure. We discussed the risks of the procedure including infection, bleeding, tissue injury, clip migration, and inadequate sampling. Informed written consent was given. The usual time out protocol was performed immediately prior to the procedure. Using sterile technique and 1% Lidocaine as local anesthetic, under stereotactic guidance, a 9 gauge vacuum assisted device was used to perform core needle biopsy of calcifications in the upper outer quadrant of the left breast using a lateral approach. Specimen radiograph was performed showing multiple calcifications for which biopsy was performed. Specimens with calcifications are identified for pathology. Lesion quadrant: Upper outer quadrant At the conclusion of the procedure, a coil shaped tissue marker clip was deployed into the biopsy cavity. Follow-up 2-view mammogram was performed and dictated separately. IMPRESSION: Stereotactic-guided biopsy  of left breast calcifications. No apparent complications. Electronically Signed: By: Lajean Manes M.D. On: 06/29/2018 10:22    ELIGIBLE FOR AVAILABLE RESEARCH PROTOCOL: no  ASSESSMENT: 53 y.o.  [place]  status post left breast upper outer quadrant biopsy 06/29/2018 for a clinical T2N0 invasive ductal carcinoma, grade 2, estrogen receptor positive, progesterone receptor negative, with an MIB-1-1 of 10%, and no HER-2 amplification.  (1) genetics 07/11/2018  (2) definitive surgery pending   (3) Oncotype to be obtained from final pathology   (4) adjuvant radiation as appropriate  (5) antiestrogens to follow  PLAN: I spent approximately 60 minutes face to face with [facility] with more than 50% of that time spent in counseling and coordination of care. Specifically we reviewed the biology of the patient's diagnosis and the specifics of her situation. We first reviewed the fact that cancer is not one disease but more than 100 different diseases and that it is important to keep them separate-- otherwise when friends and relatives discuss their own cancer experiences with [facility] confusion can result. Similarly we explained that if breast cancer spreads to the bone or liver, the patient would not have bone cancer or liver cancer, but breast cancer in the bone and breast cancer in the liver: one cancer in three places-- not 3 different cancers which otherwise would have to be treated in 3 different ways.  We discussed the difference between local and systemic therapy. In terms of loco-regional treatment, lumpectomy plus radiation is equivalent to mastectomy as far as survival is concerned. For this reason, and because the cosmetic results are generally superior, we recommend breast conserving surgery.   We then discussed the rationale for systemic therapy. There is some risk that this cancer may have already spread to other parts of her body. Patients frequently ask at this point about bone scans, CAT scans and PET scans to find out if they have occult breast cancer somewhere else. The problem is that in early stage disease we are much more likely to find false positives then true cancers and this would expose the  patient to unnecessary procedures as well as unnecessary radiation. Scans cannot answer the question the patient really would like to know, which is whether she has microscopic disease elsewhere in her body. For those reasons we do not recommend them.  Of course we would proceed to aggressive evaluation of any symptoms that might suggest metastatic disease, but that is not the case here.  Next we went over the options for systemic therapy which are anti-estrogens, anti-HER-2 immunotherapy, and chemotherapy. Rosalea does not meet criteria for anti-HER-2 immunotherapy. She is a good candidate for anti-estrogens.  The question of chemotherapy is more complicated. Chemotherapy is most effective in rapidly growing, aggressive tumors. It is much less effective in slow growing cancers, like Maddyx 's. For that reason we are going to request an Oncotype from the definitive surgical sample, as suggested by NCCN guidelines. That will help [place] make a definitive decision regarding chemotherapy in this case.  Shaquayla also meets criteria for genetics testing.In patients who carry a deleterious mutation [for example in a  BRCA gene], the risk of a new breast cancer developing in the future may be sufficiently great that the patient may choose bilateral mastectomies. However if she wishes to keep her breasts in that situation it is safe to do so. That would require intensified screening, which generally means not only yearly mammography but a yearly breast MRI as well.   At this point then  the plan is for definitive surgery, Oncotype testing, I think it is more likely than not that she will need chemo, radiation, and antiestrogens.  Virgilene has a good understanding of the overall plan. She agrees with it. She knows the goal of treatment in her case is cure. She will call with any problems that may develop before her next visit here.   Chauncey Cruel, MD   07/13/2018 4:59 PM Medical Oncology and Hematology [facility] [address] [place], [place] [address] Tel. [phone]    Fax. [phone]   This document serves as a record of services personally performed by Lurline Del, MD. It was created on his behalf by Wilburn Mylar, a trained medical scribe. The creation of this record is based on the scribe's personal observations and the provider's statements to them.   I, Lurline Del MD, have reviewed the above documentation for accuracy and completeness, and I agree with the above.

## 2018-07-14 ENCOUNTER — Telehealth: Payer: Self-pay | Admitting: *Deleted

## 2018-07-14 ENCOUNTER — Ambulatory Visit: Payer: Self-pay | Admitting: Surgery

## 2018-07-14 ENCOUNTER — Telehealth: Payer: Self-pay | Admitting: Oncology

## 2018-07-14 NOTE — Telephone Encounter (Signed)
Received call from patient stating she has some questions regarding her appointment yesterday with Dr. Jana Hakim.  Answered her questions and encouraged her to call with any needs or concerns.Patient verbalized understanding.

## 2018-07-14 NOTE — Telephone Encounter (Signed)
I talk with patient regarding schedule  

## 2018-07-17 ENCOUNTER — Ambulatory Visit: Payer: Self-pay | Admitting: Surgery

## 2018-07-17 NOTE — H&P (Signed)
[name] Documented: 07/17/2018 2:49 PM Location: [facility] Patient #: [id number] DOB: 13-Sep-1965 Married / Language: English / Race: Black or African American Female  History of Present Illness Marcello Moores A. Louellen Haldeman MD; 07/17/2018 4:11 PM) Patient words: Patient returns after meeting with medical and radiation oncology about her stage II left breast cancer. She is undergoing genetic testing and his results should be back soon. She had questions about her surgery needs today to discuss this. I reviewed the surgical options available to her for breast conservation to mastectomy and reconstruction and the pros and cons of each as well as long-term survival of each operation with treatment and recurrence rates with each. She is opted to continue with left breast lumpectomy with sentinel lymph node mapping. Oncology plans to do further testing of the tumor after surgery.  The patient is a 53 year old female.   Problem List/Past Medical Sharyn Lull R. Brooks, CMA; 07/17/2018 2:50 PM) BREAST CANCER, LEFT (V78.469)  Past Surgical History Sharyn Lull R. Brooks, CMA; 07/17/2018 2:50 PM) Breast Biopsy Left. multiple Foot Surgery Left. Oral Surgery  Diagnostic Studies History Sharyn Lull R. Rolena Infante, CMA; 07/17/2018 2:50 PM) Colonoscopy never Mammogram within last year Pap Smear 1-5 years ago  Allergies Sharyn Lull R. Brooks, CMA; 07/17/2018 2:50 PM) Zocor *ANTIHYPERLIPIDEMICS*  Medication History Sharyn Lull R. Brooks, CMA; 07/17/2018 2:50 PM) hydroCHLOROthiazide (12.5MG  Capsule, Oral) Active. Folic Acid (1MG  Tablet, Oral) Active. Flonase (50MCG/DOSE Inhaler, Nasal) Active. Levothyroxine Sodium (112MCG Tablet, Oral) Active. Symbicort (160-4.5MCG/ACT Aerosol, Inhalation) Active. Singulair (10MG  Tablet, Oral) Active. Vitamin D (1000UNIT Tablet, Oral) Active. NIFEdipine ER Osmotic Release (60MG  Tablet ER 24HR, Oral) Active. Medications Reconciled  Social History Sharyn Lull  R. Brooks, CMA; 07/17/2018 2:50 PM) Caffeine use Carbonated beverages, Coffee. No alcohol use No drug use Tobacco use Never smoker.  Family History Sharyn Lull R. Brooks, CMA; 07/17/2018 2:50 PM) Alcohol Abuse Brother. Breast Cancer Family Members In General, Mother. Cancer Family Members In General. Diabetes Mellitus Brother, Family Members In General, Father, Mother. Heart Disease Brother, Family Members In General, Father, Mother. Hypertension Brother, Family Members In [place], Father, Mother. Kidney Disease Father. Prostate Cancer Father. Respiratory Condition Mother. Thyroid problems Mother.  Pregnancy / Birth History Sharyn Lull R. Brooks, CMA; 07/17/2018 2:50 PM) Age at menarche 52 years. Age of menopause 70-55 Gravida 0 Irregular periods Para 0  Other Problems Sharyn Lull R. Brooks, CMA; 07/17/2018 2:50 PM) Asthma Breast Cancer Gastroesophageal Reflux Disease Heart murmur High blood pressure Thyroid Disease    Vitals (Michelle R. Brooks CMA; 07/17/2018 2:50 PM) 07/17/2018 2:49 PM Weight: 217.25 lb Height: 66in Body Surface Area: 2.07 m Body Mass Index: 35.06 kg/m  Pulse: 109 (Regular)  BP: 136/84 (Sitting, Left Arm, Standard)        Physical Exam (Infant Doane A. Aila Terra MD; 07/17/2018 4:12 PM)  General Mental Status-Alert. General Appearance-Consistent with stated age. Hydration-Well hydrated. Voice-Normal.  Neurologic Neurologic evaluation reveals -alert and oriented x 3 with no impairment of recent or remote memory. Mental Status-Normal.    Assessment & Plan (Tillmon Kisling A. Alleigh Mollica MD; 07/17/2018 4:12 PM)  BREAST CANCER, LEFT (C50.912) Impression: Patient opted for left breast localized lumpectomy with sentinel mapping. Risk of lumpectomy include bleeding, infection, seroma, more surgery, use of seed/wire, wound care, cosmetic deformity and the need for other treatments, death , blood clots, death. Pt agrees to  proceed. Risk of sentinel lymph node mapping include bleeding, infection, lymphedema, shoulder pain. stiffness, dye allergy. cosmetic deformity , blood clots, death, need for more surgery. Pt agres to proceed.  Current Plans  You are being scheduled for surgery- Our schedulers will call you.  You should hear from our office's scheduling department within 5 working days about the location, date, and time of surgery. We try to make accommodations for patient's preferences in scheduling surgery, but sometimes the OR schedule or the surgeon's schedule prevents [place] from making those accommodations.  If you have not heard from our office [phone]) in 5 working days, call the office and ask for your surgeon's nurse.  If you have other questions about your diagnosis, plan, or surgery, call the office and ask for your surgeon's nurse.  Pt Education - [facility] Breast Cancer Information Given - [facility] "Breast Journey" Package We discussed the staging and pathophysiology of breast cancer. We discussed all of the different options for treatment for breast cancer including surgery, chemotherapy, radiation therapy, Herceptin, and antiestrogen therapy. We discussed a sentinel lymph node biopsy as she does not appear to having lymph node involvement right now. We discussed the performance of that with injection of radioactive tracer and blue dye. We discussed that she would have an incision underneath her axillary hairline. We discussed that there is a bout a 10-20% chance of having a positive node with a sentinel lymph node biopsy and we will await the permanent pathology to make any other first further decisions in terms of her treatment. One of these options might be to return to the operating room to perform an axillary lymph node dissection. We discussed about a 1-2% risk lifetime of chronic shoulder pain as well as lymphedema associated with a sentinel lymph node biopsy. We discussed the options for treatment  of the breast cancer which included lumpectomy versus a mastectomy. We discussed the performance of the lumpectomy with a wire placement. We discussed a 10-20% chance of a positive margin requiring reexcision in the operating room. We also discussed that she may need radiation therapy or antiestrogen therapy or both if she undergoes lumpectomy. We discussed the mastectomy and the postoperative care for that as well. We discussed that there is no difference in her survival whether she undergoes lumpectomy with radiation therapy or antiestrogen therapy versus a mastectomy. There is a slight difference in the local recurrence rate being 3-5% with lumpectomy and about 1% with a mastectomy. We discussed the risks of operation including bleeding, infection, possible reoperation. She understands her further therapy will be based on what her stages at the time of her operation.  Pt Education - [facility]b breast cancer surgery: discussed with patient and provided information. Pt Education - ABC (After Breast Cancer) Class Info: discussed with patient and provided information.

## 2018-07-18 ENCOUNTER — Other Ambulatory Visit: Payer: Self-pay | Admitting: *Deleted

## 2018-07-18 ENCOUNTER — Encounter: Payer: Self-pay | Admitting: *Deleted

## 2018-07-18 ENCOUNTER — Telehealth: Payer: Self-pay | Admitting: Oncology

## 2018-07-18 ENCOUNTER — Other Ambulatory Visit: Payer: Self-pay | Admitting: Surgery

## 2018-07-18 DIAGNOSIS — J4540 Moderate persistent asthma, uncomplicated: Secondary | ICD-10-CM

## 2018-07-18 DIAGNOSIS — C50912 Malignant neoplasm of unspecified site of left female breast: Secondary | ICD-10-CM

## 2018-07-18 MED ORDER — MONTELUKAST SODIUM 10 MG PO TABS: 10.0000 mg | ORAL_TABLET | Freq: Every day | ORAL | 2 refills | Status: DC

## 2018-07-18 NOTE — Telephone Encounter (Signed)
R/s appt per 6/30 sch message - pt is aware of new appt date and time

## 2018-07-19 ENCOUNTER — Encounter: Payer: Self-pay | Admitting: Licensed Clinical Social Worker

## 2018-07-19 ENCOUNTER — Telehealth: Payer: Self-pay | Admitting: Licensed Clinical Social Worker

## 2018-07-19 ENCOUNTER — Ambulatory Visit: Payer: Self-pay | Admitting: Licensed Clinical Social Worker

## 2018-07-19 DIAGNOSIS — Z1379 Encounter for other screening for genetic and chromosomal anomalies: Secondary | ICD-10-CM | POA: Insufficient documentation

## 2018-07-19 DIAGNOSIS — Z801 Family history of malignant neoplasm of trachea, bronchus and lung: Secondary | ICD-10-CM

## 2018-07-19 DIAGNOSIS — Z170 Estrogen receptor positive status [ER+]: Secondary | ICD-10-CM

## 2018-07-19 DIAGNOSIS — C50412 Malignant neoplasm of upper-outer quadrant of left female breast: Secondary | ICD-10-CM

## 2018-07-19 DIAGNOSIS — Z803 Family history of malignant neoplasm of breast: Secondary | ICD-10-CM

## 2018-07-19 DIAGNOSIS — Z8042 Family history of malignant neoplasm of prostate: Secondary | ICD-10-CM

## 2018-07-19 NOTE — Progress Notes (Signed)
HPI:  [name] was previously seen in the [place] clinic due to a personal and family history of cancer and concerns regarding a hereditary predisposition to cancer. Please refer to our prior cancer genetics clinic note for more information regarding our discussion, assessment and recommendations, at the time. [name] recent genetic test results were disclosed to her, as were recommendations warranted by these results. These results and recommendations are discussed in more detail below.  CANCER HISTORY:  Oncology History   No history exists.    FAMILY HISTORY:  We obtained a detailed, 4-generation family history.  Significant diagnoses are listed below: Family History  Problem Relation Age of Onset  . Breast cancer Maternal Grandmother   . Heart disease Father   . Diabetes Father   . Prostate cancer Father 80  . Breast cancer Mother 68  . Hypertension Other   . Lung cancer Maternal Aunt   . Allergic rhinitis Neg Hx   . Angioedema Neg Hx   . Asthma Neg Hx   . Eczema Neg Hx   . Immunodeficiency Neg Hx   . Urticaria Neg Hx     [name] does not have children. She has one brother, 53, no history of cancer. She has one niece, 16, no history of cancer.  [name] mother was diagnosed with breast cancer at 103 and died at 93. The patient had 5 maternal uncles, 3 maternal aunts. One of her aunts had lung cancer and died at 50, history of smoking. No known cancers in her maternal cousins. Her maternal grandmother also had breast cancer and died at 14. She does not have info about her maternal grandfather.  [name]'s father was diagnosed with prostate cancer at 67 and died at 72. The patient has 4 paternal uncles, 4 paternal aunts. She believes one of her aunts had a GYN cancer, but she is unsure. No known cancers in paternal cousins. Her paternal grandmother died at 39, grandfather died at 68, no cancers.  [name] is unaware of previous  family history of genetic testing for hereditary cancer risks. There is no reported Ashkenazi Jewish ancestry. There is no known consanguinity.  GENETIC TEST RESULTS: Genetic testing reported out on 07/18/2018 through the [facility] Breast Cancer STAT Panel + Common Hereditary cancer panel found no pathogenic mutations.  The STAT Breast cancer panel offered by [facility] includes sequencing and rearrangement analysis for the following 9 genes:  ATM, BRCA1, BRCA2, CDH1, CHEK2, PALB2, PTEN, STK11 and TP53.    The Common Hereditary Cancers Panel offered by [facility] includes sequencing and/or deletion duplication testing of the following 48 genes: APC, ATM, AXIN2, BARD1, BMPR1A, BRCA1, BRCA2, BRIP1, CDH1, CDKN2A (p14ARF), CDKN2A (p16INK4a), CKD4, CHEK2, CTNNA1, DICER1, EPCAM (Deletion/duplication testing only), GREM1 (promoter region deletion/duplication testing only), KIT, MEN1, MLH1, MSH2, MSH3, MSH6, MUTYH, NBN, NF1, NHTL1, PALB2, PDGFRA, PMS2, POLD1, POLE, PTEN, RAD50, RAD51C, RAD51D, RNF43, SDHB, SDHC, SDHD, SMAD4, SMARCA4. STK11, TP53, TSC1, TSC2, and VHL.  The following genes were evaluated for sequence changes only: SDHA and HOXB13 c.251G>A variant only.  The test report has been scanned into EPIC and is located under the Molecular Pathology section of the Results Review tab.  A portion of the result report is included below for reference.     We discussed with [name] that because current genetic testing is not perfect, it is possible there may be a gene mutation in one of these genes that current testing cannot detect, but that chance is small.  We also discussed, that there could be another gene that has not yet been discovered, or that we have not yet tested, that is responsible for the cancer diagnoses in the family. It is also possible there is a hereditary cause for the cancer in the family that [name] did not inherit and therefore was not identified in her testing.  Therefore, it is  important to remain in touch with cancer genetics in the future so that we can continue to offer [name] the most up to date genetic testing.   ADDITIONAL GENETIC TESTING: We discussed with [name] that her genetic testing was fairly extensive.  If there are genes identified to increase cancer risk that can be analyzed in the future, we would be happy to discuss and coordinate this testing at that time.    CANCER SCREENING RECOMMENDATIONS: [name] test result is considered negative (normal).  This means that we have not identified a hereditary cause for her  personal and family history of cancer at this time. Most cancers happen by chance and this negative test suggests that her cancer may fall into this category.    While reassuring, this does not definitively rule out a hereditary predisposition to cancer. It is still possible that there could be genetic mutations that are undetectable by current technology. There could be genetic mutations in genes that have not been tested or identified to increase cancer risk.  Therefore, it is recommended she continue to follow the cancer management and screening guidelines provided by her oncology and primary healthcare provider.   An individual's cancer risk and medical management are not determined by genetic test results alone. Overall cancer risk assessment incorporates additional factors, including personal medical history, family history, and any available genetic information that may result in a personalized plan for cancer prevention and surveillance  RECOMMENDATIONS FOR FAMILY MEMBERS:  Relatives in this family might be at some increased risk of developing cancer, over the general population risk, simply due to the family history of cancer.  We recommended female relatives in this family have a yearly mammogram beginning at age 26, or 75 years younger than the earliest onset of cancer, an annual clinical breast exam, and perform monthly  breast self-exams. Female relatives in this family should also have a gynecological exam as recommended by their primary provider. All family members should have a colonoscopy by age 73, or as directed by their physicians.  FOLLOW-UP: Lastly, we discussed with [name] that cancer genetics is a rapidly advancing field and it is possible that new genetic tests will be appropriate for her and/or her family members in the future. We encouraged her to remain in contact with cancer genetics on an annual basis so we can update her personal and family histories and let her know of advances in cancer genetics that may benefit this family.   Our contact number was provided. [name] questions were answered to her satisfaction, and she knows she is welcome to call us at anytime with additional questions or concerns.   Faith Rogue, MS Genetic Counselor [email or web address] Phone: [phone]

## 2018-07-19 NOTE — Telephone Encounter (Signed)
Revealed negative genetic testing.   This normal result is reassuring and indicates that it is unlikely Veronica Reilly's cancer is due to a hereditary cause.  It is unlikely that there is an increased risk of another cancer due to a mutation in one of these genes.  However, genetic testing is not perfect, and cannot definitively rule out a hereditary cause.  It will be important for her to keep in contact with genetics to learn if any additional testing may be needed in the future.    ? ?

## 2018-07-25 ENCOUNTER — Encounter: Payer: Self-pay | Admitting: *Deleted

## 2018-08-15 ENCOUNTER — Other Ambulatory Visit: Payer: Self-pay

## 2018-08-15 ENCOUNTER — Encounter (HOSPITAL_BASED_OUTPATIENT_CLINIC_OR_DEPARTMENT_OTHER): Payer: Self-pay | Admitting: *Deleted

## 2018-08-18 ENCOUNTER — Ambulatory Visit: Payer: BC Managed Care – PPO | Admitting: Oncology

## 2018-08-18 ENCOUNTER — Other Ambulatory Visit (HOSPITAL_COMMUNITY)
Admission: RE | Admit: 2018-08-18 | Discharge: 2018-08-18 | Disposition: A | Discharge status: Home / Self Care | Payer: BC Managed Care – PPO | Source: Ambulatory Visit | Attending: Surgery | Admitting: Surgery

## 2018-08-18 ENCOUNTER — Encounter (HOSPITAL_BASED_OUTPATIENT_CLINIC_OR_DEPARTMENT_OTHER)
Admission: RE | Admit: 2018-08-18 | Discharge: 2018-08-18 | Disposition: A | Discharge status: Home / Self Care | Payer: BC Managed Care – PPO | Source: Ambulatory Visit | Attending: Surgery | Admitting: Surgery

## 2018-08-18 ENCOUNTER — Other Ambulatory Visit: Payer: Self-pay

## 2018-08-18 DIAGNOSIS — Z20828 Contact with and (suspected) exposure to other viral communicable diseases: Secondary | ICD-10-CM | POA: Diagnosis not present

## 2018-08-18 DIAGNOSIS — C50912 Malignant neoplasm of unspecified site of left female breast: Secondary | ICD-10-CM

## 2018-08-18 LAB — CBC WITH DIFFERENTIAL/PLATELET
Abs Immature Granulocytes: 0.02 10*3/uL (ref 0.00–0.07)
Basophils Absolute: 0.1 10*3/uL (ref 0.0–0.1)
Basophils Relative: 1 %
Eosinophils Absolute: 0.2 10*3/uL (ref 0.0–0.5)
Eosinophils Relative: 2 %
HCT: 40.9 % (ref 36.0–46.0)
Hemoglobin: 13.4 g/dL (ref 12.0–15.0)
Immature Granulocytes: 0 %
Lymphocytes Relative: 30 %
Lymphs Abs: 2.4 10*3/uL (ref 0.7–4.0)
MCH: 26.4 pg (ref 26.0–34.0)
MCHC: 32.8 g/dL (ref 30.0–36.0)
MCV: 80.5 fL (ref 80.0–100.0)
Monocytes Absolute: 1 10*3/uL (ref 0.1–1.0)
Monocytes Relative: 12 %
Neutro Abs: 4.3 10*3/uL (ref 1.7–7.7)
Neutrophils Relative %: 55 %
Platelets: 267 10*3/uL (ref 150–400)
RBC: 5.08 MIL/uL (ref 3.87–5.11)
RDW: 13.5 % (ref 11.5–15.5)
WBC: 7.9 10*3/uL (ref 4.0–10.5)
nRBC: 0 % (ref 0.0–0.2)

## 2018-08-18 LAB — COMPREHENSIVE METABOLIC PANEL
ALT: 15 U/L (ref 0–44)
AST: 15 U/L (ref 15–41)
Albumin: 3.9 g/dL (ref 3.5–5.0)
Alkaline Phosphatase: 64 U/L (ref 38–126)
Anion gap: 10 (ref 5–15)
BUN: 13 mg/dL (ref 6–20)
CO2: 25 mmol/L (ref 22–32)
Calcium: 9.3 mg/dL (ref 8.9–10.3)
Chloride: 104 mmol/L (ref 98–111)
Creatinine, Ser: 0.86 mg/dL (ref 0.44–1.00)
GFR calc Af Amer: >60 mL/min (ref >60)
GFR calc non Af Amer: >60 mL/min (ref >60)
Glucose, Bld: 85 mg/dL (ref 70–99)
Potassium: 4.1 mmol/L (ref 3.5–5.1)
Sodium: 139 mmol/L (ref 135–145)
Total Bilirubin: 0.8 mg/dL (ref 0.3–1.2)
Total Protein: 7.5 g/dL (ref 6.5–8.1)

## 2018-08-18 LAB — SARS CORONAVIRUS 2 (TAT 6-24 HRS): SARS Coronavirus 2: NEGATIVE

## 2018-08-18 NOTE — Progress Notes (Addendum)
Ensure pre surgery drink given with instructions to complete by 0800 dos, surgical soap given with instructions, pt verbalized understanding.  EKG reviewed by Dr. Conrad [place], will proceed with surgery as scheduled.

## 2018-08-21 ENCOUNTER — Other Ambulatory Visit: Payer: Self-pay

## 2018-08-21 ENCOUNTER — Ambulatory Visit
Admission: RE | Admit: 2018-08-21 | Discharge: 2018-08-21 | Disposition: A | Discharge status: Home / Self Care | Payer: BC Managed Care – PPO | Source: Ambulatory Visit | Attending: Surgery | Admitting: Surgery

## 2018-08-21 DIAGNOSIS — C50912 Malignant neoplasm of unspecified site of left female breast: Secondary | ICD-10-CM

## 2018-08-22 ENCOUNTER — Encounter (HOSPITAL_BASED_OUTPATIENT_CLINIC_OR_DEPARTMENT_OTHER)
Admission: RE | Disposition: A | Discharge status: Home / Self Care | Payer: Self-pay | Source: Home / Self Care | Attending: Surgery

## 2018-08-22 ENCOUNTER — Ambulatory Visit (HOSPITAL_BASED_OUTPATIENT_CLINIC_OR_DEPARTMENT_OTHER): Payer: BC Managed Care – PPO | Admitting: Anesthesiology

## 2018-08-22 ENCOUNTER — Other Ambulatory Visit: Payer: Self-pay

## 2018-08-22 ENCOUNTER — Ambulatory Visit
Admission: RE | Admit: 2018-08-22 | Discharge: 2018-08-22 | Disposition: A | Discharge status: Home / Self Care | Payer: BC Managed Care – PPO | Source: Ambulatory Visit | Attending: Surgery | Admitting: Surgery

## 2018-08-22 ENCOUNTER — Encounter (HOSPITAL_BASED_OUTPATIENT_CLINIC_OR_DEPARTMENT_OTHER): Payer: Self-pay

## 2018-08-22 ENCOUNTER — Ambulatory Visit (HOSPITAL_BASED_OUTPATIENT_CLINIC_OR_DEPARTMENT_OTHER)
Admission: RE | Admit: 2018-08-22 | Discharge: 2018-08-22 | Disposition: A | Discharge status: Home / Self Care | Payer: BC Managed Care – PPO | Attending: Surgery | Admitting: Surgery

## 2018-08-22 ENCOUNTER — Ambulatory Visit (HOSPITAL_COMMUNITY)
Admission: RE | Admit: 2018-08-22 | Discharge: 2018-08-22 | Disposition: A | Discharge status: Home / Self Care | Payer: BC Managed Care – PPO | Source: Ambulatory Visit | Attending: Surgery | Admitting: Surgery

## 2018-08-22 DIAGNOSIS — N6489 Other specified disorders of breast: Secondary | ICD-10-CM | POA: Insufficient documentation

## 2018-08-22 DIAGNOSIS — Z79890 Hormone replacement therapy (postmenopausal): Secondary | ICD-10-CM | POA: Diagnosis not present

## 2018-08-22 DIAGNOSIS — I10 Essential (primary) hypertension: Secondary | ICD-10-CM | POA: Insufficient documentation

## 2018-08-22 DIAGNOSIS — Z888 Allergy status to other drugs, medicaments and biological substances status: Secondary | ICD-10-CM | POA: Insufficient documentation

## 2018-08-22 DIAGNOSIS — C50912 Malignant neoplasm of unspecified site of left female breast: Secondary | ICD-10-CM

## 2018-08-22 DIAGNOSIS — Z7951 Long term (current) use of inhaled steroids: Secondary | ICD-10-CM | POA: Diagnosis not present

## 2018-08-22 DIAGNOSIS — E079 Disorder of thyroid, unspecified: Secondary | ICD-10-CM | POA: Insufficient documentation

## 2018-08-22 DIAGNOSIS — J45909 Unspecified asthma, uncomplicated: Secondary | ICD-10-CM | POA: Insufficient documentation

## 2018-08-22 DIAGNOSIS — Z79899 Other long term (current) drug therapy: Secondary | ICD-10-CM | POA: Diagnosis not present

## 2018-08-22 HISTORY — PX: BREAST LUMPECTOMY: SHX2

## 2018-08-22 HISTORY — PX: BREAST LUMPECTOMY WITH RADIOACTIVE SEED AND SENTINEL LYMPH NODE BIOPSY: SHX6550

## 2018-08-22 HISTORY — DX: Gastro-esophageal reflux disease without esophagitis: K21.9

## 2018-08-22 SURGERY — BREAST LUMPECTOMY WITH RADIOACTIVE SEED AND SENTINEL LYMPH NODE BIOPSY
Anesthesia: General | Site: Breast | Laterality: Left

## 2018-08-22 MED ORDER — SUCCINYLCHOLINE CHLORIDE 200 MG/10ML IV SOSY
PREFILLED_SYRINGE | INTRAVENOUS | Status: AC
  Filled 2018-08-22: qty 10

## 2018-08-22 MED ORDER — GABAPENTIN 300 MG PO CAPS
300.0000 mg | ORAL_CAPSULE | ORAL | Status: AC
  Administered 2018-08-22: 300 mg via ORAL

## 2018-08-22 MED ORDER — PROPOFOL 500 MG/50ML IV EMUL
INTRAVENOUS | Status: AC
  Filled 2018-08-22: qty 50

## 2018-08-22 MED ORDER — TECHNETIUM TC 99M SULFUR COLLOID FILTERED
1.0000 | Freq: Once | INTRAVENOUS | Status: AC | PRN
  Administered 2018-08-22: 1 via INTRADERMAL

## 2018-08-22 MED ORDER — CHLORHEXIDINE GLUCONATE CLOTH 2 % EX PADS: 6.0000 | MEDICATED_PAD | Freq: Once | CUTANEOUS | Status: DC

## 2018-08-22 MED ORDER — PHENYLEPHRINE 40 MCG/ML (10ML) SYRINGE FOR IV PUSH (FOR BLOOD PRESSURE SUPPORT)
PREFILLED_SYRINGE | INTRAVENOUS | Status: AC
  Filled 2018-08-22: qty 10

## 2018-08-22 MED ORDER — HYDROCODONE-ACETAMINOPHEN 5-325 MG PO TABS: 1.0000 | ORAL_TABLET | Freq: Four times a day (QID) | ORAL | 0 refills | Status: DC | PRN

## 2018-08-22 MED ORDER — ACETAMINOPHEN 500 MG PO TABS
ORAL_TABLET | ORAL | Status: AC
  Filled 2018-08-22: qty 2

## 2018-08-22 MED ORDER — GABAPENTIN 300 MG PO CAPS
ORAL_CAPSULE | ORAL | Status: AC
  Filled 2018-08-22: qty 1

## 2018-08-22 MED ORDER — DEXAMETHASONE SODIUM PHOSPHATE 10 MG/ML IJ SOLN
INTRAMUSCULAR | Status: AC
  Filled 2018-08-22: qty 1

## 2018-08-22 MED ORDER — ACETAMINOPHEN 500 MG PO TABS
1000.0000 mg | ORAL_TABLET | ORAL | Status: AC
  Administered 2018-08-22: 1000 mg via ORAL

## 2018-08-22 MED ORDER — DEXAMETHASONE SODIUM PHOSPHATE 4 MG/ML IJ SOLN
INTRAMUSCULAR | Status: DC | PRN
  Administered 2018-08-22: 10 mg via INTRAVENOUS

## 2018-08-22 MED ORDER — IBUPROFEN 800 MG PO TABS: 800.0000 mg | ORAL_TABLET | Freq: Three times a day (TID) | ORAL | 0 refills | Status: DC | PRN

## 2018-08-22 MED ORDER — CEFAZOLIN SODIUM-DEXTROSE 2-4 GM/100ML-% IV SOLN
2.0000 g | INTRAVENOUS | Status: AC
  Administered 2018-08-22: 2 g via INTRAVENOUS

## 2018-08-22 MED ORDER — LIDOCAINE 2% (20 MG/ML) 5 ML SYRINGE
INTRAMUSCULAR | Status: AC
  Filled 2018-08-22: qty 5

## 2018-08-22 MED ORDER — ONDANSETRON HCL 4 MG/2ML IJ SOLN
INTRAMUSCULAR | Status: AC
  Filled 2018-08-22: qty 2

## 2018-08-22 MED ORDER — MIDAZOLAM HCL 2 MG/2ML IJ SOLN
1.0000 mg | INTRAMUSCULAR | Status: DC | PRN
  Administered 2018-08-22 (×2): 2 mg via INTRAVENOUS

## 2018-08-22 MED ORDER — SCOPOLAMINE 1 MG/3DAYS TD PT72: 1.0000 | MEDICATED_PATCH | Freq: Once | TRANSDERMAL | Status: DC

## 2018-08-22 MED ORDER — LIDOCAINE HCL (CARDIAC) PF 100 MG/5ML IV SOSY
PREFILLED_SYRINGE | INTRAVENOUS | Status: DC | PRN
  Administered 2018-08-22: 100 mg via INTRAVENOUS

## 2018-08-22 MED ORDER — BUPIVACAINE-EPINEPHRINE (PF) 0.25% -1:200000 IJ SOLN
INTRAMUSCULAR | Status: DC | PRN
  Administered 2018-08-22: 10 mL

## 2018-08-22 MED ORDER — ONDANSETRON HCL 4 MG/2ML IJ SOLN
INTRAMUSCULAR | Status: DC | PRN
  Administered 2018-08-22: 4 mg via INTRAVENOUS

## 2018-08-22 MED ORDER — MIDAZOLAM HCL 2 MG/2ML IJ SOLN
INTRAMUSCULAR | Status: AC
  Filled 2018-08-22: qty 2

## 2018-08-22 MED ORDER — FENTANYL CITRATE (PF) 100 MCG/2ML IJ SOLN
50.0000 ug | INTRAMUSCULAR | Status: DC | PRN
  Administered 2018-08-22: 50 ug via INTRAVENOUS
  Administered 2018-08-22: 100 ug via INTRAVENOUS

## 2018-08-22 MED ORDER — SODIUM CHLORIDE (PF) 0.9 % IJ SOLN
INTRAMUSCULAR | Status: AC
  Filled 2018-08-22: qty 10

## 2018-08-22 MED ORDER — FENTANYL CITRATE (PF) 100 MCG/2ML IJ SOLN
INTRAMUSCULAR | Status: AC
  Filled 2018-08-22: qty 2

## 2018-08-22 MED ORDER — CEFAZOLIN SODIUM-DEXTROSE 2-4 GM/100ML-% IV SOLN
INTRAVENOUS | Status: AC
  Filled 2018-08-22: qty 100

## 2018-08-22 MED ORDER — LACTATED RINGERS IV SOLN
INTRAVENOUS | Status: DC
  Administered 2018-08-22 (×2): via INTRAVENOUS

## 2018-08-22 MED ORDER — EPHEDRINE 5 MG/ML INJ
INTRAVENOUS | Status: AC
  Filled 2018-08-22: qty 10

## 2018-08-22 MED ORDER — PROPOFOL 10 MG/ML IV BOLUS
INTRAVENOUS | Status: DC | PRN
  Administered 2018-08-22: 150 mg via INTRAVENOUS

## 2018-08-22 MED ORDER — METHYLENE BLUE 0.5 % INJ SOLN
INTRAVENOUS | Status: AC
  Filled 2018-08-22: qty 10

## 2018-08-22 SURGICAL SUPPLY — 54 items
ADH SKN CLS APL DERMABOND .7 (GAUZE/BANDAGES/DRESSINGS) ×1
APL PRP STRL LF DISP 70% ISPRP (MISCELLANEOUS) ×1
APPLIER CLIP 9.375 MED OPEN (MISCELLANEOUS) ×3
APR CLP MED 9.3 20 MLT OPN (MISCELLANEOUS) ×1
BINDER BREAST LRG (GAUZE/BANDAGES/DRESSINGS) IMPLANT
BINDER BREAST MEDIUM (GAUZE/BANDAGES/DRESSINGS) IMPLANT
BINDER BREAST XLRG (GAUZE/BANDAGES/DRESSINGS) IMPLANT
BINDER BREAST XXLRG (GAUZE/BANDAGES/DRESSINGS) ×2 IMPLANT
BLADE SURG 15 STRL LF DISP TIS (BLADE) ×1 IMPLANT
BLADE SURG 15 STRL SS (BLADE) ×3
CANISTER SUC SOCK COL 7IN (MISCELLANEOUS) IMPLANT
CANISTER SUCT 1200ML W/VALVE (MISCELLANEOUS) ×3 IMPLANT
CHLORAPREP W/TINT 26 (MISCELLANEOUS) ×3 IMPLANT
CLIP APPLIE 9.375 MED OPEN (MISCELLANEOUS) ×1 IMPLANT
COVER BACK TABLE REUSABLE LG (DRAPES) ×3 IMPLANT
COVER MAYO STAND REUSABLE (DRAPES) ×3 IMPLANT
COVER PROBE W GEL 5X96 (DRAPES) ×3 IMPLANT
COVER WAND RF STERILE (DRAPES) IMPLANT
DECANTER SPIKE VIAL GLASS SM (MISCELLANEOUS) IMPLANT
DERMABOND ADVANCED (GAUZE/BANDAGES/DRESSINGS) ×2
DERMABOND ADVANCED .7 DNX12 (GAUZE/BANDAGES/DRESSINGS) ×1 IMPLANT
DRAPE LAPAROSCOPIC ABDOMINAL (DRAPES) ×3 IMPLANT
DRAPE UTILITY XL STRL (DRAPES) ×3 IMPLANT
ELECT COATED BLADE 2.86 ST (ELECTRODE) ×3 IMPLANT
ELECT REM PT RETURN 9FT ADLT (ELECTROSURGICAL) ×3
ELECTRODE REM PT RTRN 9FT ADLT (ELECTROSURGICAL) ×1 IMPLANT
GLOVE BIOGEL PI IND STRL 8 (GLOVE) ×1 IMPLANT
GLOVE BIOGEL PI INDICATOR 8 (GLOVE) ×2
GLOVE ECLIPSE 8.0 STRL XLNG CF (GLOVE) ×1 IMPLANT
GLOVE EXAM NITRILE MD LF STRL (GLOVE) ×2 IMPLANT
GLOVE SURG SS PI 6.5 STRL IVOR (GLOVE) ×2 IMPLANT
GLOVE SURG SS PI 8.0 STRL IVOR (GLOVE) ×2 IMPLANT
GOWN STRL REUS W/ TWL LRG LVL3 (GOWN DISPOSABLE) ×2 IMPLANT
GOWN STRL REUS W/TWL LRG LVL3 (GOWN DISPOSABLE) ×6
HEMOSTAT ARISTA ABSORB 3G PWDR (HEMOSTASIS) ×2 IMPLANT
HEMOSTAT SNOW SURGICEL 2X4 (HEMOSTASIS) IMPLANT
KIT MARKER MARGIN INK (KITS) ×3 IMPLANT
NDL HYPO 25X1 1.5 SAFETY (NEEDLE) ×1 IMPLANT
NDL SAFETY ECLIPSE 18X1.5 (NEEDLE) IMPLANT
NEEDLE HYPO 18GX1.5 SHARP (NEEDLE)
NEEDLE HYPO 25X1 1.5 SAFETY (NEEDLE) ×3 IMPLANT
NS IRRIG 1000ML POUR BTL (IV SOLUTION) ×3 IMPLANT
PACK BASIN DAY SURGERY FS (CUSTOM PROCEDURE TRAY) ×3 IMPLANT
PENCIL BUTTON HOLSTER BLD 10FT (ELECTRODE) ×3 IMPLANT
SLEEVE SCD COMPRESS KNEE MED (MISCELLANEOUS) ×3 IMPLANT
SPONGE LAP 4X18 RFD (DISPOSABLE) ×3 IMPLANT
SUT MNCRL AB 4-0 PS2 18 (SUTURE) ×3 IMPLANT
SUT VICRYL 3-0 CR8 SH (SUTURE) ×3 IMPLANT
SYR CONTROL 10ML LL (SYRINGE) ×3 IMPLANT
TOWEL GREEN STERILE FF (TOWEL DISPOSABLE) ×3 IMPLANT
TRAY FAXITRON CT DISP (TRAY / TRAY PROCEDURE) ×3 IMPLANT
TUBE CONNECTING 20'X1/4 (TUBING) ×1
TUBE CONNECTING 20X1/4 (TUBING) ×2 IMPLANT
YANKAUER SUCT BULB TIP NO VENT (SUCTIONS) ×3 IMPLANT

## 2018-08-22 NOTE — Op Note (Signed)
Preoperative diagnosis: Stage II left breast cancer  Postoperative diagnosis: Same  Procedure: Left breast seed localized lumpectomy left axillary deep sentinel lymph node mapping  Surgeon: Erroll Luna, MD  Anesthesia: LMA with local and pectoral block  EBL 30 cc  Specimen: Left breast tissue with localizing seed.  This was placed separate from the previous placed biopsy clip due to migration.  The seed was placed into the microcalcifications.  The seed was removed separate from the tissue and sent separately.  All margins were excised and 5 sentinel nodes identified  Drains: None  Indications for procedure: The patient is a 53 year old female being followed by mammography for a subareolar left breast mass.  Core biopsy showed fibrocystic changes but she had an area of calcifications in her left breast upper outer quadrant that were followed up.  These were changed and core biopsy was done which showed an area of roughly 3.1 cm.  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  There is minimal invasive disease present though but she did have evidence of invasion.  She opted for breast conservation after discussion of different operative procedures, outcomes, long-term expectations and other treatments.The procedure has been discussed with the patient. Alternatives to surgery have been discussed with the patient.  Risks of surgery include bleeding,  Infection,  Seroma formation, death,  and the need for further surgery.   The patient understands and wishes to proceed.Sentinel lymph node mapping and dissection has been discussed with the patient.  Risk of bleeding,  Infection,  Seroma formation,  Additional procedures,,  Shoulder weakness ,  Shoulder stiffness,  Nerve and blood vessel injury and reaction to the mapping dyes have been discussed.  Alternatives to surgery have been discussed with the patient.  The patient agrees to  proceed.   Description of procedure: The patient was met in the holding area.  Questions were answered and the neoprobe was used to localize the seed in her left breast upper outer quadrant.  She underwent a pectoral block per anesthesia and nuclear medicine injection for mapping.  She was then taken back the operating room.  She is placed supine upon the operating table.  After induction of general esthesia the left breast was prepped and draped in sterile fashion and timeout was done.  Proper site and patient were identified.  The neoprobe was used and the hotspot was identified left breast upper outer quadrant which was quite deep.  The films were available which showed the seed and the clip to be separated by at least 2 cm and the clip did appear more medially.  The calcifications were targeted by the seed therefore we focused our attention there.  Incision was made in the left axilla.  Dissection was carried down used the neoprobe and all tissue around the seed was excised.  Of note the seed was in the superficial port of the specimen and came out so I submitted separately.  Faxitron image taken.  Some faint calcifications could be seen.  I opted to excise the entire cavity and all margins were excised and reimaged.  I did not identify the clip but it is quite possible this was dislodged and pulled out with a sponge.  It was not at my target anyway therefore I did not do an exhaustive surgical went ahead and excised the area widely to ensure negative margins.  The neoprobe was then set to technetium.  Identified 5 hot sentinel nodes the  same incision.  These were removed and sent to pathology.  Background counts approached baseline.  Hemostasis was achieved.  The wound was irrigated and closed after placement of Arista with 3-0 Vicryl and 4-0 Monocryl.  Dermabond applied.  All final counts found to be correct.  Breast binder placed.  The patient awoke, extubated taken to recovery in satisfactory condition.

## 2018-08-22 NOTE — Discharge Instructions (Signed)
No TYLENOL BEFORE 4:30 PM today!      [place] Office Phone Number [phone]  BREAST BIOPSY/ PARTIAL MASTECTOMY: POST OP INSTRUCTIONS  Always review your discharge instruction sheet given to you by the facility where your surgery was performed.  IF YOU HAVE DISABILITY OR FAMILY LEAVE FORMS, YOU MUST BRING THEM TO THE OFFICE FOR PROCESSING.  DO NOT GIVE THEM TO YOUR DOCTOR.  1. A prescription for pain medication may be given to you upon discharge.  Take your pain medication as prescribed, if needed.  If narcotic pain medicine is not needed, then you may take acetaminophen (Tylenol) or ibuprofen (Advil) as needed. 2. Take your usually prescribed medications unless otherwise directed 3. If you need a refill on your pain medication, please contact your pharmacy.  They will contact our office to request authorization.  Prescriptions will not be filled after 5pm or on week-ends. 4. You should eat very light the first 24 hours after surgery, such as soup, crackers, pudding, etc.  Resume your normal diet the day after surgery. 5. Most patients will experience some swelling and bruising in the breast.  Ice packs and a good support bra will help.  Swelling and bruising can take several days to resolve.  6. It is common to experience some constipation if taking pain medication after surgery.  Increasing fluid intake and taking a stool softener will usually help or prevent this problem from occurring.  A mild laxative (Milk of Magnesia or Miralax) should be taken according to package directions if there are no bowel movements after 48 hours. 7. Unless discharge instructions indicate otherwise, you may remove your bandages 24-48 hours after surgery, and you may shower at that time.  You may have steri-strips (small skin tapes) in place directly over the incision.  These strips should be left on the skin for 7-10 days.  If your surgeon used skin glue on the incision, you may shower in 24  hours.  The glue will flake off over the next 2-3 weeks.  Any sutures or staples will be removed at the office during your follow-up visit. 8. ACTIVITIES:  You may resume regular daily activities (gradually increasing) beginning the next day.  Wearing a good support bra or sports bra minimizes pain and swelling.  You may have sexual intercourse when it is comfortable. a. You may drive when you no longer are taking prescription pain medication, you can comfortably wear a seatbelt, and you can safely maneuver your car and apply brakes. b. RETURN TO WORK:  ______________________________________________________________________________________ 9. You should see your doctor in the office for a follow-up appointment approximately two weeks after your surgery.  Your doctors nurse will typically make your follow-up appointment when she calls you with your pathology report.  Expect your pathology report 2-3 business days after your surgery.  You may call to check if you do not hear from [place] after three days. 10. OTHER INSTRUCTIONS: _______________________________________________________________________________________________ _____________________________________________________________________________________________________________________________________ _____________________________________________________________________________________________________________________________________ _____________________________________________________________________________________________________________________________________  WHEN TO CALL YOUR DOCTOR: 1. Fever over 101.0 2. Nausea and/or vomiting. 3. Extreme swelling or bruising. 4. Continued bleeding from incision. 5. Increased pain, redness, or drainage from the incision.  The clinic staff is available to answer your questions during regular business hours.  Please dont hesitate to call and ask to speak to one of the nurses for clinical concerns.  If you have a  medical emergency, go to the nearest emergency room or call 911.  A surgeon from [facility] is always on call  at the hospital.  For further questions, please visit [email or web address]        Post Anesthesia Home Care Instructions  Activity: Get plenty of rest for the remainder of the day. A responsible individual must stay with you for 24 hours following the procedure.  For the next 24 hours, DO NOT: -Drive a car -Paediatric nurse -Drink alcoholic beverages -Take any medication unless instructed by your physician -Make any legal decisions or sign important papers.  Meals: Start with liquid foods such as gelatin or soup. Progress to regular foods as tolerated. Avoid greasy, spicy, heavy foods. If nausea and/or vomiting occur, drink only clear liquids until the nausea and/or vomiting subsides. Call your physician if vomiting continues.  Special Instructions/Symptoms: Your throat may feel dry or sore from the anesthesia or the breathing tube placed in your throat during surgery. If this causes discomfort, gargle with warm salt water. The discomfort should disappear within 24 hours.  If you had a scopolamine patch placed behind your ear for the management of post- operative nausea and/or vomiting:  1. The medication in the patch is effective for 72 hours, after which it should be removed.  Wrap patch in a tissue and discard in the trash. Wash hands thoroughly with soap and water. 2. You may remove the patch earlier than 72 hours if you experience unpleasant side effects which may include dry mouth, dizziness or visual disturbances. 3. Avoid touching the patch. Wash your hands with soap and water after contact with the patch.

## 2018-08-22 NOTE — Interval H&P Note (Signed)
History and Physical Interval Note:  08/22/2018 11:50 AM  Veronica Reilly  has presented today for surgery, with the diagnosis of LEFT BREAST CANCER.  The various methods of treatment have been discussed with the patient and family. After consideration of risks, benefits and other options for treatment, the patient has consented to  Procedure(s): LEFT BREAST LUMPECTOMY WITH RADIOACTIVE SEED AND SENTINEL Intercourse (Left) as a surgical intervention.  The patient's history has been reviewed, patient examined, no change in status, stable for surgery.  I have reviewed the patient's chart and labs.  Questions were answered to the patient's satisfaction.     [place]

## 2018-08-22 NOTE — Progress Notes (Signed)
Assisted Dr. Ossey with left, ultrasound guided, pectoralis block. Side rails up, monitors on throughout procedure. See vital signs in flow sheet. Tolerated Procedure well. 

## 2018-08-22 NOTE — Progress Notes (Signed)
Nuc med injection performed by nuc med staff.  VS stable.  Pt tolerated well, no additional sedation needed.

## 2018-08-22 NOTE — H&P (Signed)
[name] Documented: 07/17/2018 2:49 PM Location: [facility] Patient #: [id number] DOB: 1965/07/11 Married / Language: English / Race: Black or African American Female  History of Present Illness Marcello Moores A. Kerianna Rawlinson MD; 07/17/2018 4:11 PM) Patient words: Patient returns after meeting with medical and radiation oncology about her stage II left breast cancer. She is undergoing genetic testing and his results should be back soon. She had questions about her surgery needs today to discuss this. I reviewed the surgical options available to her for breast conservation to mastectomy and reconstruction and the pros and cons of each as well as long-term survival of each operation with treatment and recurrence rates with each. She is opted to continue with left breast lumpectomy with sentinel lymph node mapping. Oncology plans to do further testing of the tumor after surgery.  The patient is a 53 year old female.   Problem List/Past Medical Sharyn Lull R. Brooks, CMA; 07/17/2018 2:50 PM) BREAST CANCER, LEFT (G29.528)  Past Surgical History Sharyn Lull R. Brooks, CMA; 07/17/2018 2:50 PM) Breast Biopsy Left. multiple Foot Surgery Left. Oral Surgery  Diagnostic Studies History Sharyn Lull R. Rolena Infante, CMA; 07/17/2018 2:50 PM) Colonoscopy never Mammogram within last year Pap Smear 1-5 years ago  Allergies Sharyn Lull R. Brooks, CMA; 07/17/2018 2:50 PM) Zocor *ANTIHYPERLIPIDEMICS*  Medication History Sharyn Lull R. Brooks, CMA; 07/17/2018 2:50 PM) hydroCHLOROthiazide (12.5MG  Capsule, Oral) Active. Folic Acid (1MG  Tablet, Oral) Active. Flonase (50MCG/DOSE Inhaler, Nasal) Active. Levothyroxine Sodium (112MCG Tablet, Oral) Active. Symbicort (160-4.5MCG/ACT Aerosol, Inhalation) Active. Singulair (10MG  Tablet, Oral) Active. Vitamin D (1000UNIT Tablet, Oral) Active. NIFEdipine ER Osmotic Release (60MG  Tablet ER 24HR, Oral) Active. Medications Reconciled  Social History  Sharyn Lull R. Brooks, CMA; 07/17/2018 2:50 PM) Caffeine use Carbonated beverages, Coffee. No alcohol use No drug use Tobacco use Never smoker.  Family History Sharyn Lull R. Brooks, CMA; 07/17/2018 2:50 PM) Alcohol Abuse Brother. Breast Cancer Family Members In General, Mother. Cancer Family Members In General. Diabetes Mellitus Brother, Family Members In General, Father, Mother. Heart Disease Brother, Family Members In General, Father, Mother. Hypertension Brother, Family Members In [place], Father, Mother. Kidney Disease Father. Prostate Cancer Father. Respiratory Condition Mother. Thyroid problems Mother.  Pregnancy / Birth History Sharyn Lull R. Brooks, CMA; 07/17/2018 2:50 PM) Age at menarche 77 years. Age of menopause 38-55 Gravida 0 Irregular periods Para 0  Other Problems Sharyn Lull R. Brooks, CMA; 07/17/2018 2:50 PM) Asthma Breast Cancer Gastroesophageal Reflux Disease Heart murmur High blood pressure Thyroid Disease    Vitals (Michelle R. Brooks CMA; 07/17/2018 2:50 PM) 07/17/2018 2:49 PM Weight: 217.25 lb Height: 66in Body Surface Area: 2.07 m Body Mass Index: 35.06 kg/m  Pulse: 109 (Regular)  BP: 136/84 (Sitting, Left Arm, Standard)        Physical Exam (Briseyda Fehr A. Zakhari Fogel MD; 07/17/2018 4:12 PM)  General Mental Status-Alert. General Appearance-Consistent with stated age. Hydration-Well hydrated. Voice-Normal.  Neurologic Neurologic evaluation reveals -alert and oriented x 3 with no impairment of recent or remote memory. Mental Status-Normal.    Assessment & Plan (Raeshawn Vo A. Calden Dorsey MD; 07/17/2018 4:12 PM)  BREAST CANCER, LEFT (C50.912) Impression: Patient opted for left breast localized lumpectomy with sentinel mapping. Risk of lumpectomy include bleeding, infection, seroma, more surgery, use of seed/wire, wound care, cosmetic deformity and the need for other treatments, death , blood  clots, death. Pt agrees to proceed. Risk of sentinel lymph node mapping include bleeding, infection, lymphedema, shoulder pain. stiffness, dye allergy. cosmetic deformity , blood clots, death, need for more surgery. Pt agres to proceed.  Current Plans  You are being scheduled for surgery- Our schedulers will call you.  You should hear from our office's scheduling department within 5 working days about the location, date, and time of surgery. We try to make accommodations for patient's preferences in scheduling surgery, but sometimes the OR schedule or the surgeon's schedule prevents [place] from making those accommodations.  If you have not heard from our office [phone]) in 5 working days, call the office and ask for your surgeon's nurse.  If you have other questions about your diagnosis, plan, or surgery, call the office and ask for your surgeon's nurse.  Pt Education - [facility] Breast Cancer Information Given - [facility] "Breast Journey" Package We discussed the staging and pathophysiology of breast cancer. We discussed all of the different options for treatment for breast cancer including surgery, chemotherapy, radiation therapy, Herceptin, and antiestrogen therapy. We discussed a sentinel lymph node biopsy as she does not appear to having lymph node involvement right now. We discussed the performance of that with injection of radioactive tracer and blue dye. We discussed that she would have an incision underneath her axillary hairline. We discussed that there is a bout a 10-20% chance of having a positive node with a sentinel lymph node biopsy and we will await the permanent pathology to make any other first further decisions in terms of her treatment. One of these options might be to return to the operating room to perform an axillary lymph node dissection. We discussed about a 1-2% risk lifetime of chronic shoulder pain as well as lymphedema associated with a sentinel lymph node biopsy. We  discussed the options for treatment of the breast cancer which included lumpectomy versus a mastectomy. We discussed the performance of the lumpectomy with a wire placement. We discussed a 10-20% chance of a positive margin requiring reexcision in the operating room. We also discussed that she may need radiation therapy or antiestrogen therapy or both if she undergoes lumpectomy. We discussed the mastectomy and the postoperative care for that as well. We discussed that there is no difference in her survival whether she undergoes lumpectomy with radiation therapy or antiestrogen therapy versus a mastectomy. There is a slight difference in the local recurrence rate being 3-5% with lumpectomy and about 1% with a mastectomy. We discussed the risks of operation including bleeding, infection, possible reoperation. She understands her further therapy will be based on what her stages at the time of her operation.  Pt Education - [facility] breast cancer surgery: discussed with patient and provided information. Pt Education - ABC (After Breast Cancer) Class Info: discussed with patient and provided information.

## 2018-08-22 NOTE — Transfer of Care (Signed)
Immediate Anesthesia Transfer of Care Note  Patient: Veronica Reilly  Procedure(s) Performed: LEFT BREAST LUMPECTOMY WITH RADIOACTIVE SEED AND SENTINEL LYMPH NODE MAPPING (Left Breast)  Patient Location: PACU  Anesthesia Type:General  Level of Consciousness: sedated  Airway & Oxygen Therapy: Patient Spontanous Breathing and Patient connected to face mask oxygen  Post-op Assessment: Report given to RN and Post -op Vital signs reviewed and stable  Post vital signs: Reviewed and stable  Last Vitals:  Vitals Value Taken Time  BP 128/79 08/22/18 1337  Temp    Pulse 110 08/22/18 1339  Resp 11 08/22/18 1339  SpO2 100 % 08/22/18 1339  Vitals shown include unvalidated device data.  Last Pain:  Vitals:   08/22/18 1102  TempSrc: Oral  PainSc: 0-No pain         Complications: No apparent anesthesia complications

## 2018-08-22 NOTE — Anesthesia Preprocedure Evaluation (Signed)
Anesthesia Evaluation  Patient identified by MRN, date of birth, ID band Patient awake    Reviewed: Allergy & Precautions, NPO status , Patient's Chart, lab work & pertinent test results  Airway Mallampati: I  TM Distance: >3 FB Neck ROM: Full    Dental   Pulmonary asthma ,    Pulmonary exam normal        Cardiovascular hypertension, Pt. on medications Normal cardiovascular exam     Neuro/Psych    GI/Hepatic GERD  Medicated and Controlled,  Endo/Other    Renal/GU      Musculoskeletal   Abdominal   Peds  Hematology   Anesthesia Other Findings   Reproductive/Obstetrics                             Anesthesia Physical Anesthesia Plan  ASA: II  Anesthesia Plan: General   Post-op Pain Management:  Regional for Post-op pain   Induction: Intravenous  PONV Risk Score and Plan: 3 and Ondansetron, Dexamethasone and Midazolam  Airway Management Planned: LMA  Additional Equipment:   Intra-op Plan:   Post-operative Plan: Extubation in OR  Informed Consent: I have reviewed the patients History and Physical, chart, labs and discussed the procedure including the risks, benefits and alternatives for the proposed anesthesia with the patient or authorized representative who has indicated his/her understanding and acceptance.       Plan Discussed with: CRNA and Surgeon  Anesthesia Plan Comments:         Anesthesia Quick Evaluation

## 2018-08-22 NOTE — Anesthesia Postprocedure Evaluation (Signed)
Anesthesia Post Note  Patient: Veronica Reilly  Procedure(s) Performed: LEFT BREAST LUMPECTOMY WITH RADIOACTIVE SEED AND SENTINEL LYMPH NODE MAPPING (Left Breast)     Patient location during evaluation: PACU Anesthesia Type: General Level of consciousness: awake and alert Pain management: pain level controlled Vital Signs Assessment: post-procedure vital signs reviewed and stable Respiratory status: spontaneous breathing, nonlabored ventilation, respiratory function stable and patient connected to nasal cannula oxygen Cardiovascular status: blood pressure returned to baseline and stable Postop Assessment: no apparent nausea or vomiting Anesthetic complications: no    Last Vitals:  Vitals:   08/22/18 1415 08/22/18 1445  BP: 125/84 132/89  Pulse: 86 (!) 101  Resp: 17 16  Temp:  37.1 C  SpO2: 100% 97%    Last Pain:  Vitals:   08/22/18 1445  TempSrc:   PainSc: 2                  Jehad Bisono DAVID

## 2018-08-22 NOTE — Anesthesia Procedure Notes (Signed)
Procedure Name: LMA Insertion Date/Time: 08/22/2018 12:14 PM Performed by: Willa Frater, CRNA Pre-anesthesia Checklist: Patient identified, Emergency Drugs available, Suction available and Patient being monitored Patient Re-evaluated:Patient Re-evaluated prior to induction Oxygen Delivery Method: Circle system utilized Preoxygenation: Pre-oxygenation with 100% oxygen Induction Type: IV induction Ventilation: Mask ventilation without difficulty LMA: LMA inserted LMA Size: 4.0 Number of attempts: 1 Airway Equipment and Method: Bite block Placement Confirmation: positive ETCO2 Tube secured with: Tape Dental Injury: Teeth and Oropharynx as per pre-operative assessment

## 2018-08-23 ENCOUNTER — Encounter (HOSPITAL_BASED_OUTPATIENT_CLINIC_OR_DEPARTMENT_OTHER): Payer: Self-pay | Admitting: Surgery

## 2018-08-24 ENCOUNTER — Other Ambulatory Visit: Payer: Self-pay | Admitting: Oncology

## 2018-08-28 ENCOUNTER — Telehealth: Payer: Self-pay | Admitting: *Deleted

## 2018-08-28 NOTE — Telephone Encounter (Signed)
Ordered oncotype per Dr. Magrinat. Requisition sent to pathology 

## 2018-09-05 ENCOUNTER — Ambulatory Visit: Payer: Self-pay | Admitting: Surgery

## 2018-09-05 ENCOUNTER — Telehealth: Payer: Self-pay | Admitting: *Deleted

## 2018-09-05 ENCOUNTER — Other Ambulatory Visit: Payer: Self-pay | Admitting: Oncology

## 2018-09-05 ENCOUNTER — Encounter (HOSPITAL_COMMUNITY): Payer: Self-pay | Admitting: Oncology

## 2018-09-05 NOTE — Telephone Encounter (Signed)
Received oncotype results of 34/22%.  Patient is seeing Dr. Jana Hakim 8/24 to discuss

## 2018-09-07 ENCOUNTER — Encounter: Payer: Self-pay | Admitting: General Practice

## 2018-09-07 NOTE — Progress Notes (Signed)
[place] Initial Psychosocial Assessment Clinical Social Work  Clinical Social Work contacted by phone to assess psychosocial, emotional, mental health, and spiritual needs of the patient.   Barriers to care/review of distress screen:  - Transportation:  Do you anticipate any problems getting to appointments?  Do you have someone who can help run errands for you if you need it?  Deferred - Help at home:  What is your living situation (alone, family, other)?  If you are physically unable to care for yourself, who would you call on to help you? Deferred - Support system:  What does your support system look like?  Who would you call on if you needed some kind of practical help?  What if you needed someone to talk to for emotional support?  Lives w husband - Finances:  Are you concerned about finances (I know, we may not want to go there.Marland Kitchen).  Considering returning to work?  If not, applying for disability?  Currently working, plans to continue.  Worried about cost of medical bills.  Husband out of work, Type 1 diabetic/renal transplant patient.  Now reduced to one income (patient still working).  Has multiple medical bills for breast cancer care and mouth surgery.  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 Its cause?  Your treatment plan and what happens next?  Will see Dr Jana Hakim next week to discuss treatment plan.  Needs to get port put in.    What are your worries for the future as you begin treatment for cancer?  "my mom died of cancer, but I know I have a different type and stage of cancer."  Financial impact.  Side effects of chemo.  Thinking about mother and her death "gets on my brain" sometimes.    CSW Summary:  Patient and family psychosocial functioning including strengths, limitations, and coping skills:  Brief conversation w patient regarding stress  related to finances for continued treatment.  On leave from job, husband is also out of work due to illness and fall at work. Work environment is supportive.  Less concerned about affording daily expenses, now concerned about additional expenses for various treatments.    Identifications of barriers to care: None noted.    Availability of community resources:  Will email information on [place] and [employer].   Clinical Social Worker follow up needed: No.   Pt to call as needed.  Edwyna Shell, LCSW Clinical Social Worker Phone:  [phone]

## 2018-09-10 NOTE — Progress Notes (Signed)
[place]  [phone] [phone]     ID: Veronica Reilly DOB: September 27, 1965  MR#: [id number]  DUK#:[id number]  Patient Care Team: Delsa Bern, MD as PCP - General (Obstetrics and Gynecology) Jamyson Jirak, Virgie Dad, MD as Consulting Physician (Oncology) Kyung Rudd, MD as Consulting Physician (Radiation Oncology) Erroll Luna, MD as Consulting Physician (General Surgery) Delsa Bern, MD as Consulting Physician (Obstetrics and Gynecology) Kennith Gain, MD as Consulting Physician (Allergy) Delrae Rend, MD as Consulting Physician (Endocrinology) Rockwell Germany, RN as Oncology Nurse Navigator Mauro Kaufmann, RN as Oncology Nurse Navigator Chauncey Cruel, MD OTHER MD:  CHIEF COMPLAINT: estrogen receptor positive breast cancer  CURRENT TREATMENT: Adjuvant chemotherapy   INTERVAL HISTORY: Veronica Reilly returns today for follow up of her estrogen receptor positive breast cancer accompanied by her husband  Since her last visit, her genetics testing results came back and were negative.  She proceeded to left breast lumpectomy with sentinel lymph node biopsy on 08/22/2018 under Dr. Brantley Stage. 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  A total of 10 lymph nodes were biopsied. All were negative for carcinoma (0/10).  Oncotype DX was obtained on the final surgical sample and the recurrence score of 34 predicts a risk of recurrence outside the breast over the next 9 years of 22%, if the patient's only systemic therapy is an antiestrogen for 5 years.  It also predicts a significant benefit from chemotherapy.  More recently, on 09/09/2018 she felt "cold" at night.  In the morning she found her left breast to be somewhat swollen and heavy.  It would not fit in her bra.  It was not red.  She developed a temperature up  to 100.4 that evening.  She is scheduled to undergo port-a-cath insertion on 09/27/2018.  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  Everyone is being quite careful and using appropriate precautions.  Aside from the breast issue mentioned above a detailed review of systems today was not contributory  HISTORY OF CURRENT ILLNESS: From the original intake note:  Veronica Reilly had some calcifications in the left breast noted March 2019 on screening mammography.. She underwent biopsy of the left retroareolar area of concern on 03/18/2017 with pathology ([id number]) showing fibrocystic changes with no malignancy. Return in 6 months for left diagnostic mammography and ultrasonography was recommended, and she reports this took place in November 2019 at her gynecologist's office and the changes previously noted were stable.  She underwent bilateral diagnostic mammography with tomography and left breast ultrasonography at [facility] on 06/27/2018 showing: breast density category C; left breast upper-outer quadrant 3.1 cm group of indeterminate calcifications; persistent benign-appearing cyst in the subareolar left breast, post core needle biopsy changes.  On physical exam there were no palpable masses.  Targeted ultrasound revealed no suspicious masses.  There was a left subareolar breast cyst measuring 1.0 cm.  Left axilla was unremarkable.  Accordingly on 06/29/2018 she proceeded to biopsy of the left breast calcifications area in question. The pathology from this procedure ([id number]) showed: invasive ductal carcinoma, grade 2, with ductal carcinoma in situ. Prognostic indicators significant for: estrogen receptor, 100% positive with strong staining intensity and progesterone receptor, 0% negative. Proliferation  marker Ki67 at 10%. HER2 negtive by  immunohistochemistry, (0).  The patient's subsequent history is as detailed below.   PAST MEDICAL HISTORY: Past Medical History:  Diagnosis Date  . ASCUS (atypical squamous cells of undetermined significance) on Pap smear   . Asthma    controlled with daily inhaler  . Breast cancer ([facility]) 06/29/2018   Left IDC  . Dysplasia of cervix, low grade (CIN 1)   . Eczema   . Family history of breast cancer   . Family history of lung cancer   . Family history of prostate cancer   . GERD (gastroesophageal reflux disease)   . Heart murmur   . History of chicken pox   . Hypertension   . Hypothyroid   . Uterine fibroid   Heart murmur, evaluated by cardiologist   PAST SURGICAL HISTORY: Past Surgical History:  Procedure Laterality Date  . ADENOIDECTOMY    . BREAST LUMPECTOMY WITH RADIOACTIVE SEED AND SENTINEL LYMPH NODE BIOPSY Left 08/22/2018   Procedure: LEFT BREAST LUMPECTOMY WITH RADIOACTIVE SEED AND SENTINEL LYMPH NODE MAPPING;  Surgeon: Erroll Luna, MD;  Location: [facility];  Service: General;  Laterality: Left;  . LEEP    . MYOMECTOMY    . TONSILECTOMY, ADENOIDECTOMY, BILATERAL MYRINGOTOMY AND TUBES    . TONSILLECTOMY      FAMILY HISTORY: Family History  Problem Relation Age of Onset  . Breast cancer Maternal Grandmother   . Heart disease Father   . Diabetes Father   . Prostate cancer Father 48  . Breast cancer Mother 101  . Hypertension Other   . Lung cancer Maternal Aunt   . Allergic rhinitis Neg Hx   . Angioedema Neg Hx   . Asthma Neg Hx   . Eczema Neg Hx   . Immunodeficiency Neg Hx   . Urticaria Neg Hx    Patient's father was 41 years old when he died from diabetes and renal failure. Patient's mother died from breast cancer at age 75. The patient notes a family hx of breast and possibly ovarian cancer. Her mother was diagnosed with inflammatory breast cancer at around age 65 (she was a patient here), and the patient maternal grandmother was also  diagnosed with breast cancer and died at age 60.  The patient has 1 brother, no sisters. She also had one maternal aunt with lung cancer and a history of smoking, her father with prostate cancer at age 5, and a possible GYN cancer in a paternal aunt.   GYNECOLOGIC HISTORY:  Last menstrual period February 2020  menarche: 53 years old GX P 0, she has had fertility treatments LMP 05/2018, lasted 3 days, irregular (s/p ablation) Contraceptive: used for apprx. 6 months HRT never used  Hysterectomy? no BSO? 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 She is married. Husband Celesta Gentile is a trauma nurse at [employer]. She lives at home with her husband and some hermit crabs and salt and fresh water fish. 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    ADVANCED DIRECTIVES: Husband Celesta Gentile is her HCPOA.   HEALTH MAINTENANCE: Social History   Tobacco Use  . Smoking status: Never Smoker  . Smokeless tobacco: Never Used  Substance Use Topics  . Alcohol use: No  . Drug use: No     Colonoscopy: Pending  PAP: Up-to-date  Bone density: never done  Allergies  Allergen Reactions  . Apple Anaphylaxis  . Iohexol     Contrast dye  . Other Shortness Of Breath    CHERRIES, TREES, MOLD,DUST, COCKROACHES  . Povidone-Iodine Anaphylaxis  . Shellfish Allergy Anaphylaxis  . Peach [Prunus Persica] Itching and Hives    mouth  . Zocor [Simvastatin] Other (See Comments)    Alopecia  . Betadine [Povidone Iodine]     Due to shellfish allergy  . Latex Rash    Current Outpatient Medications  Medication Sig Dispense Refill  . albuterol (VENTOLIN HFA) 108 (90 Base) MCG/ACT inhaler Use 2 puffs every four hours as needed for cough or wheeze.  May use 2 puffs 10-20 minutes prior to exercise. 1 Inhaler 1  . budesonide-formoterol (SYMBICORT) 160-4.5 MCG/ACT inhaler INHALE 2  PUFFS INTO THE LUNGS 2 (TWO) TIMES DAILY. 10.2 g 5  . cephALEXin (KEFLEX) 500 MG capsule Take 1 capsule (500 mg total) by mouth 2 (two) times daily. 14 capsule 0  . cetirizine (ZYRTEC) 10 MG tablet Take 10 mg by mouth daily.    . cholecalciferol (VITAMIN D) 1000 UNITS tablet Take 2,000 Units by mouth daily.     Marland Kitchen EPINEPHrine (EPIPEN 2-PAK) 0.3 mg/0.3 mL IJ SOAJ injection Use as directed for a threatening allergic reaction (Patient not taking: Reported on 07/13/2018) 4 Device 1  . fluticasone (FLONASE) 50 MCG/ACT nasal spray USE 2 SPRAYS IN EACH NOSTRIL ONCE DAILY FOR STUFFY NOSE OR DRAINAGE 16 g 5  . folic acid (FOLVITE) 1 MG tablet Take 1 mg by mouth daily.    . hydrochlorothiazide (MICROZIDE) 12.5 MG capsule Take 12.5 mg by mouth every morning.  1  . HYDROcodone-acetaminophen (NORCO/VICODIN) 5-325 MG tablet Take 1 tablet by mouth every 6 (six) hours as needed for moderate pain. 15 tablet 0  . ibuprofen (ADVIL) 800 MG tablet Take 1 tablet (800 mg total) by mouth every 8 (eight) hours as needed. 30 tablet 0  . levothyroxine (SYNTHROID, LEVOTHROID) 112 MCG tablet     . montelukast (SINGULAIR) 10 MG tablet Take 1 tablet (10 mg total) by mouth at bedtime. 30 tablet 2  . NIFEdipine (PROCARDIA XL/ADALAT-CC) 60 MG 24 hr tablet Take 60 mg by mouth daily.  1  . Olopatadine HCl (PAZEO) 0.7 % SOLN Apply 1 drop to eye daily. 2.5 mL 5  . triamcinolone cream (KENALOG) 0.1 % Apply 1 application topically daily as needed. (Patient not taking: Reported on 07/13/2018) 80 g 2   No current facility-administered medications for this visit.     OBJECTIVE: Middle-aged African-American woman who appears stated age  87:   09/11/18 1600  BP: 133/86  Pulse: 97  Resp: 18  Temp: 99.8 F (37.7 C)  SpO2: 100%     Body mass index is 35.81 kg/m.   Wt Readings from Last 3 Encounters:  09/11/18 211 lb 14.4 oz (96.1 kg)  08/22/18 213 lb 10 oz (96.9 kg)  07/13/18 216 lb (98 kg)      ECOG FS:1 - Symptomatic but  completely ambulatory  Sclerae unicteric, EOMs intact Wearing a mask No cervical or supraclavicular adenopathy Lungs no rales or rhonchi Heart regular rate and rhythm Abd soft, nontender, positive bowel sounds MSK no focal spinal tenderness, no upper extremity lymphedema Neuro: nonfocal, well oriented, appropriate affect Breasts: The right breast is unremarkable.  The left breast is a bit larger than the left and a bit warmer than the left, but there is no erythema.  There is no tenderness to palpation and  no suspicious palpable mass.  Both axillae are benign.   LAB RESULTS:  CMP     Component Value Date/Time   NA 139 08/18/2018 1100   K 4.1 08/18/2018 1100   CL 104 08/18/2018 1100   CO2 25 08/18/2018 1100   GLUCOSE 85 08/18/2018 1100   BUN 13 08/18/2018 1100   CREATININE 0.86 08/18/2018 1100   CREATININE 1.00 07/11/2018 1235   CALCIUM 9.3 08/18/2018 1100   PROT 7.5 08/18/2018 1100   ALBUMIN 3.9 08/18/2018 1100   AST 15 08/18/2018 1100   AST 11 (L) 07/11/2018 1235   ALT 15 08/18/2018 1100   ALT 13 07/11/2018 1235   ALKPHOS 64 08/18/2018 1100   BILITOT 0.8 08/18/2018 1100   BILITOT 0.4 07/11/2018 1235   GFRNONAA >60 08/18/2018 1100   GFRNONAA >60 07/11/2018 1235   GFRAA >60 08/18/2018 1100   GFRAA >60 07/11/2018 1235    No results found for: TOTALPROTELP, ALBUMINELP, A1GS, A2GS, BETS, BETA2SER, GAMS, MSPIKE, SPEI  No results found for: KPAFRELGTCHN, LAMBDASER, KAPLAMBRATIO  Lab Results  Component Value Date   WBC 7.9 08/18/2018   NEUTROABS 4.3 08/18/2018   HGB 13.4 08/18/2018   HCT 40.9 08/18/2018   MCV 80.5 08/18/2018   PLT 267 08/18/2018    '@LASTCHEMISTRY'$ @  No results found for: LABCA2  No components found for: VOHYWV371  No results for input(s): INR in the last 168 hours.  No results found for: LABCA2  No results found for: GGY694  No results found for: WNI627  No results found for: OJJ009  No results found for: CA2729  No components found  for: HGQUANT  No results found for: CEA1 / No results found for: CEA1   No results found for: AFPTUMOR  No results found for: CHROMOGRNA  No results found for: PSA1  No visits with results within 3 Day(s) from this visit.  Latest known visit with results is:  Admission on 08/22/2018, Discharged on 08/22/2018  Component Date Value Ref Range Status  . WBC 08/18/2018 7.9  4.0 - 10.5 K/uL Final  . RBC 08/18/2018 5.08  3.87 - 5.11 MIL/uL Final  . Hemoglobin 08/18/2018 13.4  12.0 - 15.0 g/dL Final  . HCT 08/18/2018 40.9  36.0 - 46.0 % Final  . MCV 08/18/2018 80.5  80.0 - 100.0 fL Final  . MCH 08/18/2018 26.4  26.0 - 34.0 pg Final  . MCHC 08/18/2018 32.8  30.0 - 36.0 g/dL Final  . RDW 08/18/2018 13.5  11.5 - 15.5 % Final  . Platelets 08/18/2018 267  150 - 400 K/uL Final  . nRBC 08/18/2018 0.0  0.0 - 0.2 % Final  . Neutrophils Relative % 08/18/2018 55  % Final  . Neutro Abs 08/18/2018 4.3  1.7 - 7.7 K/uL Final  . Lymphocytes Relative 08/18/2018 30  % Final  . Lymphs Abs 08/18/2018 2.4  0.7 - 4.0 K/uL Final  . Monocytes Relative 08/18/2018 12  % Final  . Monocytes Absolute 08/18/2018 1.0  0.1 - 1.0 K/uL Final  . Eosinophils Relative 08/18/2018 2  % Final  . Eosinophils Absolute 08/18/2018 0.2  0.0 - 0.5 K/uL Final  . Basophils Relative 08/18/2018 1  % Final  . Basophils Absolute 08/18/2018 0.1  0.0 - 0.1 K/uL Final  . Immature Granulocytes 08/18/2018 0  % Final  . Abs Immature Granulocytes 08/18/2018 0.02  0.00 - 0.07 K/uL Final   Performed at [facility], [place] [address], [place], [place] [address]  . Sodium 08/18/2018  139  135 - 145 mmol/L Final  . Potassium 08/18/2018 4.1  3.5 - 5.1 mmol/L Final  . Chloride 08/18/2018 104  98 - 111 mmol/L Final  . CO2 08/18/2018 25  22 - 32 mmol/L Final  . Glucose, Bld 08/18/2018 85  70 - 99 mg/dL Final  . BUN 08/18/2018 13  6 - 20 mg/dL Final  . Creatinine, Ser 08/18/2018 0.86  0.44 - 1.00 mg/dL Final  . Calcium 08/18/2018 9.3  8.9 - 10.3  mg/dL Final  . Total Protein 08/18/2018 7.5  6.5 - 8.1 g/dL Final  . Albumin 08/18/2018 3.9  3.5 - 5.0 g/dL Final  . AST 08/18/2018 15  15 - 41 U/L Final  . ALT 08/18/2018 15  0 - 44 U/L Final  . Alkaline Phosphatase 08/18/2018 64  38 - 126 U/L Final  . Total Bilirubin 08/18/2018 0.8  0.3 - 1.2 mg/dL Final  . GFR calc non Af Amer 08/18/2018 >60  >60 mL/min Final  . GFR calc Af Amer 08/18/2018 >60  >60 mL/min Final  . Anion gap 08/18/2018 10  5 - 15 Final   Performed at [facility], [place] [address] [place], [place] [address]    (this displays the last labs from the last 3 days)  No results found for: TOTALPROTELP, ALBUMINELP, A1GS, A2GS, BETS, BETA2SER, GAMS, MSPIKE, SPEI (this displays SPEP labs)  No results found for: KPAFRELGTCHN, LAMBDASER, KAPLAMBRATIO (kappa/lambda light chains)  No results found for: HGBA, HGBA2QUANT, HGBFQUANT, HGBSQUAN (Hemoglobinopathy evaluation)   No results found for: LDH  No results found for: IRON, TIBC, IRONPCTSAT (Iron and TIBC)  No results found for: FERRITIN  Urinalysis    Component Value Date/Time   COLORURINE YELLOW 05/15/2007 0957   APPEARANCEUR CLEAR 05/15/2007 0957   LABSPEC 1.010 05/15/2007 0957   PHURINE 8.0 05/15/2007 0957   GLUCOSEU NEGATIVE 05/15/2007 0957   HGBUR NEGATIVE 05/15/2007 0957   BILIRUBINUR NEGATIVE 05/15/2007 0957   KETONESUR NEGATIVE 05/15/2007 0957   PROTEINUR NEGATIVE 05/15/2007 0957   UROBILINOGEN 0.2 05/15/2007 0957   NITRITE NEGATIVE 05/15/2007 0957   LEUKOCYTESUR  05/15/2007 0957    NEGATIVE MICROSCOPIC NOT DONE ON URINES WITH NEGATIVE PROTEIN, BLOOD, LEUKOCYTES, NITRITE, OR GLUCOSE <1000 mg/dL.     STUDIES: Nm Sentinel Node Inj-no Rpt (breast)  Result Date: 08/22/2018 Sulfur colloid was injected by the nuclear medicine technologist for melanoma sentinel node.   Mm Breast Surgical Specimen  Result Date: 08/22/2018 CLINICAL DATA:  53 year old patient had residual calcifications in the lateral  left breast targeted with seed localization on August 21, 2018. The clip from the biopsy performed on 06/29/2018 migrated from the biopsy site, and was therefore not localized. EXAM: SPECIMEN RADIOGRAPH OF THE LEFT BREAST COMPARISON:  Previous exam(s). FINDINGS: Status post excision of the left breast. The radioactive seed is present within a specimen cup, separate from the excised tissue. Some calcifications can be seen within the excised tissue. IMPRESSION: Specimen radiograph of the left breast. Electronically Signed   By: Curlene Dolphin M.D.   On: 08/22/2018 13:15   Mm Lt Radioactive Seed Loc Mammo Guide  Result Date: 08/21/2018 CLINICAL DATA:  Patient presents for mammographically guided needle localization an area of breast carcinoma in the left breast. The clip from the biopsy, performed on 06/29/2018, displaced medially. The residual calcifications in the lateral left breast will be targeted for seed localization. EXAM: MAMMOGRAPHIC GUIDED RADIOACTIVE SEED LOCALIZATION OF THE LEFT BREAST COMPARISON:  Previous exam(s). FINDINGS: Patient presents for radioactive seed  localization prior to surgical excision. I met with the patient and we discussed the procedure of seed localization including benefits and alternatives. We discussed the high likelihood of a successful procedure. We discussed the risks of the procedure including infection, bleeding, tissue injury and further surgery. We discussed the low dose of radioactivity involved in the procedure. Informed, written consent was given. The usual time-out protocol was performed immediately prior to the procedure. Using mammographic guidance, sterile technique, 1% lidocaine and an I-125 radioactive seed, the residual calcifications in the upper outer left breast were localized using a superior approach. The follow-up mammogram images confirm the seed in the expected location and were marked for Dr. Brantley Stage. The coil shaped biopsy clip lies 2 cm inferior and 2 cm  posterior to the radioactive seed and residual calcifications. Follow-up survey of the patient confirms presence of the radioactive seed. Order number of I-125 seed:  [id number]. Total activity:  6.333 millicuries reference Date: 07/28/2018 The patient tolerated the procedure well and was released from the [facility]. She was given instructions regarding seed removal. IMPRESSION: Radioactive seed localization the left breast. No apparent complications. Electronically Signed   By: Lajean Manes M.D.   On: 08/21/2018 14:57    ELIGIBLE FOR AVAILABLE RESEARCH PROTOCOL: no  ASSESSMENT: 53 y.o.  [place] status post left breast upper outer quadrant biopsy 06/29/2018 for a clinical T2N0 invasive ductal carcinoma, grade 2, estrogen receptor positive, progesterone receptor negative, with an MIB-1-1 of 10%, and no HER-2 amplification.  (1) genetics 07/18/2018 through the [facility] Common Hereditary Cancers Panel found no deleterious mutations in APC, ATM, AXIN2, BARD1, BMPR1A, BRCA1, BRCA2, BRIP1, CDH1, CDKN2A (p14ARF), CDKN2A (p16INK4a), CKD4, CHEK2, CTNNA1, DICER1, EPCAM (Deletion/duplication testing only), GREM1 (promoter region deletion/duplication testing only), KIT, MEN1, MLH1, MSH2, MSH3, MSH6, MUTYH, NBN, NF1, NHTL1, PALB2, PDGFRA, PMS2, POLD1, POLE, PTEN, RAD50, RAD51C, RAD51D, SDHB, SDHC, SDHD, SMAD4, SMARCA4. STK11, TP53, TSC1, TSC2, and VHL.  The following genes were evaluated for sequence changes only: SDHA and HOXB13 c.251G>A variant only.   (2) status post left lumpectomy and sentinel lymph node sampling 08/22/2018 for a pT2 pN0, stage IB invasive ductal carcinoma, grade 2, with negative margins  (a) a total of 10 axillary lymph nodes were removed  (3) Oncotype score of 34 predicts a risk of recurrence outside the breast in the next 9 years of 22% if the patient's only systemic therapy is antiestrogens for 5 years.  It also predicts significant benefit from chemotherapy.  (4) adjuvant  chemotherapy will consist of cyclophosphamide and docetaxel given every 21 days x 4 beginning on 09/28/2018  (5) adjuvant radiation to follow  (6) antiestrogens to start at the completion of local treatment.  PLAN: We spent approximately 45 minutes going over her situation.  She understands she has a high Oncotype and this tells [place] that she has a significant risk of recurrence outside the breast with antiestrogens alone.  It also tells [place] that she can expect a significant benefit from her chemotherapy.  She is agreeable to proceeding to chemo.  We then discussed standard of care which is cyclophosphamide, doxorubicin and paclitaxel, as compared to the frequently used alternative cyclophosphamide and docetaxel.  They are very concerned about heart issues so they strongly prefer the latter.  We discussed the risk of permanent hair loss with docetaxel and the possible use of the Digna cap.  She is very interested in proceeding with this.  She will discuss it with 1 of my patients who recently went through  this process and also with our IMA therapy teaching nurse.  We discussed working through chemo if possible.  She thinks she may be able to do it but of course if she does not I will be glad to write her letter requesting that she work from home until the chemotherapy has been completed.  Tentatively we are scheduling the port for 09/28/2018.  She will see me that day.  She understands she needs to start dexamethasone the day before.  We will then see her a week later to make sure she tolerated chemotherapy well.  She knows to call for any other issue that may develop before the next visit.  Chauncey Cruel, MD   09/11/2018 6:25 PM Medical Oncology and Hematology [facility] [address] [place], [place] [address] Tel. [phone]    Fax. [phone]   This document serves as a record of services personally performed by Lurline Del, MD. It was created on his behalf by  Wilburn Mylar, a trained medical scribe. The creation of this record is based on the scribe's personal observations and the provider's statements to them.   I, Lurline Del MD, have reviewed the above documentation for accuracy and completeness, and I agree with the above.

## 2018-09-11 ENCOUNTER — Telehealth: Payer: Self-pay | Admitting: *Deleted

## 2018-09-11 ENCOUNTER — Inpatient Hospital Stay: Payer: BC Managed Care – PPO | Attending: Oncology | Admitting: Oncology

## 2018-09-11 ENCOUNTER — Other Ambulatory Visit: Payer: Self-pay

## 2018-09-11 VITALS — BP 133/86 | HR 97 | Temp 99.8°F | Resp 18 | Ht 64.5 in | Wt 211.9 lb

## 2018-09-11 DIAGNOSIS — Z803 Family history of malignant neoplasm of breast: Secondary | ICD-10-CM | POA: Insufficient documentation

## 2018-09-11 DIAGNOSIS — Z79899 Other long term (current) drug therapy: Secondary | ICD-10-CM | POA: Diagnosis not present

## 2018-09-11 DIAGNOSIS — J45909 Unspecified asthma, uncomplicated: Secondary | ICD-10-CM | POA: Diagnosis not present

## 2018-09-11 DIAGNOSIS — Z8042 Family history of malignant neoplasm of prostate: Secondary | ICD-10-CM | POA: Insufficient documentation

## 2018-09-11 DIAGNOSIS — I10 Essential (primary) hypertension: Secondary | ICD-10-CM | POA: Diagnosis not present

## 2018-09-11 DIAGNOSIS — C50412 Malignant neoplasm of upper-outer quadrant of left female breast: Secondary | ICD-10-CM | POA: Insufficient documentation

## 2018-09-11 DIAGNOSIS — Z170 Estrogen receptor positive status [ER+]: Secondary | ICD-10-CM

## 2018-09-11 DIAGNOSIS — E039 Hypothyroidism, unspecified: Secondary | ICD-10-CM | POA: Insufficient documentation

## 2018-09-11 DIAGNOSIS — Z801 Family history of malignant neoplasm of trachea, bronchus and lung: Secondary | ICD-10-CM | POA: Insufficient documentation

## 2018-09-11 MED ORDER — DEXAMETHASONE 4 MG PO TABS: 8.0000 mg | ORAL_TABLET | Freq: Two times a day (BID) | ORAL | 1 refills | Status: DC

## 2018-09-11 MED ORDER — PROCHLORPERAZINE MALEATE 10 MG PO TABS: 10.0000 mg | ORAL_TABLET | Freq: Four times a day (QID) | ORAL | 1 refills | Status: DC | PRN

## 2018-09-11 MED ORDER — LIDOCAINE-PRILOCAINE 2.5-2.5 % EX CREA: TOPICAL_CREAM | CUTANEOUS | 3 refills | Status: DC

## 2018-09-11 MED ORDER — LORAZEPAM 0.5 MG PO TABS: 0.5000 mg | ORAL_TABLET | Freq: Every evening | ORAL | 0 refills | Status: DC | PRN

## 2018-09-11 MED ORDER — CEPHALEXIN 500 MG PO CAPS: 500.0000 mg | ORAL_CAPSULE | Freq: Two times a day (BID) | ORAL | 0 refills | Status: DC

## 2018-09-11 NOTE — Progress Notes (Signed)
START ON PATHWAY REGIMEN - Breast     A cycle is every 21 days:     Docetaxel      Cyclophosphamide   **Always confirm dose/schedule in your pharmacy ordering system**  Patient Characteristics: Postoperative without Neoadjuvant Therapy (Pathologic Staging), Invasive Disease, Adjuvant Therapy, HER2 Negative/Unknown/Equivocal, ER Positive, Node Negative, pT1a-c, pN0/N75m or pT2 or Higher, pN0, Oncotype High Risk (? 26) Therapeutic Status: Postoperative without Neoadjuvant Therapy (Pathologic Staging) AJCC Grade: GX AJCC N Category: pNX AJCC M Category: cM0 ER Status: Positive (+) AJCC 8 Stage Grouping: IB HER2 Status: Negative (-) Oncotype Dx Recurrence Score: 34 AJCC T Category: pTX PR Status: Positive (+) Has this patient completed genomic testing<= Yes - Oncotype DX(R) Intent of Therapy: Curative Intent, Discussed with Patient

## 2018-09-11 NOTE — Telephone Encounter (Signed)
Called pt & discussed Digni-cap & left brochure at front desk for her to p/u.

## 2018-09-11 NOTE — Telephone Encounter (Signed)
This RN spoke with pt per her VM left this morning stating onset of chills over the weekend without fever and then " the breast that had the lumpectomy just feels swollen, full and heavy "  Pt is scheduled for routine follow up this afternoon with Dr Jannifer Rodney.  This note will be given to him for review per visit.

## 2018-09-12 ENCOUNTER — Encounter: Payer: Self-pay | Admitting: *Deleted

## 2018-09-12 ENCOUNTER — Telehealth: Payer: Self-pay | Admitting: Oncology

## 2018-09-12 NOTE — Telephone Encounter (Signed)
I talk with patient regarding schedule, val said she would talk with Md about injection

## 2018-09-13 ENCOUNTER — Telehealth: Payer: Self-pay | Admitting: *Deleted

## 2018-09-17 ENCOUNTER — Other Ambulatory Visit: Payer: Self-pay

## 2018-09-17 ENCOUNTER — Encounter (HOSPITAL_COMMUNITY): Payer: Self-pay

## 2018-09-17 ENCOUNTER — Emergency Department (HOSPITAL_COMMUNITY)
Admission: EM | Admit: 2018-09-17 | Discharge: 2018-09-17 | Disposition: A | Discharge status: Home / Self Care | Payer: BC Managed Care – PPO | Attending: Emergency Medicine | Admitting: Emergency Medicine

## 2018-09-17 DIAGNOSIS — Z853 Personal history of malignant neoplasm of breast: Secondary | ICD-10-CM | POA: Diagnosis not present

## 2018-09-17 DIAGNOSIS — I10 Essential (primary) hypertension: Secondary | ICD-10-CM | POA: Diagnosis not present

## 2018-09-17 DIAGNOSIS — Z79899 Other long term (current) drug therapy: Secondary | ICD-10-CM | POA: Insufficient documentation

## 2018-09-17 DIAGNOSIS — L7682 Other postprocedural complications of skin and subcutaneous tissue: Secondary | ICD-10-CM | POA: Insufficient documentation

## 2018-09-17 NOTE — ED Triage Notes (Signed)
Pt reports lumpectomy on left side and biopsy of lymph nodes 08/22/2018. Breast CA Pt. Pt had drainage from left incision/wound. Seen Monday for same. Given ABX Monday. Began draining yellow fluid again today.

## 2018-09-17 NOTE — ED Provider Notes (Signed)
[facility] DEPT Provider Note   CSN: [id number] Arrival date & time: 09/17/18  0920     History   Chief Complaint Chief Complaint  Patient presents with  . Wound Check  . Post-op Problem    HPI Veronica Reilly is a 53 y.o. female with history of invasive ductal carcinoma of the left breast, hypertension, hypothyroidism presents for evaluation of acute onset, progressively worsening drainage from surgical site today.  She reports she underwent left breast lumpectomy and biopsy of lymph nodes on 08/22/2018 with Dr. Brantley Stage and tolerated the procedure without difficulty.  She followed up with her oncologist on 09/11/2018 and noted that her left breast felt swollen and hot so she was started on Keflex 500 mg twice daily which she reports she has been taking.  Today when she awoke she noted a large amount of drainage coming from her surgical site.  She has had an area to the site that she states "never really healed up"; denies any change tot he appearance of her site. Denies any significant pain around the breast.  She denies any fever, nausea, vomiting, chest pain, shortness of breath, cough, abdominal pain.  She has been applying gauze to the area to absorb the drainage with improvement.     The history is provided by the patient.    Past Medical History:  Diagnosis Date  . ASCUS (atypical squamous cells of undetermined significance) on Pap smear   . Asthma    controlled with daily inhaler  . Breast cancer ([place]) 06/29/2018   Left IDC  . Dysplasia of cervix, low grade (CIN 1)   . Eczema   . Family history of breast cancer   . Family history of lung cancer   . Family history of prostate cancer   . GERD (gastroesophageal reflux disease)   . Heart murmur   . History of chicken pox   . Hypertension   . Hypothyroid   . Uterine fibroid     Patient Active Problem List   Diagnosis Date Noted  . Genetic testing 07/19/2018  . Family history of breast  cancer   . Family history of prostate cancer   . Family history of lung cancer   . Malignant neoplasm of upper-outer quadrant of left breast in female, estrogen receptor positive ([place]) 07/05/2018  . Moderate persistent asthma with (acute) exacerbation 03/30/2018  . Seasonal and perennial allergic rhinoconjunctivitis 03/30/2018  . 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 Hypothyroidism 02/09/2012  . Fibroids 02/09/2012  . Dysplasia of cervix, low grade (CIN 1) 09/21/2011    Past Surgical History:  Procedure Laterality Date  . ADENOIDECTOMY    . BREAST LUMPECTOMY WITH RADIOACTIVE SEED AND SENTINEL LYMPH NODE BIOPSY Left 08/22/2018   Procedure: LEFT BREAST LUMPECTOMY WITH RADIOACTIVE SEED AND SENTINEL LYMPH NODE MAPPING;  Surgeon: Erroll Luna, MD;  Location: [facility];  Service: General;  Laterality: Left;  . LEEP    . MYOMECTOMY    . TONSILECTOMY, ADENOIDECTOMY, BILATERAL MYRINGOTOMY AND TUBES    . TONSILLECTOMY       OB History    Gravida  0   Para  0   Term  0   Preterm  0   AB  0   Living  0     SAB  0   TAB  0   Ectopic  0   Multiple  0   Live Births  Home Medications    Prior to Admission medications   Medication Sig Start Date End Date Taking? Authorizing Provider  albuterol (VENTOLIN HFA) 108 (90 Base) MCG/ACT inhaler Use 2 puffs every four hours as needed for cough or wheeze.  May use 2 puffs 10-20 minutes prior to exercise. 12/08/17  Yes Padgett, Rae Halsted, MD  budesonide-formoterol (SYMBICORT) 160-4.5 MCG/ACT inhaler INHALE 2 PUFFS INTO THE LUNGS 2 (TWO) TIMES DAILY. Patient taking differently: Inhale 2 puffs into the lungs daily.  12/08/17  Yes Padgett, Rae Halsted, MD  cephALEXin (KEFLEX) 500 MG capsule Take 1 capsule (500 mg total) by mouth 2 (two) times daily. 09/11/18  Yes Magrinat, Virgie Dad, MD  Cholecalciferol (VITAMIN D3) 50 MCG (2000 UT) TABS Take 2,000 Units by mouth every evening.   Yes  [provider]  dexamethasone (DECADRON) 4 MG tablet Take 2 tablets (8 mg total) by mouth 2 (two) times daily. Start the day before Taxotere. Then again the day after chemo for 3 days. 09/11/18  Yes Magrinat, Virgie Dad, MD  EPINEPHrine (EPIPEN 2-PAK) 0.3 mg/0.3 mL IJ SOAJ injection Use as directed for a threatening allergic reaction Patient taking differently: Inject 0.3 mg into the muscle as needed for anaphylaxis. Use as directed for a threatening allergic reaction 12/08/17  Yes Padgett, Rae Halsted, MD  fluticasone (FLONASE) 50 MCG/ACT nasal spray USE 2 SPRAYS IN EACH NOSTRIL ONCE DAILY FOR STUFFY NOSE OR DRAINAGE Patient taking differently: Place 2 sprays into both nostrils daily. USE 2 SPRAYS IN EACH NOSTRIL ONCE DAILY FOR STUFFY NOSE OR DRAINAG 12/08/17  Yes Padgett, Rae Halsted, MD  folic acid (FOLVITE) 1 MG tablet Take 1 mg by mouth every evening.    Yes [provider]  hydrochlorothiazide (MICROZIDE) 12.5 MG capsule Take 12.5 mg by mouth daily.  07/02/16  Yes [provider]  levocetirizine (XYZAL) 5 MG tablet Take 5 mg by mouth daily.   Yes [provider]  levothyroxine (SYNTHROID, LEVOTHROID) 112 MCG tablet Take 112 mcg by mouth daily before breakfast.  04/26/17  Yes [provider]  lidocaine-prilocaine (EMLA) cream Apply to affected area once 09/11/18  Yes Magrinat, Virgie Dad, MD  LORazepam (ATIVAN) 0.5 MG tablet Take 1 tablet (0.5 mg total) by mouth at bedtime as needed (Nausea or vomiting). 09/11/18  Yes Magrinat, Virgie Dad, MD  montelukast (SINGULAIR) 10 MG tablet Take 1 tablet (10 mg total) by mouth at bedtime. 07/18/18  Yes Padgett, Rae Halsted, MD  NIFEdipine (PROCARDIA XL/ADALAT-CC) 60 MG 24 hr tablet Take 60 mg by mouth daily. 07/02/16  Yes [provider]  Olopatadine HCl (PAZEO) 0.7 % SOLN Apply 1 drop to eye daily. Patient taking differently: Apply 1 drop to eye daily as needed (allergies.).  12/08/17  Yes Padgett,  Rae Halsted, MD  prochlorperazine (COMPAZINE) 10 MG tablet Take 1 tablet (10 mg total) by mouth every 6 (six) hours as needed (Nausea or vomiting). 09/11/18  Yes Magrinat, Virgie Dad, MD  triamcinolone cream (KENALOG) 0.1 % Apply 1 application topically daily as needed. Patient taking differently: Apply 1 application topically daily as needed. eczema. 12/08/17  Yes Padgett, Rae Halsted, MD  HYDROcodone-acetaminophen (NORCO/VICODIN) 5-325 MG tablet Take 1 tablet by mouth every 6 (six) hours as needed for moderate pain. Patient not taking: Reported on 09/15/2018 08/22/18   Erroll Luna, MD  ibuprofen (ADVIL) 800 MG tablet Take 1 tablet (800 mg total) by mouth every 8 (eight) hours as needed. Patient not taking: Reported on 09/15/2018 08/22/18   Cornett,  Marcello Moores, MD    Family History Family History  Problem Relation Age of Onset  . Breast cancer Maternal Grandmother   . Heart disease Father   . Diabetes Father   . Prostate cancer Father 33  . Breast cancer Mother 66  . Hypertension Other   . Lung cancer Maternal Aunt   . Allergic rhinitis Neg Hx   . Angioedema Neg Hx   . Asthma Neg Hx   . Eczema Neg Hx   . Immunodeficiency Neg Hx   . Urticaria Neg Hx     Social History Social History   Tobacco Use  . Smoking status: Never Smoker  . Smokeless tobacco: Never Used  Substance Use Topics  . Alcohol use: No  . Drug use: No     Allergies   Apple, Iohexol, Other, Povidone-iodine, Shellfish allergy, Peach [prunus persica], Zocor [simvastatin], Betadine [povidone iodine], and Latex   Review of Systems Review of Systems  Constitutional: Negative for chills and fever.  Respiratory: Negative for shortness of breath.   Cardiovascular: Negative for chest pain.  Gastrointestinal: Negative for abdominal pain, nausea and vomiting.  Skin: Positive for wound.  All other systems reviewed and are negative.    Physical Exam Updated Vital Signs BP (!) 152/91   Pulse 95   Temp 97.8  F (36.6 C) (Oral)   Resp 16   Ht 5\' 6"  (1.676 m)   Wt 95.7 kg   SpO2 100%   BMI 34.06 kg/m   Physical Exam Vitals signs and nursing note reviewed.  Constitutional:      General: She is not in acute distress.    Appearance: She is well-developed.  HENT:     Head: Normocephalic and atraumatic.  Eyes:     General:        Right eye: No discharge.        Left eye: No discharge.     Conjunctiva/sclera: Conjunctivae normal.  Neck:     Vascular: No JVD.     Trachea: No tracheal deviation.  Cardiovascular:     Rate and Rhythm: Normal rate and regular rhythm.  Pulmonary:     Effort: Pulmonary effort is normal.     Breath sounds: Normal breath sounds.  Chest:     Comments: Examination performed in the presence of a chaperone. 4 cm surgical incision to the left lateral breast near the axilla.  Most superior portion measuring 1.8 cm appears mildly dehisced and is draining serous fluid.  She has no significant tenderness, no surrounding erythema or induration. Abdominal:     General: Bowel sounds are normal. There is no distension.     Palpations: Abdomen is soft.     Tenderness: There is no abdominal tenderness. There is no guarding or rebound.  Skin:    General: Skin is warm and dry.     Findings: No erythema.  Neurological:     Mental Status: She is alert.  Psychiatric:        Behavior: Behavior normal.        ED Treatments / Results  Labs (all labs ordered are listed, but only abnormal results are displayed) Labs Reviewed - No data to display  EKG None  Radiology No results found.  Procedures Procedures (including critical care time)  Medications Ordered in ED Medications - No data to display   Initial Impression / Assessment and Plan / ED Course  I have reviewed the triage vital signs and the nursing notes.  Pertinent labs & imaging results  that were available during my care of the patient were reviewed by me and considered in my medical decision making  (see chart for details).        Patient presents for evaluation of sudden onset drainage from surgical site beginning today.  She is afebrile, vital signs are stable.  She is nontoxic in appearance.  Resting very comfortably in bed.  Physical examination suggestive of seroma, no evidence of surrounding erythema, induration, or tenderness.  She has an area of very superficial wound dehiscence that has been present since the surgery she states with no worsening today does not appear to track deeply.  She has no constitutional symptoms and does not appear to be overtly septic at this time.  10:21 AM CONSULT: Spoke with Dr. Lucia Gaskins with general surgery.  He recommends frequent dressing changes, showering warm water, and calling Dr. Josetta Huddle office first thing tomorrow morning to establish a follow-up appointment in the next 24 to 48 hours.  Given the incision overall is well-appearing with no evidence of surrounding secondary skin infection, no emergent indication for further imaging or blood work.  I encouraged the patient to take her antibiotics to completion and we discussed wound care and plan to call the general surgery office tomorrow to set up an appointment.  Discussed strict ED return precautions.  Patient verbalized understanding of and agreement with plan and patient stable for discharge home at this time.  Discussed with Dr. Zenia Resides who agrees with assessment and plan at this time.  Final Clinical Impressions(s) / ED Diagnoses   Final diagnoses:  Postoperative surgical complication involving skin associated with non-dermatologic procedure, unspecified complication    ED Discharge Orders    None       Debroah Baller 09/17/18 1059    Lacretia Leigh, MD 09/18/18 [phone]

## 2018-09-17 NOTE — Discharge Instructions (Signed)
Call your general surgeons office first thing tomorrow morning to set up follow-up and will need in the next 1 to 3 days.  In the meantime, change your dressings as needed at least once or twice daily.  You can get in a hot shower and express some of the drainage and after getting out of the shower can apply a new clean dressing.  Return to the emergency department immediately for any concerning signs or symptoms develop such as fevers, redness, spread of redness around the wound, persistent vomiting, or separation of the incision.

## 2018-09-18 ENCOUNTER — Inpatient Hospital Stay: Payer: BC Managed Care – PPO

## 2018-09-18 ENCOUNTER — Ambulatory Visit: Payer: Self-pay | Admitting: Surgery

## 2018-09-18 ENCOUNTER — Other Ambulatory Visit: Payer: Self-pay

## 2018-09-18 NOTE — H&P (Signed)
[name] Documented: 09/18/2018 10:49 AM Location: [facility] Patient #: [id number] DOB: [birth year] Married / Language: English / Race: Black or African American Female  History of Present Illness Marcello Moores A. Roux Brandy MD; 09/18/2018 12:27 PM) Patient words: Patient returns 4 weeks after left breast lumpectomy for stage II left breast cancer. Her SCORE was elevated therefore requires postoperative chemotherapy. She was seen yesterday due to leakage from her incision in the emergency room. This appeared to be large seroma that is improved today. She was placed on antibiotics last week by her medical oncologist. She denies fever, redness, and the pain she had is much improved since the cavity decompressed.           Diagnosis 1. 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 - FINAL MARGINS (PARTS #12-17) ARE NEGATIVE. - BIOPSY SITE. - SEE ONCOLOGY TABLE. 2. Lymph node, sentinel, biopsy, Left Axillary - ONE OF ONE LYMPH NODES NEGATIVE FOR CARCINOMA (0/1). 3. Lymph node, sentinel, biopsy, Left - ONE OF ONE LYMPH NODES NEGATIVE FOR CARCINOMA (0/1). 4. Lymph node, sentinel, biopsy, Left - ONE OF ONE LYMPH NODES NEGATIVE FOR CARCINOMA (0/1). 5. Lymph node, sentinel, biopsy, Left - ONE OF ONE LYMPH NODES NEGATIVE FOR CARCINOMA (0/1). 6. Lymph node, sentinel, biopsy, Left - ONE OF ONE LYMPH NODES NEGATIVE FOR CARCINOMA (0/1). 7. Lymph node, sentinel, biopsy, Left - ONE OF ONE LYMPH NODES NEGATIVE FOR CARCINOMA (0/1). 8. Lymph node, sentinel, biopsy, Left - ONE OF ONE LYMPH NODES NEGATIVE FOR CARCINOMA (0/1). 9. Lymph node, sentinel, biopsy, Left - ONE OF ONE LYMPH NODES NEGATIVE FOR CARCINOMA (0/1). 10. Lymph node, sentinel, biopsy, Left - ONE OF ONE LYMPH NODES NEGATIVE FOR CARCINOMA (0/1). 11. Lymph node, sentinel, biopsy, Left - ONE OF ONE LYMPH NODES NEGATIVE FOR  CARCINOMA (0/1). 12. Breast, excision, Left Superior Margin - ATYPICAL DUCTAL HYPERPLASIA. - PSEUDOANGIOMATOUS STROMAL HYPERPLASIA ([place]). 1 of 5 FINAL for RAMONA, RUARK ANN [id number]) Diagnosis(continued) - [place]. 13. Breast, excision, Left Inferior Margin - ATYPICAL DUCTAL HYPERPLASIA. - PSEUDOANGIOMATOUS STROMAL HYPERPLASIA ([place]). - FIBROCYSTIC AND FIBROADENOMATOID CHANGE. - ADENOSIS. 14. Breast, excision, Left Medial Margin - ATYPICAL DUCTAL HYPERPLASIA. - PSEUDOANGIOMATOUS STROMAL HYPERPLASIA ([place]). - FIBROCYSTIC AND FIBROADENOMATOID CHANGE. - ADENOSIS. 15. Breast, excision, Left Lateral Margin - BENIGN BREAST TISSUE. 16. Breast, excision, Left Anterior Margin - BENIGN BREAST TISSUE. 17. Breast, excision, Left Posterior Margin - ATYPICAL DUCTAL HYPERPLASIA. - PSEUDOANGIOMATOUS STROMAL HYPERPLASIA ([place]). - FIBROCYSTIC AND FIBROADENOMATOID CHANGE. - ADENOSIS. Microscopic Comment 1. INVASIVE CARCINOMA OF THE BREAST: Resection Procedure: Left lumpectomy with additional margins and left axillary sentinel lymph node biopsies. Specimen Laterality: Left. Tumor Size: 2.8 cm. Histologic Type: Invasive ductal carcinoma. Histologic Grade: Glandular ([place])/Tubular Differentiation: 3 Nuclear Pleomorphism: 2 Mitotic Rate: 1 Overall Grade: 2 Ductal Carcinoma In Situ: Present, intermediate grade with necrosis. Tumor Extension: Limited to breast parenchyma. Margins: Distance from closest margin (millimeters): > 10 mm all final margins (parts #12-17) Specify closest margin (required only if <59m): N/A. DCIS Margins Distance from closest margin (millimeters): > 10 mm all final margins (parts #12-17) Specify closest margin (required only if <147m: N/A. Regional Lymph Nodes: Number of Lymph Nodes Examined: 10 Number of Sentinel Nodes Examined (if applicable): 10 Number of Lymph Nodes with Macrometastases (>2 mm): 0 Number of  Lymph Nodes with Micrometastases: 0 Number of Lymph Nodes with Isolated Tumor Cells (0.2 mm or 200 cells)#: 0 Size of  Largest Metastatic Deposit (millimeters): N/A. 2 of 5 FINAL for ANALISSA, BAYLESS ANN [id number]) Microscopic Comment(continued) Extranodal Extension: N/A. Treatment Effect: No known presurgical therapy Breast Biomarker Testing Performed on Previous Biopsy: Testing Performed on Case Number: [id number] Estrogen Receptor: Positive, 100% strong staining. Progesterone Receptor: Negative. HER2: Negative. Ki-67: 10%. Representative tumor block: 1D, 1E Pathologic Stage Classification (pTNM, AJCC 8th Edition): pT2, pN0 (v4.3.0.0) Vicente Males MD Pathologist, Electronic Signature (Case signed 08/24/2018).  The patient is a 53 year old female.   Allergies Sabino Gasser, CMA; 09/18/2018 10:49 AM) Zocor *ANTIHYPERLIPIDEMICS* Allergies Reconciled  Medication History Sabino Gasser, CMA; 09/18/2018 10:50 AM) hydroCHLOROthiazide (12.'5MG'$  Capsule, Oral) Active. Folic Acid ('1MG'$  Tablet, Oral) Active. Flonase (50MCG/DOSE Inhaler, Nasal) Active. Levothyroxine Sodium (112MCG Tablet, Oral) Active. Symbicort (160-4.5MCG/ACT Aerosol, Inhalation) Active. Singulair ('10MG'$  Tablet, Oral) Active. Vitamin D (1000UNIT Tablet, Oral) Active. NIFEdipine ER Osmotic Release ('60MG'$  Tablet ER 24HR, Oral) Active. Medications Reconciled    Vitals Sabino Gasser CMA; 09/18/2018 10:51 AM) 09/18/2018 10:50 AM Weight: 211.38 lb Height: 66in Body Surface Area: 2.05 m Body Mass Index: 34.12 kg/m  Temp.: 97.23F(Oral)  Pulse: 96 (Regular)  BP: 130/82 (Sitting, Left Arm, Standard)        Physical Exam (Shaneen Reeser A. Retta Pitcher MD; 09/18/2018 12:27 PM)  Breast Note: Left extra incisions intact. Very small residual seroma admitted. No fluctuance or foul-smelling drainage noted. No erythema noted.    Assessment & Plan (Hewitt Garner A. Ace Bergfeld MD; 09/18/2018 11:05 AM)  POST-OPERATIVE  STATE [phone]) Impression: Spontaneous drainage of left axillary seroma. She feels better. There is no signs of infection or residual seroma today. She is on the schedule for 9 September for port placement. Pt requires port placement for chemotherapy. Risk include bleeding, infection, pneumothorax, hemothorax, mediastinal injury, nerve injury , blood vessel injury, stroke, blood clots, death, migration. embolization and need for additional procedures. Pt agrees to proceed.  Current Plans Pt Education - [facility] Free Text Education/Instructions: discussed with patient and provided information.

## 2018-09-18 NOTE — H&P (View-Only) (Signed)
Veronica Reilly Documented: 09/18/2018 10:49 AM Location: [name] #: [id number] DOB: 1966-01-03 Married / Language: English / Race: Black or African American Female  History of Present Illness Veronica Reilly A. Fallan Mccarey MD; 09/18/2018 12:27 PM) Patient words: Patient returns 4 weeks after left breast lumpectomy for stage II left breast cancer. Her SCORE was elevated therefore requires postoperative chemotherapy. She was seen yesterday due to leakage from her incision in the emergency room. This appeared to be large seroma that is improved today. She was placed on antibiotics last week by her medical oncologist. She denies fever, redness, and the pain she had is much improved since the cavity decompressed.           Diagnosis 1. 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 - FINAL MARGINS (PARTS #12-17) ARE NEGATIVE. - BIOPSY SITE. - SEE ONCOLOGY TABLE. 2. Lymph node, sentinel, biopsy, Left Axillary - ONE OF ONE LYMPH NODES NEGATIVE FOR CARCINOMA (0/1). 3. Lymph node, sentinel, biopsy, Left - ONE OF ONE LYMPH NODES NEGATIVE FOR CARCINOMA (0/1). 4. Lymph node, sentinel, biopsy, Left - ONE OF ONE LYMPH NODES NEGATIVE FOR CARCINOMA (0/1). 5. Lymph node, sentinel, biopsy, Left - ONE OF ONE LYMPH NODES NEGATIVE FOR CARCINOMA (0/1). 6. Lymph node, sentinel, biopsy, Left - ONE OF ONE LYMPH NODES NEGATIVE FOR CARCINOMA (0/1). 7. Lymph node, sentinel, biopsy, Left - ONE OF ONE LYMPH NODES NEGATIVE FOR CARCINOMA (0/1). 8. Lymph node, sentinel, biopsy, Left - ONE OF ONE LYMPH NODES NEGATIVE FOR CARCINOMA (0/1). 9. Lymph node, sentinel, biopsy, Left - ONE OF ONE LYMPH NODES NEGATIVE FOR CARCINOMA (0/1). 10. Lymph node, sentinel, biopsy, Left - ONE OF ONE LYMPH NODES NEGATIVE FOR CARCINOMA (0/1). 11. Lymph node, sentinel, biopsy, Left - ONE OF ONE LYMPH NODES NEGATIVE FOR  CARCINOMA (0/1). 12. Breast, excision, Left Superior Margin - ATYPICAL DUCTAL HYPERPLASIA. - PSEUDOANGIOMATOUS STROMAL HYPERPLASIA ([place]). 1 of 5 FINAL for Veronica, ZIRKLE Reilly [phone]) Diagnosis(continued) - [place]. 13. Breast, excision, Left Inferior Margin - ATYPICAL DUCTAL HYPERPLASIA. - PSEUDOANGIOMATOUS STROMAL HYPERPLASIA ([place]). - FIBROCYSTIC AND FIBROADENOMATOID CHANGE. - ADENOSIS. 14. Breast, excision, Left Medial Margin - ATYPICAL DUCTAL HYPERPLASIA. - PSEUDOANGIOMATOUS STROMAL HYPERPLASIA ([place]). - FIBROCYSTIC AND FIBROADENOMATOID CHANGE. - ADENOSIS. 15. Breast, excision, Left Lateral Margin - BENIGN BREAST TISSUE. 16. Breast, excision, Left Anterior Margin - BENIGN BREAST TISSUE. 17. Breast, excision, Left Posterior Margin - ATYPICAL DUCTAL HYPERPLASIA. - PSEUDOANGIOMATOUS STROMAL HYPERPLASIA ([place]). - FIBROCYSTIC AND FIBROADENOMATOID CHANGE. - ADENOSIS. Microscopic Comment 1. INVASIVE CARCINOMA OF THE BREAST: Resection Procedure: Left lumpectomy with additional margins and left axillary sentinel lymph node biopsies. Specimen Laterality: Left. Tumor Size: 2.8 cm. Histologic Type: Invasive ductal carcinoma. Histologic Grade: Glandular (Acinar)/Tubular Differentiation: 3 Nuclear Pleomorphism: 2 Mitotic Rate: 1 Overall Grade: 2 Ductal Carcinoma In Situ: Present, intermediate grade with necrosis. Tumor Extension: Limited to breast parenchyma. Margins: Distance from closest margin (millimeters): > 10 mm all final margins (parts #12-17) Specify closest margin (required only if <39m): N/A. DCIS Margins Distance from closest margin (millimeters): > 10 mm all final margins (parts #12-17) Specify closest margin (required only if <113m: N/A. Regional Lymph Nodes: Number of Lymph Nodes Examined: 10 Number of Sentinel Nodes Examined (if applicable): 10 Number of Lymph Nodes with Macrometastases (>2 mm): 0 Number of  Lymph Nodes with Micrometastases: 0 Number of Lymph Nodes with Isolated Tumor Cells (0.2 mm or 200 cells)#: 0 Size of  Largest Metastatic Deposit (millimeters): N/A. 2 of 5 FINAL for Veronica, LATHON Reilly [phone]) Microscopic Comment(continued) Extranodal Extension: N/A. Treatment Effect: No known presurgical therapy Breast Biomarker Testing Performed on Previous Biopsy: Testing Performed on Case Number: [id number] Estrogen Receptor: Positive, 100% strong staining. Progesterone Receptor: Negative. HER2: Negative. Ki-67: 10%. Representative tumor block: 1D, 1E Pathologic Stage Classification (pTNM, AJCC 8th Edition): pT2, pN0 (v4.3.0.0) Vicente Males MD Pathologist, Electronic Signature (Case signed 08/24/2018).  The patient is a 53 year old female.   Allergies Sabino Gasser, CMA; 09/18/2018 10:49 AM) Zocor *ANTIHYPERLIPIDEMICS* Allergies Reconciled  Medication History Sabino Gasser, CMA; 09/18/2018 10:50 AM) hydroCHLOROthiazide (12.'5MG'$  Capsule, Oral) Active. Folic Acid ('1MG'$  Tablet, Oral) Active. Flonase (50MCG/DOSE Inhaler, Nasal) Active. Levothyroxine Sodium (112MCG Tablet, Oral) Active. Symbicort (160-4.5MCG/ACT Aerosol, Inhalation) Active. Singulair ('10MG'$  Tablet, Oral) Active. Vitamin D (1000UNIT Tablet, Oral) Active. NIFEdipine ER Osmotic Release ('60MG'$  Tablet ER 24HR, Oral) Active. Medications Reconciled    Vitals Sabino Gasser CMA; 09/18/2018 10:51 AM) 09/18/2018 10:50 AM Weight: 211.38 lb Height: 66in Body Surface Area: 2.05 m Body Mass Index: 34.12 kg/m  Temp.: 97.51F(Oral)  Pulse: 96 (Regular)  BP: 130/82 (Sitting, Left Arm, Standard)        Physical Exam (Christie Copley A. Nitasha Jewel MD; 09/18/2018 12:27 PM)  Breast Note: Left extra incisions intact. Very small residual seroma admitted. No fluctuance or foul-smelling drainage noted. No erythema noted.    Assessment & Plan (Casmer Yepiz A. Milagro Belmares MD; 09/18/2018 11:05 AM)  POST-OPERATIVE  STATE [phone]) Impression: Spontaneous drainage of left axillary seroma. She feels better. There is no signs of infection or residual seroma today. She is on the schedule for 9 September for port placement. Pt requires port placement for chemotherapy. Risk include bleeding, infection, pneumothorax, hemothorax, mediastinal injury, nerve injury , blood vessel injury, stroke, blood clots, death, migration. embolization and need for additional procedures. Pt agrees to proceed.  Current Plans Pt Education - [employer] Free Text Education/Instructions: discussed with patient and provided information.

## 2018-09-19 NOTE — Progress Notes (Signed)
[place], [place] - [address] AT [facility] [address] [place] [address] Phone: [phone] Fax: [place], [place] - [address] [facility]. [address] [place] [address] Phone: [phone] Fax: [place], [place] AT [address] [place] [address] Phone: [phone] Fax: [phone]  [facility] : [facility] [place], [place] [address] Phone: [phone] Fax: [phone]    Your procedure is scheduled on Wednesday, September 9th.  Report to [facility] Main Entrance "A" at 8:00 A.M., and check in at the Admitting office.  Call this number if you have problems the morning of surgery:  [phone]  Call [phone] if you have any questions prior to your surgery date Monday-Friday 8am-4pm   Remember:  Do not eat after midnight the night before your surgery  You may drink clear liquids until 7:00 the morning of your surgery.   Clear liquids allowed are: Water, Non-Citrus Juices (without pulp), Carbonated Beverages, Clear Tea, Black Coffee Only, and Gatorade    Take these medicines the morning of surgery with A SIP OF WATER  budesonide-formoterol (SYMBICORT)  levocetirizine (XYZAL)  levothyroxine (SYNTHROID, LEVOTHROID) NIFEdipine (PROCARDIA XL/ADALAT-CC)   If needed -albuterol (VENTOLIN HFA)/inaler, EPINEPHrine (EPIPEN 2-PAK), fluticasone (FLONASE) Garrel Ridgel spray, Olopatadine HCl (PAZEO) /eye drops, prochlorperazine (COMPAZINE)  7 days prior to surgery STOP taking any Aspirin (unless otherwise instructed by your surgeon), Aleve, Naproxen, Ibuprofen, Motrin, Advil, Goody's, BC's, all herbal medications, fish oil, and all vitamins.   The Morning of  Surgery  Do not wear jewelry, make-up or nail polish.  Do not wear lotions, powders, or perfumes/colognes, or deodorant  Do not shave 48 hours prior to surgery.  Men may shave face and neck.  Do not bring valuables to the hospital.  [facility] is not responsible for any belongings or valuables.  If you are a smoker, DO NOT Smoke 24 hours prior to surgery IF you wear a CPAP at night please bring your mask, tubing, and machine the morning of surgery   Remember that you must have someone to transport you home after your surgery, and remain with you for 24 hours if you are discharged the same day.  Contacts, glasses, hearing aids, dentures or bridgework may not be worn into surgery.   Leave your suitcase in the car.  After surgery it may be brought to your room.  For patients admitted to the hospital, discharge time will be determined by your treatment team.  Patients discharged the day of surgery will not be allowed to drive home.   Special instructions:   [place]- Preparing For Surgery  Before surgery, you can play an important role. Because skin is not sterile, your skin needs to be as free of germs as possible. You can reduce the number of germs on your skin by washing with CHG (chlorahexidine gluconate) Soap before surgery.  CHG is an antiseptic cleaner which kills germs and bonds with the skin to continue killing germs even after washing.    Oral Hygiene is also important to reduce your risk of infection.  Remember - BRUSH YOUR TEETH THE MORNING OF SURGERY WITH YOUR REGULAR TOOTHPASTE  Please do not use if you have an allergy  to CHG or antibacterial soaps. If your skin becomes reddened/irritated stop using the CHG.  Do not shave (including legs and underarms) for at least 48 hours prior to first CHG shower. It is OK to shave your face.  Please follow these instructions carefully.   1. Shower the NIGHT BEFORE SURGERY and the MORNING OF SURGERY with CHG Soap.   2. If you chose  to wash your hair, wash your hair first as usual with your normal shampoo.  3. After you shampoo, rinse your hair and body thoroughly to remove the shampoo.  4. Use CHG as you would any other liquid soap. You can apply CHG directly to the skin and wash gently with a scrungie or a clean washcloth.   5. Apply the CHG Soap to your body ONLY FROM THE NECK DOWN.  Do not use on open wounds or open sores. Avoid contact with your eyes, ears, mouth and genitals (private parts). Wash Face and genitals (private parts)  with your normal soap.   6. Wash thoroughly, paying special attention to the area where your surgery will be performed.  7. Thoroughly rinse your body with warm water from the neck down.  8. DO NOT shower/wash with your normal soap after using and rinsing off the CHG Soap.  9. Pat yourself dry with a CLEAN TOWEL.  10. Wear CLEAN PAJAMAS to bed the night before surgery, wear comfortable clothes the morning of surgery  11. Place CLEAN SHEETS on your bed the night of your first shower and DO NOT SLEEP WITH PETS.  Day of Surgery: Do not apply any deodorants/lotions. Please shower the morning of surgery with the CHG soap  Please wear clean clothes to the hospital/surgery center.   Remember to brush your teeth WITH YOUR REGULAR TOOTHPASTE.  Please read over the following fact sheets that you were given.

## 2018-09-20 ENCOUNTER — Encounter: Payer: Self-pay | Admitting: *Deleted

## 2018-09-20 ENCOUNTER — Encounter (HOSPITAL_COMMUNITY)
Admission: RE | Admit: 2018-09-20 | Discharge: 2018-09-20 | Disposition: A | Discharge status: Home / Self Care | Payer: BC Managed Care – PPO | Source: Ambulatory Visit | Attending: Surgery | Admitting: Surgery

## 2018-09-20 ENCOUNTER — Other Ambulatory Visit: Payer: Self-pay

## 2018-09-20 ENCOUNTER — Encounter (HOSPITAL_COMMUNITY): Payer: Self-pay

## 2018-09-20 DIAGNOSIS — Z01812 Encounter for preprocedural laboratory examination: Secondary | ICD-10-CM | POA: Insufficient documentation

## 2018-09-20 LAB — BASIC METABOLIC PANEL
Anion gap: 11 (ref 5–15)
BUN: 14 mg/dL (ref 6–20)
CO2: 24 mmol/L (ref 22–32)
Calcium: 9.3 mg/dL (ref 8.9–10.3)
Chloride: 105 mmol/L (ref 98–111)
Creatinine, Ser: 0.95 mg/dL (ref 0.44–1.00)
GFR calc Af Amer: >60 mL/min (ref >60)
GFR calc non Af Amer: >60 mL/min (ref >60)
Glucose, Bld: 99 mg/dL (ref 70–99)
Potassium: 3.6 mmol/L (ref 3.5–5.1)
Sodium: 140 mmol/L (ref 135–145)

## 2018-09-20 LAB — CBC
HCT: 39.9 % (ref 36.0–46.0)
Hemoglobin: 12.9 g/dL (ref 12.0–15.0)
MCH: 26.2 pg (ref 26.0–34.0)
MCHC: 32.3 g/dL (ref 30.0–36.0)
MCV: 80.9 fL (ref 80.0–100.0)
Platelets: 304 10*3/uL (ref 150–400)
RBC: 4.93 MIL/uL (ref 3.87–5.11)
RDW: 13.3 % (ref 11.5–15.5)
WBC: 8 10*3/uL (ref 4.0–10.5)
nRBC: 0 % (ref 0.0–0.2)

## 2018-09-20 NOTE — Progress Notes (Signed)
PCP: Dr. Moses Manners Cardiologist: denies--reports she saw one once, in 2009 for reproductive health work up, unable to remember MD's name  EKG: 08/18/2018 CXR: n/a ECHO: n/a Stress Test: reports she walked on a treadmill as a work up for fertility MD, says it was here in [place], but unable to find any results in Care Everywhere Cardiac Cath: denies  Patient denies shortness of breath, fever, cough, and chest pain at PAT appointment.  Patient verbalized understanding of instructions provided today at the PAT appointment.  Patient asked to review instructions at home and day of surgery.

## 2018-09-21 NOTE — Anesthesia Preprocedure Evaluation (Addendum)
Anesthesia Evaluation  Patient identified by MRN, date of birth, ID band Patient awake    Reviewed: Allergy & Precautions, NPO status , Patient's Chart, lab work & pertinent test results  Airway Mallampati: II  TM Distance: >3 FB Neck ROM: Full    Dental  (+) Missing,    Pulmonary asthma ,    Pulmonary exam normal breath sounds clear to auscultation       Cardiovascular hypertension, Normal cardiovascular exam Rhythm:Regular Rate:Normal     Neuro/Psych negative neurological ROS  negative psych ROS   GI/Hepatic Neg liver ROS, GERD  Medicated and Controlled,  Endo/Other  Hypothyroidism   Renal/GU negative Renal ROS     Musculoskeletal negative musculoskeletal ROS (+)   Abdominal (+) + obese,   Peds  Hematology negative hematology ROS (+)   Anesthesia Other Findings BREAST CANCER  Reproductive/Obstetrics                           Anesthesia Physical Anesthesia Plan  ASA: III  Anesthesia Plan: General   Post-op Pain Management:    Induction: Intravenous  PONV Risk Score and Plan: 3 and Ondansetron, Dexamethasone, Midazolam and Treatment may vary due to age or medical condition  Airway Management Planned: LMA  Additional Equipment:   Intra-op Plan:   Post-operative Plan: Extubation in OR  Informed Consent: I have reviewed the patients History and Physical, chart, labs and discussed the procedure including the risks, benefits and alternatives for the proposed anesthesia with the patient or authorized representative who has indicated his/her understanding and acceptance.     Dental advisory given  Plan Discussed with: CRNA  Anesthesia Plan Comments: (Per PA-C: Hx of heart murmur. Pt reports it has been previously worked up and told it was benign (unclear where workup happened, pt says she had normal stress test years ago but unsure where). Some providers have reported murmur while  others have not. She reports walking 30 minutes several days per week, denies any CV symptoms. Recently underwent Left breast lumpectomy on 08/22/18 without issue. )      Anesthesia Quick Evaluation

## 2018-09-23 ENCOUNTER — Other Ambulatory Visit (HOSPITAL_COMMUNITY)
Admission: RE | Admit: 2018-09-23 | Discharge: 2018-09-23 | Disposition: A | Discharge status: Home / Self Care | Payer: BC Managed Care – PPO | Source: Ambulatory Visit | Attending: Surgery | Admitting: Surgery

## 2018-09-23 DIAGNOSIS — Z20828 Contact with and (suspected) exposure to other viral communicable diseases: Secondary | ICD-10-CM | POA: Insufficient documentation

## 2018-09-23 DIAGNOSIS — C50919 Malignant neoplasm of unspecified site of unspecified female breast: Secondary | ICD-10-CM | POA: Insufficient documentation

## 2018-09-23 DIAGNOSIS — Z01812 Encounter for preprocedural laboratory examination: Secondary | ICD-10-CM | POA: Diagnosis not present

## 2018-09-24 LAB — NOVEL CORONAVIRUS, NAA (HOSP ORDER, SEND-OUT TO REF LAB; TAT 18-24 HRS): SARS-CoV-2, NAA: NOT DETECTED

## 2018-09-25 ENCOUNTER — Encounter: Payer: Self-pay | Admitting: Oncology

## 2018-09-26 ENCOUNTER — Encounter: Payer: Self-pay | Admitting: Adult Health

## 2018-09-26 MED ORDER — CEFAZOLIN SODIUM-DEXTROSE 2-4 GM/100ML-% IV SOLN
2.0000 g | INTRAVENOUS | Status: AC
  Administered 2018-09-27: 2 g via INTRAVENOUS
  Filled 2018-09-26: qty 100

## 2018-09-27 ENCOUNTER — Other Ambulatory Visit: Payer: Self-pay

## 2018-09-27 ENCOUNTER — Encounter (HOSPITAL_COMMUNITY)
Admission: RE | Disposition: A | Discharge status: Home / Self Care | Payer: Self-pay | Source: Home / Self Care | Attending: Surgery

## 2018-09-27 ENCOUNTER — Ambulatory Visit (HOSPITAL_COMMUNITY): Payer: BC Managed Care – PPO | Admitting: Physician Assistant

## 2018-09-27 ENCOUNTER — Ambulatory Visit (HOSPITAL_COMMUNITY): Payer: BC Managed Care – PPO

## 2018-09-27 ENCOUNTER — Ambulatory Visit (HOSPITAL_COMMUNITY): Payer: BC Managed Care – PPO | Admitting: Anesthesiology

## 2018-09-27 ENCOUNTER — Encounter (HOSPITAL_COMMUNITY): Payer: Self-pay

## 2018-09-27 ENCOUNTER — Telehealth: Payer: Self-pay | Admitting: *Deleted

## 2018-09-27 ENCOUNTER — Ambulatory Visit (HOSPITAL_COMMUNITY)
Admission: RE | Admit: 2018-09-27 | Discharge: 2018-09-27 | Disposition: A | Discharge status: Home / Self Care | Payer: BC Managed Care – PPO | Attending: Surgery | Admitting: Surgery

## 2018-09-27 DIAGNOSIS — I10 Essential (primary) hypertension: Secondary | ICD-10-CM | POA: Diagnosis not present

## 2018-09-27 DIAGNOSIS — C50912 Malignant neoplasm of unspecified site of left female breast: Secondary | ICD-10-CM | POA: Insufficient documentation

## 2018-09-27 DIAGNOSIS — Z79899 Other long term (current) drug therapy: Secondary | ICD-10-CM | POA: Insufficient documentation

## 2018-09-27 DIAGNOSIS — Z7951 Long term (current) use of inhaled steroids: Secondary | ICD-10-CM | POA: Insufficient documentation

## 2018-09-27 DIAGNOSIS — Z95828 Presence of other vascular implants and grafts: Secondary | ICD-10-CM

## 2018-09-27 DIAGNOSIS — Z79890 Hormone replacement therapy (postmenopausal): Secondary | ICD-10-CM | POA: Insufficient documentation

## 2018-09-27 DIAGNOSIS — E039 Hypothyroidism, unspecified: Secondary | ICD-10-CM | POA: Insufficient documentation

## 2018-09-27 DIAGNOSIS — J45909 Unspecified asthma, uncomplicated: Secondary | ICD-10-CM | POA: Insufficient documentation

## 2018-09-27 DIAGNOSIS — K219 Gastro-esophageal reflux disease without esophagitis: Secondary | ICD-10-CM | POA: Diagnosis not present

## 2018-09-27 DIAGNOSIS — Z419 Encounter for procedure for purposes other than remedying health state, unspecified: Secondary | ICD-10-CM

## 2018-09-27 HISTORY — PX: PORTACATH PLACEMENT: SHX2246

## 2018-09-27 LAB — POCT PREGNANCY, URINE: Preg Test, Ur: NEGATIVE

## 2018-09-27 SURGERY — INSERTION, TUNNELED CENTRAL VENOUS DEVICE, WITH PORT
Anesthesia: General | Site: Chest | Laterality: Right

## 2018-09-27 MED ORDER — DEXAMETHASONE SODIUM PHOSPHATE 10 MG/ML IJ SOLN
INTRAMUSCULAR | Status: DC | PRN
  Administered 2018-09-27: 10 mg via INTRAVENOUS

## 2018-09-27 MED ORDER — FENTANYL CITRATE (PF) 250 MCG/5ML IJ SOLN
INTRAMUSCULAR | Status: AC
  Filled 2018-09-27: qty 5

## 2018-09-27 MED ORDER — IBUPROFEN 800 MG PO TABS: 800.0000 mg | ORAL_TABLET | Freq: Three times a day (TID) | ORAL | 0 refills | Status: DC | PRN

## 2018-09-27 MED ORDER — PROMETHAZINE HCL 25 MG/ML IJ SOLN: 6.2500 mg | INTRAMUSCULAR | Status: DC | PRN

## 2018-09-27 MED ORDER — ACETAMINOPHEN 500 MG PO TABS
1000.0000 mg | ORAL_TABLET | Freq: Once | ORAL | Status: AC
  Administered 2018-09-27: 1000 mg via ORAL
  Filled 2018-09-27: qty 2

## 2018-09-27 MED ORDER — 0.9 % SODIUM CHLORIDE (POUR BTL) OPTIME
TOPICAL | Status: DC | PRN
  Administered 2018-09-27: 08:00:00 1000 mL

## 2018-09-27 MED ORDER — MEPERIDINE HCL 25 MG/ML IJ SOLN: 6.2500 mg | INTRAMUSCULAR | Status: DC | PRN

## 2018-09-27 MED ORDER — HYDROMORPHONE HCL 1 MG/ML IJ SOLN: 0.2500 mg | INTRAMUSCULAR | Status: DC | PRN

## 2018-09-27 MED ORDER — BUPIVACAINE-EPINEPHRINE 0.25% -1:200000 IJ SOLN
INTRAMUSCULAR | Status: DC | PRN
  Administered 2018-09-27: 17 mL

## 2018-09-27 MED ORDER — SODIUM CHLORIDE 0.9 % IV SOLN
INTRAVENOUS | Status: AC
  Filled 2018-09-27: qty 1.2

## 2018-09-27 MED ORDER — BUPIVACAINE-EPINEPHRINE (PF) 0.25% -1:200000 IJ SOLN
INTRAMUSCULAR | Status: AC
  Filled 2018-09-27: qty 30

## 2018-09-27 MED ORDER — ONDANSETRON HCL 4 MG/2ML IJ SOLN: 4.0000 mg | Freq: Once | INTRAMUSCULAR | Status: DC | PRN

## 2018-09-27 MED ORDER — OXYCODONE HCL 5 MG/5ML PO SOLN: 5.0000 mg | Freq: Once | ORAL | Status: DC | PRN

## 2018-09-27 MED ORDER — SODIUM CHLORIDE 0.9 % IV SOLN
INTRAVENOUS | Status: DC | PRN
  Administered 2018-09-27: 500 mL

## 2018-09-27 MED ORDER — KETOROLAC TROMETHAMINE 30 MG/ML IJ SOLN: 30.0000 mg | Freq: Once | INTRAMUSCULAR | Status: DC | PRN

## 2018-09-27 MED ORDER — HEPARIN SOD (PORK) LOCK FLUSH 100 UNIT/ML IV SOLN
INTRAVENOUS | Status: AC
  Filled 2018-09-27: qty 5

## 2018-09-27 MED ORDER — ONDANSETRON HCL 4 MG/2ML IJ SOLN
INTRAMUSCULAR | Status: DC | PRN
  Administered 2018-09-27: 4 mg via INTRAVENOUS

## 2018-09-27 MED ORDER — MIDAZOLAM HCL 2 MG/2ML IJ SOLN
INTRAMUSCULAR | Status: AC
  Filled 2018-09-27: qty 2

## 2018-09-27 MED ORDER — STERILE WATER FOR IRRIGATION IR SOLN
Status: DC | PRN
  Administered 2018-09-27: 1000 mL

## 2018-09-27 MED ORDER — FENTANYL CITRATE (PF) 250 MCG/5ML IJ SOLN
INTRAMUSCULAR | Status: DC | PRN
  Administered 2018-09-27 (×3): 25 ug via INTRAVENOUS

## 2018-09-27 MED ORDER — HEPARIN SOD (PORK) LOCK FLUSH 100 UNIT/ML IV SOLN
INTRAVENOUS | Status: DC | PRN
  Administered 2018-09-27: 500 [IU]

## 2018-09-27 MED ORDER — OXYCODONE HCL 5 MG PO TABS: 5.0000 mg | ORAL_TABLET | Freq: Once | ORAL | Status: DC | PRN

## 2018-09-27 MED ORDER — OXYCODONE HCL 5 MG PO TABS: 5.0000 mg | ORAL_TABLET | Freq: Four times a day (QID) | ORAL | 0 refills | Status: DC | PRN

## 2018-09-27 MED ORDER — PROPOFOL 10 MG/ML IV BOLUS
INTRAVENOUS | Status: DC | PRN
  Administered 2018-09-27: 200 mg via INTRAVENOUS

## 2018-09-27 MED ORDER — LACTATED RINGERS IV SOLN
INTRAVENOUS | Status: DC | PRN
  Administered 2018-09-27: 08:00:00 via INTRAVENOUS

## 2018-09-27 MED ORDER — LIDOCAINE 2% (20 MG/ML) 5 ML SYRINGE
INTRAMUSCULAR | Status: DC | PRN
  Administered 2018-09-27: 60 mg via INTRAVENOUS

## 2018-09-27 MED ORDER — MIDAZOLAM HCL 5 MG/5ML IJ SOLN
INTRAMUSCULAR | Status: DC | PRN
  Administered 2018-09-27: 2 mg via INTRAVENOUS

## 2018-09-27 MED ORDER — PROPOFOL 10 MG/ML IV BOLUS
INTRAVENOUS | Status: AC
  Filled 2018-09-27: qty 20

## 2018-09-27 SURGICAL SUPPLY — 39 items
ADH SKN CLS APL DERMABOND .7 (GAUZE/BANDAGES/DRESSINGS) ×1
BAG DECANTER FOR FLEXI CONT (MISCELLANEOUS) ×3 IMPLANT
COVER SURGICAL LIGHT HANDLE (MISCELLANEOUS) ×3 IMPLANT
COVER TRANSDUCER ULTRASND GEL (DRAPE) ×3 IMPLANT
DERMABOND ADVANCED (GAUZE/BANDAGES/DRESSINGS) ×2
DERMABOND ADVANCED .7 DNX12 (GAUZE/BANDAGES/DRESSINGS) ×1 IMPLANT
DRAPE C-ARM 42X72 X-RAY (DRAPES) ×3 IMPLANT
ELECT CAUTERY BLADE 6.4 (BLADE) ×3 IMPLANT
ELECT REM PT RETURN 9FT ADLT (ELECTROSURGICAL) ×3
ELECTRODE REM PT RTRN 9FT ADLT (ELECTROSURGICAL) ×1 IMPLANT
GAUZE 4X4 16PLY RFD (DISPOSABLE) ×3 IMPLANT
GEL ULTRASOUND 20GR AQUASONIC (MISCELLANEOUS) ×2 IMPLANT
GLOVE BIOGEL PI IND STRL 8 (GLOVE) ×1 IMPLANT
GLOVE BIOGEL PI INDICATOR 8 (GLOVE) ×2
GOWN STRL REUS W/ TWL LRG LVL3 (GOWN DISPOSABLE) ×1 IMPLANT
GOWN STRL REUS W/ TWL XL LVL3 (GOWN DISPOSABLE) ×1 IMPLANT
GOWN STRL REUS W/TWL LRG LVL3 (GOWN DISPOSABLE) ×3
GOWN STRL REUS W/TWL XL LVL3 (GOWN DISPOSABLE) ×3
INTRODUCER COOK 11FR (CATHETERS) IMPLANT
KIT BASIN OR (CUSTOM PROCEDURE TRAY) ×3 IMPLANT
KIT PORT POWER 8FR ISP CVUE (Port) ×2 IMPLANT
KIT TURNOVER KIT B (KITS) ×3 IMPLANT
NS IRRIG 1000ML POUR BTL (IV SOLUTION) ×3 IMPLANT
PAD ARMBOARD 7.5X6 YLW CONV (MISCELLANEOUS) ×3 IMPLANT
PENCIL BUTTON HOLSTER BLD 10FT (ELECTRODE) ×3 IMPLANT
POSITIONER HEAD DONUT 9IN (MISCELLANEOUS) ×3 IMPLANT
SET INTRODUCER 12FR PACEMAKER (INTRODUCER) IMPLANT
SET SHEATH INTRODUCER 10FR (MISCELLANEOUS) IMPLANT
SHEATH COOK PEEL AWAY SET 9F (SHEATH) IMPLANT
SOAP 2 % CHG 4 OZ (WOUND CARE) ×2 IMPLANT
SUT MNCRL AB 4-0 PS2 18 (SUTURE) ×3 IMPLANT
SUT PROLENE 2 0 SH 30 (SUTURE) ×3 IMPLANT
SUT VIC AB 3-0 SH 27 (SUTURE) ×3
SUT VIC AB 3-0 SH 27X BRD (SUTURE) ×1 IMPLANT
SUT VIC AB 3-0 SH 8-18 (SUTURE) ×2 IMPLANT
SYR 5ML LUER SLIP (SYRINGE) ×3 IMPLANT
TOWEL GREEN STERILE (TOWEL DISPOSABLE) ×3 IMPLANT
TOWEL GREEN STERILE FF (TOWEL DISPOSABLE) ×3 IMPLANT
TRAY LAPAROSCOPIC MC (CUSTOM PROCEDURE TRAY) ×3 IMPLANT

## 2018-09-27 NOTE — Telephone Encounter (Signed)
Patient has not received her Dignicap yet. Per Threasa Beards we have an extra one that she can use and she will reimburse when she gets hers. She will keep appointment on 9/11.

## 2018-09-27 NOTE — Discharge Instructions (Signed)
    PORT-A-CATH: POST OP INSTRUCTIONS  Always review your discharge instruction sheet given to you by the facility where your surgery was performed.   1. A prescription for pain medication may be given to you upon discharge. Take your pain medication as prescribed, if needed. If narcotic pain medicine is not needed, then you make take acetaminophen (Tylenol) or ibuprofen (Advil) as needed.  2. Take your usually prescribed medications unless otherwise directed. 3. If you need a refill on your pain medication, please contact our office. All narcotic pain medicine now requires a paper prescription.  Phoned in and fax refills are no longer allowed by law.  Prescriptions will not be filled after 5 pm or on weekends.  4. You should follow a light diet for the remainder of the day after your procedure. 5. Most patients will experience some mild swelling and/or bruising in the area of the incision. It may take several days to resolve. 6. It is common to experience some constipation if taking pain medication after surgery. Increasing fluid intake and taking a stool softener (such as Colace) will usually help or prevent this problem from occurring. A mild laxative (Milk of Magnesia or Miralax) should be taken according to package directions if there are no bowel movements after 48 hours.  7. Unless discharge instructions indicate otherwise, you may remove your bandages 48 hours after surgery, and you may shower at that time. You may have steri-strips (small white skin tapes) in place directly over the incision.  These strips should be left on the skin for 7-10 days.  If your surgeon used Dermabond (skin glue) on the incision, you may shower in 24 hours.  The glue will flake off over the next 2-3 weeks.  8. If your port is left accessed at the end of surgery (needle left in port), the dressing cannot get wet and should only by changed by a healthcare professional. When the port is no longer accessed (when the  needle has been removed), follow step 7.   9. ACTIVITIES:  Limit activity involving your arms for the next 72 hours. Do no strenuous exercise or activity for 1 week. You may drive when you are no longer taking prescription pain medication, you can comfortably wear a seatbelt, and you can maneuver your car. 10.You may need to see your doctor in the office for a follow-up appointment.  Please       check with your doctor.  11.When you receive a new Port-a-Cath, you will get a product guide and        ID card.  Please keep them in case you need them.  WHEN TO CALL YOUR DOCTOR ([phone]): 1. Fever over 101.0 2. Chills 3. Continued bleeding from incision 4. Increased redness and tenderness at the site 5. Shortness of breath, difficulty breathing   The clinic staff is available to answer your questions during regular business hours. Please don't hesitate to call and ask to speak to one of the nurses or medical assistants for clinical concerns. If you have a medical emergency, go to the nearest emergency room or call 911.  A surgeon from [facility] is always on call at the hospital.     For further information, please visit [email or web address]      

## 2018-09-27 NOTE — Anesthesia Postprocedure Evaluation (Signed)
Anesthesia Post Note  Patient: Veronica Reilly  Procedure(s) Performed: INSERTION PORT-A-CATH WITH ULTRASOUND (Right Chest)     Patient location during evaluation: PACU Anesthesia Type: General Level of consciousness: awake and alert Pain management: pain level controlled Vital Signs Assessment: post-procedure vital signs reviewed and stable Respiratory status: spontaneous breathing, nonlabored ventilation, respiratory function stable and patient connected to nasal cannula oxygen Cardiovascular status: blood pressure returned to baseline and stable Postop Assessment: no apparent nausea or vomiting Anesthetic complications: no    Last Vitals:  Vitals:   09/27/18 0943 09/27/18 0945  BP: 121/77   Pulse: 79 79  Resp: 17 18  Temp:    SpO2: 98% 99%    Last Pain:  Vitals:   09/27/18 0943  PainSc: 0-No pain                 Ryan P Ellender

## 2018-09-27 NOTE — Anesthesia Procedure Notes (Signed)
Procedure Name: LMA Insertion Date/Time: 09/27/2018 7:34 AM Performed by: Cleda Daub, CRNA Pre-anesthesia Checklist: Patient identified, Emergency Drugs available, Suction available and Patient being monitored Patient Re-evaluated:Patient Re-evaluated prior to induction Oxygen Delivery Method: Circle system utilized Preoxygenation: Pre-oxygenation with 100% oxygen Induction Type: IV induction LMA: LMA inserted LMA Size: 4.0 Number of attempts: 1 Placement Confirmation: positive ETCO2 Tube secured with: Tape Dental Injury: Teeth and Oropharynx as per pre-operative assessment

## 2018-09-27 NOTE — Interval H&P Note (Signed)
History and Physical Interval Note:  09/27/2018 8:10 AM  Veronica Reilly  has presented today for surgery, with the diagnosis of BREAST CANCER.  The various methods of treatment have been discussed with the patient and family. After consideration of risks, benefits and other options for treatment, the patient has consented to  Procedure(s): INSERTION PORT-A-CATH WITH ULTRASOUND (N/A) as a surgical intervention.  The patient's history has been reviewed, patient examined, no change in status, stable for surgery.  I have reviewed the patient's chart and labs.  Questions were answered to the patient's satisfaction.   Risk of bleeding infection collapse lung heart injury mediastinal injury catheter migration/ malfunction/embolization DVT/clot  Marcello Moores A Jamion Carter

## 2018-09-27 NOTE — Op Note (Signed)
Preoperative diagnosis: PAC needed for venous access   Postoperative diagnosis: Same  Procedure: Portacath Placement with U/S and C arm guidance   Surgeon: Turner Daniels, MD, FACS  Anesthesia: General and 0.25 % marcaine with epinephrine  Clinical History and Indications: The patient is getting ready to begin chemotherapy for her cancer. She  needs a Port-A-Cath for venous access. Risk of bleeding, infection,  Collapse lung,  Death,  DVT,  Organ injury,  Mediastinal injury,  Injury to heart,  Injury to blood vessels,  Nerves,  Migration of catheter,  Embolization of catheter and the need for more surgery.  Description of Procedure: I have seen the patient in the holding area and confirmed the plans for the procedure as noted above. I reviewed the risks and complications again and the patient has no further questions. She wishes to proceed.   The patient was then taken to the operating room. After satisfactory general  anesthesia had been obtained the upper chest and lower neck were prepped and draped as a sterile field. The timeout was done.  The right internal jugular vein  was entered under U/S guidance  and the guidewire threaded into the superior vena cava right atrial area under fluoroscopic guidance. An incision was then made on the anterior chest wall and a subcutaneous pocket fashioned for the port reservoir.  The port tubing was then brought through a subcutaneous tunnel from the port site to the guidewire site.  The port and catheter were attached, locked  and flushed. The catheter was measured and cut to appropriate length.The dilator and peel-away sheath were then advanced over the guidewire while monitoring this with fluoroscopy. The guidewire and dilator were removed and the tubing threaded to approximately 21 cm. The peel-away sheath was then removed. The catheter aspirated and flushed easily. Using fluoroscopy the tip was in the superior vena cava right atrial junction area. It  aspirated and flushed easily. That aspirated and flushed easily.  The reservoir was secured to the fascia with 1 sutures of 2-0 Prolene. A final check with fluoroscopy was done to make sure we had no kinks and good positioning of the tip of the catheter. Everything appeared to be okay. The catheter was aspirated, flushed with dilute heparin and then concentrated aqueous heparin.  The incision was then closed with interrupted 3-0 Vicryl, and 4-0 Monocryl subcuticular with Dermabond on the skin.  There were no operative complications. Estimated blood loss was minimal. All counts were correct. The patient tolerated the procedure well.  Turner Daniels, MD, FACS

## 2018-09-27 NOTE — Transfer of Care (Signed)
Immediate Anesthesia Transfer of Care Note  Patient: Veronica Reilly  Procedure(s) Performed: INSERTION PORT-A-CATH WITH ULTRASOUND (Right Chest)  Patient Location: PACU  Anesthesia Type:General  Level of Consciousness: awake, alert  and oriented  Airway & Oxygen Therapy: Patient Spontanous Breathing  Post-op Assessment: Report given to RN and Post -op Vital signs reviewed and stable  Post vital signs: Reviewed and stable  Last Vitals:  Vitals Value Taken Time  BP    Temp    Pulse 92 09/27/18 0926  Resp 16 09/27/18 0926  SpO2 98 % 09/27/18 0926  Vitals shown include unvalidated device data.  Last Pain:  Vitals:   09/27/18 0722  PainSc: 0-No pain         Complications: No apparent anesthesia complications

## 2018-09-28 ENCOUNTER — Encounter (HOSPITAL_COMMUNITY): Payer: Self-pay | Admitting: Surgery

## 2018-09-28 ENCOUNTER — Other Ambulatory Visit: Payer: Self-pay | Admitting: *Deleted

## 2018-09-28 ENCOUNTER — Encounter: Payer: Self-pay | Admitting: Adult Health

## 2018-09-28 DIAGNOSIS — C50412 Malignant neoplasm of upper-outer quadrant of left female breast: Secondary | ICD-10-CM

## 2018-09-29 ENCOUNTER — Encounter: Payer: Self-pay | Admitting: Oncology

## 2018-09-29 ENCOUNTER — Inpatient Hospital Stay: Payer: BC Managed Care – PPO | Admitting: Adult Health

## 2018-09-29 ENCOUNTER — Encounter: Payer: Self-pay | Admitting: Adult Health

## 2018-09-29 ENCOUNTER — Other Ambulatory Visit: Payer: Self-pay

## 2018-09-29 ENCOUNTER — Inpatient Hospital Stay: Payer: BC Managed Care – PPO

## 2018-09-29 ENCOUNTER — Inpatient Hospital Stay: Payer: BC Managed Care – PPO | Attending: Oncology

## 2018-09-29 ENCOUNTER — Telehealth: Payer: Self-pay | Admitting: *Deleted

## 2018-09-29 VITALS — BP 136/100 | HR 81 | Temp 97.9°F | Resp 18

## 2018-09-29 VITALS — BP 129/88 | HR 88 | Temp 98.2°F | Resp 18 | Ht 66.0 in | Wt 214.9 lb

## 2018-09-29 DIAGNOSIS — Z8042 Family history of malignant neoplasm of prostate: Secondary | ICD-10-CM | POA: Diagnosis not present

## 2018-09-29 DIAGNOSIS — Z170 Estrogen receptor positive status [ER+]: Secondary | ICD-10-CM | POA: Insufficient documentation

## 2018-09-29 DIAGNOSIS — Z5111 Encounter for antineoplastic chemotherapy: Secondary | ICD-10-CM | POA: Insufficient documentation

## 2018-09-29 DIAGNOSIS — Z801 Family history of malignant neoplasm of trachea, bronchus and lung: Secondary | ICD-10-CM | POA: Insufficient documentation

## 2018-09-29 DIAGNOSIS — E039 Hypothyroidism, unspecified: Secondary | ICD-10-CM | POA: Diagnosis not present

## 2018-09-29 DIAGNOSIS — C50412 Malignant neoplasm of upper-outer quadrant of left female breast: Secondary | ICD-10-CM

## 2018-09-29 DIAGNOSIS — I10 Essential (primary) hypertension: Secondary | ICD-10-CM | POA: Insufficient documentation

## 2018-09-29 DIAGNOSIS — Z7951 Long term (current) use of inhaled steroids: Secondary | ICD-10-CM | POA: Diagnosis not present

## 2018-09-29 DIAGNOSIS — Z95828 Presence of other vascular implants and grafts: Secondary | ICD-10-CM

## 2018-09-29 DIAGNOSIS — Z79899 Other long term (current) drug therapy: Secondary | ICD-10-CM | POA: Diagnosis not present

## 2018-09-29 DIAGNOSIS — Z5189 Encounter for other specified aftercare: Secondary | ICD-10-CM | POA: Insufficient documentation

## 2018-09-29 DIAGNOSIS — J45909 Unspecified asthma, uncomplicated: Secondary | ICD-10-CM | POA: Diagnosis not present

## 2018-09-29 DIAGNOSIS — Z803 Family history of malignant neoplasm of breast: Secondary | ICD-10-CM | POA: Insufficient documentation

## 2018-09-29 DIAGNOSIS — Z791 Long term (current) use of non-steroidal anti-inflammatories (NSAID): Secondary | ICD-10-CM | POA: Diagnosis not present

## 2018-09-29 LAB — CMP (CANCER CENTER ONLY)
ALT: 10 U/L (ref 0–44)
AST: 8 U/L — ABNORMAL LOW (ref 15–41)
Albumin: 3.9 g/dL (ref 3.5–5.0)
Alkaline Phosphatase: 73 U/L (ref 38–126)
Anion gap: 9 (ref 5–15)
BUN: 24 mg/dL — ABNORMAL HIGH (ref 6–20)
CO2: 27 mmol/L (ref 22–32)
Calcium: 9.7 mg/dL (ref 8.9–10.3)
Chloride: 106 mmol/L (ref 98–111)
Creatinine: 0.91 mg/dL (ref 0.44–1.00)
GFR, Est AFR Am: >60 mL/min (ref >60)
GFR, Estimated: >60 mL/min (ref >60)
Glucose, Bld: 104 mg/dL — ABNORMAL HIGH (ref 70–99)
Potassium: 3.7 mmol/L (ref 3.5–5.1)
Sodium: 142 mmol/L (ref 135–145)
Total Bilirubin: 0.4 mg/dL (ref 0.3–1.2)
Total Protein: 7.7 g/dL (ref 6.5–8.1)

## 2018-09-29 LAB — CBC WITH DIFFERENTIAL (CANCER CENTER ONLY)
Abs Immature Granulocytes: 0.13 10*3/uL — ABNORMAL HIGH (ref 0.00–0.07)
Basophils Absolute: 0.1 10*3/uL (ref 0.0–0.1)
Basophils Relative: 0 %
Eosinophils Absolute: 0 10*3/uL (ref 0.0–0.5)
Eosinophils Relative: 0 %
HCT: 38.4 % (ref 36.0–46.0)
Hemoglobin: 12.5 g/dL (ref 12.0–15.0)
Immature Granulocytes: 1 %
Lymphocytes Relative: 14 %
Lymphs Abs: 2.2 10*3/uL (ref 0.7–4.0)
MCH: 25.7 pg — ABNORMAL LOW (ref 26.0–34.0)
MCHC: 32.6 g/dL (ref 30.0–36.0)
MCV: 79 fL — ABNORMAL LOW (ref 80.0–100.0)
Monocytes Absolute: 1.1 10*3/uL — ABNORMAL HIGH (ref 0.1–1.0)
Monocytes Relative: 7 %
Neutro Abs: 12.7 10*3/uL — ABNORMAL HIGH (ref 1.7–7.7)
Neutrophils Relative %: 78 %
Platelet Count: 292 10*3/uL (ref 150–400)
RBC: 4.86 MIL/uL (ref 3.87–5.11)
RDW: 13.8 % (ref 11.5–15.5)
WBC Count: 16.2 10*3/uL — ABNORMAL HIGH (ref 4.0–10.5)
nRBC: 0 % (ref 0.0–0.2)

## 2018-09-29 MED ORDER — SODIUM CHLORIDE 0.9 % IV SOLN
Freq: Once | INTRAVENOUS | Status: AC
  Administered 2018-09-29: 10:00:00 via INTRAVENOUS
  Filled 2018-09-29: qty 250

## 2018-09-29 MED ORDER — HEPARIN SOD (PORK) LOCK FLUSH 100 UNIT/ML IV SOLN
500.0000 [IU] | Freq: Once | INTRAVENOUS | Status: AC | PRN
  Administered 2018-09-29: 500 [IU]
  Filled 2018-09-29: qty 5

## 2018-09-29 MED ORDER — DEXAMETHASONE SODIUM PHOSPHATE 10 MG/ML IJ SOLN
10.0000 mg | Freq: Once | INTRAMUSCULAR | Status: AC
  Administered 2018-09-29: 10:00:00 10 mg via INTRAVENOUS

## 2018-09-29 MED ORDER — DEXAMETHASONE SODIUM PHOSPHATE 10 MG/ML IJ SOLN
INTRAMUSCULAR | Status: AC
  Filled 2018-09-29: qty 1

## 2018-09-29 MED ORDER — PALONOSETRON HCL INJECTION 0.25 MG/5ML
INTRAVENOUS | Status: AC
  Filled 2018-09-29: qty 5

## 2018-09-29 MED ORDER — HEPARIN SOD (PORK) LOCK FLUSH 100 UNIT/ML IV SOLN
500.0000 [IU] | Freq: Once | INTRAVENOUS | Status: DC | PRN
  Filled 2018-09-29: qty 5

## 2018-09-29 MED ORDER — SODIUM CHLORIDE 0.9% FLUSH
10.0000 mL | INTRAVENOUS | Status: DC | PRN
  Administered 2018-09-29: 10 mL
  Filled 2018-09-29: qty 10

## 2018-09-29 MED ORDER — PALONOSETRON HCL INJECTION 0.25 MG/5ML
0.2500 mg | Freq: Once | INTRAVENOUS | Status: AC
  Administered 2018-09-29: 0.25 mg via INTRAVENOUS

## 2018-09-29 MED ORDER — SODIUM CHLORIDE 0.9 % IV SOLN
600.0000 mg/m2 | Freq: Once | INTRAVENOUS | Status: AC
  Administered 2018-09-29: 13:00:00 1260 mg via INTRAVENOUS
  Filled 2018-09-29: qty 63

## 2018-09-29 MED ORDER — SODIUM CHLORIDE 0.9% FLUSH
10.0000 mL | INTRAVENOUS | Status: DC | PRN
  Administered 2018-09-29: 09:00:00 10 mL
  Filled 2018-09-29: qty 10

## 2018-09-29 MED ORDER — SODIUM CHLORIDE 0.9% FLUSH
10.0000 mL | INTRAVENOUS | Status: DC | PRN
  Filled 2018-09-29: qty 10

## 2018-09-29 MED ORDER — SODIUM CHLORIDE 0.9 % IV SOLN
75.0000 mg/m2 | Freq: Once | INTRAVENOUS | Status: AC
  Administered 2018-09-29: 160 mg via INTRAVENOUS
  Filled 2018-09-29: qty 16

## 2018-09-29 NOTE — Progress Notes (Signed)
[place]  [phone] [phone]     ID: Veronica Reilly DOB: [birth year]  MR#: [id number]  LNL#:[id number]  Patient Care Team: Leeroy Cha, MD as PCP - General (Internal Medicine) Magrinat, Virgie Dad, MD as Consulting Physician (Oncology) Kyung Rudd, MD as Consulting Physician (Radiation Oncology) Erroll Luna, MD as Consulting Physician (General Surgery) Delsa Bern, MD as Consulting Physician (Obstetrics and Gynecology) Kennith Gain, MD as Consulting Physician (Allergy) Delrae Rend, MD as Consulting Physician (Endocrinology) Rockwell Germany, RN as Oncology Nurse Navigator Mauro Kaufmann, RN as Oncology Nurse Navigator Scot Dock, NP OTHER MD:  CHIEF COMPLAINT: estrogen receptor positive breast cancer  CURRENT TREATMENT: Adjuvant chemotherapy   INTERVAL HISTORY: Veronica Reilly returns today for follow up of her estrogen receptor positive breast cancer to receive adjuvant chemotherapy.  She is going to receive Docetaxel/Cyclophosphamide given on day 1 of a 21 day cycle with Fulphila for growth factor support (Neulasta biosimilar).    Since her last visit with [place], she underwent port insertion on 09/27/2018.  She tolerated this well.    REVIEW OF SYSTEMS: Veronica Reilly is doing well today.  She has no new issues.  She continues to have some breast swelling at her left lumpectomy site.  She notes it is improving.  She says that she has no questions or concerns about her anti nausea medications and took her dexamethasone yesterday.  She has h/o constipation, and notes that it has been 2 days since her last bowel movement.  She normally takes metamucil for this and plans on taking some this afternoon.  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  Her husband works as Radiographer, therapeutic at [employer].  She has regained her strength since surgery.  She has no fever, chills, pain, cough,  shortness of breath, bladder issues, nausea, or vomiting. A detailed ROS Was otherwise non contributory.    HISTORY OF CURRENT ILLNESS: From the original intake note:  Veronica Reilly had some calcifications in the left breast noted March 2019 on screening mammography.. She underwent biopsy of the left retroareolar area of concern on 03/18/2017 with pathology ([id number]) showing fibrocystic changes with no malignancy. Return in 6 months for left diagnostic mammography and ultrasonography was recommended, and she reports this took place in November 2019 at her gynecologist's office and the changes previously noted were stable.  She underwent bilateral diagnostic mammography with tomography and left breast ultrasonography at [facility] on 06/27/2018 showing: breast density category C; left breast upper-outer quadrant 3.1 cm group of indeterminate calcifications; persistent benign-appearing cyst in the subareolar left breast, post core needle biopsy changes.  On physical exam there were no palpable masses.  Targeted ultrasound revealed no suspicious masses.  There was a left subareolar breast cyst measuring 1.0 cm.  Left axilla was unremarkable.  Accordingly on 06/29/2018 she proceeded to biopsy of the left breast calcifications area in question. The pathology from this procedure ([id number]) showed: invasive ductal carcinoma, grade 2, with ductal carcinoma in situ. Prognostic indicators significant for: estrogen receptor, 100% positive with strong staining intensity and progesterone receptor, 0% negative. Proliferation marker Ki67 at 10%. HER2 negtive by immunohistochemistry, (0).  The patient's subsequent history is as detailed below.   PAST MEDICAL HISTORY: Past Medical History:  Diagnosis Date   ASCUS (atypical squamous cells of undetermined significance) on Pap smear    Asthma    controlled with daily inhaler   Breast cancer ([place])  06/29/2018   Left IDC   Dysplasia of cervix, low  grade (CIN 1)    Eczema    Family history of breast cancer    Family history of lung cancer    Family history of prostate cancer    GERD (gastroesophageal reflux disease)    Heart murmur    History of chicken pox    Hypertension    Hypothyroid    Uterine fibroid   Heart murmur, evaluated by cardiologist   PAST SURGICAL HISTORY: Past Surgical History:  Procedure Laterality Date   ADENOIDECTOMY     BREAST LUMPECTOMY WITH RADIOACTIVE SEED AND SENTINEL LYMPH NODE BIOPSY Left 08/22/2018   Procedure: LEFT BREAST LUMPECTOMY WITH RADIOACTIVE SEED AND SENTINEL LYMPH NODE MAPPING;  Surgeon: Erroll Luna, MD;  Location: [facility];  Service: General;  Laterality: Left;   LEEP     MYOMECTOMY     PORTACATH PLACEMENT Right 09/27/2018   Procedure: INSERTION PORT-A-CATH WITH ULTRASOUND;  Surgeon: Erroll Luna, MD;  Location: [facility] OR;  Service: General;  Laterality: Right;   TONSILECTOMY, ADENOIDECTOMY, BILATERAL MYRINGOTOMY AND TUBES     TONSILLECTOMY      FAMILY HISTORY: Family History  Problem Relation Age of Onset   Breast cancer Maternal Grandmother    Heart disease Father    Diabetes Father    Prostate cancer Father 36   Breast cancer Mother 42   Hypertension Other    Lung cancer Maternal Aunt    Allergic rhinitis Neg Hx    Angioedema Neg Hx    Asthma Neg Hx    Eczema Neg Hx    Immunodeficiency Neg Hx    Urticaria Neg Hx    Patient's father was 24 years old when he died from diabetes and renal failure. Patient's mother died from breast cancer at age 9. The patient notes a family hx of breast and possibly ovarian cancer. Her mother was diagnosed with inflammatory breast cancer at around age 85 (she was a patient here), and the patient maternal grandmother was also diagnosed with breast cancer and died at age 36.  The patient has 1 brother, no sisters. She also had one maternal aunt with lung cancer and a history of smoking, her father  with prostate cancer at age 9, and a possible GYN cancer in a paternal aunt.   GYNECOLOGIC HISTORY:  Last menstrual period February 2020  menarche: 53 years old GX P 0, she has had fertility treatments LMP 05/2018, lasted 3 days, irregular (s/p ablation) Contraceptive: used for apprx. 6 months HRT never used  Hysterectomy? no BSO? 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 She is married. Husband Celesta Gentile is a trauma nurse at [employer]. She lives at home with her husband and some hermit crabs and salt and fresh water fish. 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    ADVANCED DIRECTIVES: Husband Celesta Gentile is her HCPOA.   HEALTH MAINTENANCE: Social History   Tobacco Use   Smoking status: Never Smoker   Smokeless tobacco: Never Used  Substance Use Topics   Alcohol use: No   Drug use: No     Colonoscopy: Pending  PAP: Up-to-date  Bone density: never done   Allergies  Allergen Reactions   Apple Anaphylaxis   Iohexol     Contrast dye   Other Shortness Of Breath    CHERRIES, TREES,  MOLD,DUST, COCKROACHES   Povidone-Iodine Anaphylaxis   Shellfish Allergy Anaphylaxis   Peach [Prunus Persica] Itching and Hives    mouth   Zocor [Simvastatin] Other (See Comments)    Alopecia   Betadine [Povidone Iodine]     Due to shellfish allergy   Latex Rash    Current Outpatient Medications  Medication Sig Dispense Refill   albuterol (VENTOLIN HFA) 108 (90 Base) MCG/ACT inhaler Use 2 puffs every four hours as needed for cough or wheeze.  May use 2 puffs 10-20 minutes prior to exercise. 1 Inhaler 1   budesonide-formoterol (SYMBICORT) 160-4.5 MCG/ACT inhaler INHALE 2 PUFFS INTO THE LUNGS 2 (TWO) TIMES DAILY. (Patient taking differently: Inhale 2 puffs into the lungs daily. ) 10.2 g 5   Cholecalciferol (VITAMIN D3) 50 MCG (2000 UT) TABS  Take 2,000 Units by mouth every evening.     dexamethasone (DECADRON) 4 MG tablet Take 2 tablets (8 mg total) by mouth 2 (two) times daily. Start the day before Taxotere. Then again the day after chemo for 3 days. 30 tablet 1   EPINEPHrine (EPIPEN 2-PAK) 0.3 mg/0.3 mL IJ SOAJ injection Use as directed for a threatening allergic reaction (Patient taking differently: Inject 0.3 mg into the muscle as needed for anaphylaxis. Use as directed for a threatening allergic reaction) 4 Device 1   fluticasone (FLONASE) 50 MCG/ACT nasal spray USE 2 SPRAYS IN EACH NOSTRIL ONCE DAILY FOR STUFFY NOSE OR DRAINAGE (Patient taking differently: Place 2 sprays into both nostrils daily. USE 2 SPRAYS IN EACH NOSTRIL ONCE DAILY FOR STUFFY NOSE OR DRAINAG) 16 g 5   folic acid (FOLVITE) 1 MG tablet Take 1 mg by mouth every evening.      hydrochlorothiazide (MICROZIDE) 12.5 MG capsule Take 12.5 mg by mouth daily.   1   levocetirizine (XYZAL) 5 MG tablet Take 5 mg by mouth daily.     levothyroxine (SYNTHROID, LEVOTHROID) 112 MCG tablet Take 112 mcg by mouth daily before breakfast.      lidocaine-prilocaine (EMLA) cream Apply to affected area once 30 g 3   LORazepam (ATIVAN) 0.5 MG tablet Take 1 tablet (0.5 mg total) by mouth at bedtime as needed (Nausea or vomiting). 30 tablet 0   montelukast (SINGULAIR) 10 MG tablet Take 1 tablet (10 mg total) by mouth at bedtime. 30 tablet 2   NIFEdipine (PROCARDIA XL/ADALAT-CC) 60 MG 24 hr tablet Take 60 mg by mouth daily.  1   Olopatadine HCl (PAZEO) 0.7 % SOLN Apply 1 drop to eye daily. (Patient taking differently: Apply 1 drop to eye daily as needed (allergies.). ) 2.5 mL 5   prochlorperazine (COMPAZINE) 10 MG tablet Take 1 tablet (10 mg total) by mouth every 6 (six) hours as needed (Nausea or vomiting). 30 tablet 1   triamcinolone cream (KENALOG) 0.1 % Apply 1 application topically daily as needed. (Patient taking differently: Apply 1 application topically daily as needed.  eczema.) 80 g 2   ibuprofen (ADVIL) 800 MG tablet Take 1 tablet (800 mg total) by mouth every 8 (eight) hours as needed. 30 tablet 0   oxyCODONE (OXY IR/ROXICODONE) 5 MG immediate release tablet Take 1 tablet (5 mg total) by mouth every 6 (six) hours as needed for severe pain. 15 tablet 0   No current facility-administered medications for this visit.     OBJECTIVE:   Vitals:   09/29/18 0904  BP: 129/88  Pulse: 88  Resp: 18  Temp: 98.2 F (36.8 C)  SpO2: 100%  Body mass index is 34.69 kg/m.   Wt Readings from Last 3 Encounters:  09/29/18 214 lb 14.4 oz (97.5 kg)  09/27/18 211 lb 12.8 oz (96.1 kg)  09/20/18 211 lb 12.8 oz (96.1 kg)      ECOG FS:1 - Symptomatic but completely ambulatory GENERAL: Patient is a well appearing female in no acute distress HEENT:  Sclerae anicteric.  Oropharynx clear and moist. No ulcerations or evidence of oropharyngeal candidiasis. Neck is supple.  NODES:  No cervical, supraclavicular, or axillary lymphadenopathy palpated.  BREAST EXAM:  Left breast s/p lumpectomy, seroma at site, resolving, mild amt of swelling, no erythema or warmth noted. LUNGS:  Clear to auscultation bilaterally.  No wheezes or rhonchi. HEART:  Regular rate and rhythm. No murmur appreciated. ABDOMEN:  Soft, nontender.  Positive, normoactive bowel sounds. No organomegaly palpated. MSK:  No focal spinal tenderness to palpation. Full range of motion bilaterally in the upper extremities. EXTREMITIES:  No peripheral edema.   SKIN:  Clear with no obvious rashes or skin changes. No nail dyscrasia. NEURO:  Nonfocal. Well oriented.  Appropriate affect.    LAB RESULTS:  CMP     Component Value Date/Time   NA 140 09/20/2018 0945   K 3.6 09/20/2018 0945   CL 105 09/20/2018 0945   CO2 24 09/20/2018 0945   GLUCOSE 99 09/20/2018 0945   BUN 14 09/20/2018 0945   CREATININE 0.95 09/20/2018 0945   CREATININE 1.00 07/11/2018 1235   CALCIUM 9.3 09/20/2018 0945   PROT 7.5 08/18/2018  1100   ALBUMIN 3.9 08/18/2018 1100   AST 15 08/18/2018 1100   AST 11 (L) 07/11/2018 1235   ALT 15 08/18/2018 1100   ALT 13 07/11/2018 1235   ALKPHOS 64 08/18/2018 1100   BILITOT 0.8 08/18/2018 1100   BILITOT 0.4 07/11/2018 1235   GFRNONAA >60 09/20/2018 0945   GFRNONAA >60 07/11/2018 1235   GFRAA >60 09/20/2018 0945   GFRAA >60 07/11/2018 1235    No results found for: TOTALPROTELP, ALBUMINELP, A1GS, A2GS, BETS, BETA2SER, GAMS, MSPIKE, SPEI  No results found for: KPAFRELGTCHN, LAMBDASER, KAPLAMBRATIO  Lab Results  Component Value Date   WBC 16.2 (H) 09/29/2018   NEUTROABS 12.7 (H) 09/29/2018   HGB 12.5 09/29/2018   HCT 38.4 09/29/2018   MCV 79.0 (L) 09/29/2018   PLT 292 09/29/2018    '@LASTCHEMISTRY'$ @  No results found for: LABCA2  No components found for: [id number]  No results for input(s): INR in the last 168 hours.  No results found for: LABCA2  No results found for: ATF573  No results found for: UKG254  No results found for: YHC623  No results found for: CA2729  No components found for: HGQUANT  No results found for: CEA1 / No results found for: CEA1   No results found for: AFPTUMOR  No results found for: [place]  No results found for: PSA1  Appointment on 09/29/2018  Component Date Value Ref Range Status   WBC Count 09/29/2018 16.2* 4.0 - 10.5 K/uL Final   RBC 09/29/2018 4.86  3.87 - 5.11 MIL/uL Final   Hemoglobin 09/29/2018 12.5  12.0 - 15.0 g/dL Final   HCT 09/29/2018 38.4  36.0 - 46.0 % Final   MCV 09/29/2018 79.0* 80.0 - 100.0 fL Final   MCH 09/29/2018 25.7* 26.0 - 34.0 pg Final   MCHC 09/29/2018 32.6  30.0 - 36.0 g/dL Final   RDW 09/29/2018 13.8  11.5 - 15.5 % Final   Platelet Count 09/29/2018 292  150 -  400 K/uL Final   nRBC 09/29/2018 0.0  0.0 - 0.2 % Final   Neutrophils Relative % 09/29/2018 78  % Final   Neutro Abs 09/29/2018 12.7* 1.7 - 7.7 K/uL Final   Lymphocytes Relative 09/29/2018 14  % Final   Lymphs Abs  09/29/2018 2.2  0.7 - 4.0 K/uL Final   Monocytes Relative 09/29/2018 7  % Final   Monocytes Absolute 09/29/2018 1.1* 0.1 - 1.0 K/uL Final   Eosinophils Relative 09/29/2018 0  % Final   Eosinophils Absolute 09/29/2018 0.0  0.0 - 0.5 K/uL Final   Basophils Relative 09/29/2018 0  % Final   Basophils Absolute 09/29/2018 0.1  0.0 - 0.1 K/uL Final   Immature Granulocytes 09/29/2018 1  % Final   Abs Immature Granulocytes 09/29/2018 0.13* 0.00 - 0.07 K/uL Final   Performed at [facility], [place] [address], [place], [place] [address]  Admission on 09/27/2018, Discharged on 09/27/2018  Component Date Value Ref Range Status   Preg Test, Ur 09/27/2018 NEGATIVE  NEGATIVE Final   Comment:        THE SENSITIVITY OF THIS METHODOLOGY IS >24 mIU/mL     (this displays the last labs from the last 3 days)  No results found for: TOTALPROTELP, ALBUMINELP, A1GS, A2GS, BETS, BETA2SER, GAMS, MSPIKE, SPEI (this displays SPEP labs)  No results found for: KPAFRELGTCHN, LAMBDASER, KAPLAMBRATIO (kappa/lambda light chains)  No results found for: HGBA, HGBA2QUANT, HGBFQUANT, HGBSQUAN (Hemoglobinopathy evaluation)   No results found for: LDH  No results found for: IRON, TIBC, IRONPCTSAT (Iron and TIBC)  No results found for: FERRITIN  Urinalysis    Component Value Date/Time   COLORURINE YELLOW 05/15/2007 0957   APPEARANCEUR CLEAR 05/15/2007 0957   LABSPEC 1.010 05/15/2007 0957   PHURINE 8.0 05/15/2007 0957   GLUCOSEU NEGATIVE 05/15/2007 0957   HGBUR NEGATIVE 05/15/2007 0957   BILIRUBINUR NEGATIVE 05/15/2007 0957   KETONESUR NEGATIVE 05/15/2007 0957   PROTEINUR NEGATIVE 05/15/2007 0957   UROBILINOGEN 0.2 05/15/2007 0957   NITRITE NEGATIVE 05/15/2007 0957   LEUKOCYTESUR  05/15/2007 0957    NEGATIVE MICROSCOPIC NOT DONE ON URINES WITH NEGATIVE PROTEIN, BLOOD, LEUKOCYTES, NITRITE, OR GLUCOSE <1000 mg/dL.     STUDIES: Dg Chest Port 1 View  Result Date:  09/27/2018 CLINICAL DATA:  Follow-up port placement EXAM: PORTABLE CHEST 1 VIEW COMPARISON:  None. FINDINGS: Cardiac shadows within normal limits. Right-sided chest wall port is noted with the catheter tip at the cavoatrial junction. No pneumothorax is seen. Postsurgical changes in the left axilla are noted. IMPRESSION: No pneumothorax following port placement on the right Electronically Signed   By: Inez Catalina M.D.   On: 09/27/2018 09:46   Dg Fluoro Guide Cv Line-no Report  Result Date: 09/27/2018 Fluoroscopy was utilized by the requesting physician.  No radiographic interpretation.    ELIGIBLE FOR AVAILABLE RESEARCH PROTOCOL: no  ASSESSMENT: 53 y.o.  [place] status post left breast upper outer quadrant biopsy 06/29/2018 for a clinical T2N0 invasive ductal carcinoma, grade 2, estrogen receptor positive, progesterone receptor negative, with an MIB-1-1 of 10%, and no HER-2 amplification.  (1) genetics 07/18/2018 through the [facility] Common Hereditary Cancers Panel found no deleterious mutations in APC, ATM, AXIN2, BARD1, BMPR1A, BRCA1, BRCA2, BRIP1, CDH1, CDKN2A (p14ARF), CDKN2A (p16INK4a), CKD4, CHEK2, CTNNA1, DICER1, EPCAM (Deletion/duplication testing only), GREM1 (promoter region deletion/duplication testing only), KIT, MEN1, MLH1, MSH2, MSH3, MSH6, MUTYH, NBN, NF1, NHTL1, PALB2, PDGFRA, PMS2, POLD1, POLE, PTEN, RAD50, RAD51C, RAD51D, SDHB, SDHC, SDHD, SMAD4, SMARCA4. STK11, TP53,  TSC1, TSC2, and VHL.  The following genes were evaluated for sequence changes only: SDHA and HOXB13 c.251G>A variant only.   (2) status post left lumpectomy and sentinel lymph node sampling 08/22/2018 for a pT2 pN0, stage IB invasive ductal carcinoma, grade 2, with negative margins  (a) a total of 10 axillary lymph nodes were removed  (3) Oncotype score of 34 predicts a risk of recurrence outside the breast in the next 9 years of 22% if the patient's only systemic therapy is antiestrogens for 5 years.  It also  predicts significant benefit from chemotherapy.  (4) adjuvant chemotherapy will consist of cyclophosphamide and docetaxel given every 21 days x 4 beginning on 09/29/2018  (5) adjuvant radiation to follow  (6) antiestrogens to start at the completion of local treatment.  PLAN: Veronica Reilly is doing well today.  She will start chemotherapy with Docetaxel and cyclophosphamide.  We again reviewed the risks and benefits and she is ready to proceed with treatment.  She has her port placed, and tolerated this well.  Veronica Reilly will get the dignicap today and we talked about that some.  I reviewed that we will be monitoring her for peripheral neuropathy in particular and to keep an eye out for this.  She knows that if she starts feeling dehydrated to please let us know and we can get her in for fluids.  taygan connell was recommended to go ahead and take metamucil tonight.  I suggested colace or miralax if that doesn't work.  We will see Veronica Reilly next week for labs and f/u to evaluate how she tolerated treatment.  She was recommended to continue with the appropriate pandemic precautions. She knows to call for any questions that may arise between now and her next appointment.  We are happy to see her sooner if needed.  A total of (30) minutes of face-to-face time was spent with this patient with greater than 50% of that time in counseling and care-coordination.   Wilber Bihari, NP   09/29/2018 9:17 AM Medical Oncology and Hematology [facility] [address] [place], [place] [address] Tel. [phone]    Fax. [phone]

## 2018-09-29 NOTE — Telephone Encounter (Signed)
Spoke with patient during her 1st TC today.  She states everything has gone well.  She has her dignicap.  Encouraged her to call with any needs or concerns.

## 2018-09-29 NOTE — Patient Instructions (Signed)
Docetaxel injection What is this medicine? DOCETAXEL (doe se TAX el) is a chemotherapy drug. It targets fast dividing cells, like cancer cells, and causes these cells to die. This medicine is used to treat many types of cancers like breast cancer, certain stomach cancers, head and neck cancer, lung cancer, and prostate cancer. This medicine may be used for other purposes; ask your health care provider or pharmacist if you have questions. COMMON BRAND NAME(S): Docefrez, Taxotere What should I tell my health care provider before I take this medicine? They need to know if you have any of these conditions:  infection (especially a virus infection such as chickenpox, cold sores, or herpes)  liver disease  low blood counts, like low white cell, platelet, or red cell counts  an unusual or allergic reaction to docetaxel, polysorbate 80, other chemotherapy agents, other medicines, foods, dyes, or preservatives  pregnant or trying to get pregnant  breast-feeding How should I use this medicine? This drug is given as an infusion into a vein. It is administered in a hospital or clinic by a specially trained health care professional. Talk to your pediatrician regarding the use of this medicine in children. Special care may be needed. Overdosage: If you think you have taken too much of this medicine contact a poison control center or emergency room at once. NOTE: This medicine is only for you. Do not share this medicine with others. What if I miss a dose? It is important not to miss your dose. Call your doctor or health care professional if you are unable to keep an appointment. What may interact with this medicine?  aprepitant  certain antibiotics like erythromycin or clarithromycin  certain antivirals for HIV or hepatitis  certain medicines for fungal infections like fluconazole, itraconazole, ketoconazole, posaconazole, or  voriconazole  cimetidine  ciprofloxacin  conivaptan  cyclosporine  dronedarone  fluvoxamine  grapefruit juice  imatinib  verapamil This list may not describe all possible interactions. Give your health care provider a list of all the medicines, herbs, non-prescription drugs, or dietary supplements you use. Also tell them if you smoke, drink alcohol, or use illegal drugs. Some items may interact with your medicine. What should I watch for while using this medicine? Your condition will be monitored carefully while you are receiving this medicine. You will need important blood work done while you are taking this medicine. Call your doctor or health care professional for advice if you get a fever, chills or sore throat, or other symptoms of a cold or flu. Do not treat yourself. This drug decreases your body's ability to fight infections. Try to avoid being around people who are sick. Some products may contain alcohol. Ask your health care professional if this medicine contains alcohol. Be sure to tell all health care professionals you are taking this medicine. Certain medicines, like metronidazole and disulfiram, can cause an unpleasant reaction when taken with alcohol. The reaction includes flushing, headache, nausea, vomiting, sweating, and increased thirst. The reaction can last from 30 minutes to several hours. You may get drowsy or dizzy. Do not drive, use machinery, or do anything that needs mental alertness until you know how this medicine affects you. Do not stand or sit up quickly, especially if you are an older patient. This reduces the risk of dizzy or fainting spells. Alcohol may interfere with the effect of this medicine. Talk to your health care professional about your risk of cancer. You may be more at risk for certain types of cancer if   you take this medicine. Do not become pregnant while taking this medicine or for 6 months after stopping it. Women should inform their doctor if  they wish to become pregnant or think they might be pregnant. There is a potential for serious side effects to an unborn child. Talk to your health care professional or pharmacist for more information. Do not breast-feed an infant while taking this medicine or for 1 to 2 weeks after stopping it. Males who get this medicine must use a condom during sex with females who can get pregnant. If you get a woman pregnant, the baby could have birth defects. The baby could die before they are born. You will need to continue wearing a condom for 3 months after stopping the medicine. Tell your health care provider right away if your partner becomes pregnant while you are taking this medicine. This may interfere with the ability to father a child. You should talk to your doctor or health care professional if you are concerned about your fertility. What side effects may I notice from receiving this medicine? Side effects that you should report to your doctor or health care professional as soon as possible:  allergic reactions like skin rash, itching or hives, swelling of the face, lips, or tongue  blurred vision  breathing problems  changes in vision  low blood counts - This drug may decrease the number of white blood cells, red blood cells and platelets. You may be at increased risk for infections and bleeding.  nausea and vomiting  pain, redness or irritation at site where injected  pain, tingling, numbness in the hands or feet  redness, blistering, peeling, or loosening of the skin, including inside the mouth  signs of decreased platelets or bleeding - bruising, pinpoint red spots on the skin, black, tarry stools, nosebleeds  signs of decreased red blood cells - unusually weak or tired, fainting spells, lightheadedness  signs of infection - fever or chills, cough, sore throat, pain or difficulty passing urine  swelling of the ankle, feet, hands Side effects that usually do not require medical  attention (report to your doctor or health care professional if they continue or are bothersome):  constipation  diarrhea  fingernail or toenail changes  hair loss  loss of appetite  mouth sores  muscle pain This list may not describe all possible side effects. Call your doctor for medical advice about side effects. You may report side effects to FDA at 1-800-FDA-1088. Where should I keep my medicine? This drug is given in a hospital or clinic and will not be stored at home. NOTE: This sheet is a summary. It may not cover all possible information. If you have questions about this medicine, talk to your doctor, pharmacist, or health care provider.  2020 [employer]/Gold Standard (2018-03-10 12:23:11) Cyclophosphamide injection What is this medicine? CYCLOPHOSPHAMIDE (sye kloe FOSS fa mide) is a chemotherapy drug. It slows the growth of cancer cells. This medicine is used to treat many types of cancer like lymphoma, myeloma, leukemia, breast cancer, and ovarian cancer, to name a few. This medicine may be used for other purposes; ask your health care provider or pharmacist if you have questions. COMMON BRAND NAME(S): Cytoxan, Neosar What should I tell my health care provider before I take this medicine? They need to know if you have any of these conditions:  blood disorders  history of other chemotherapy  infection  kidney disease  liver disease  recent or ongoing radiation therapy  tumors in the   bone marrow  an unusual or allergic reaction to cyclophosphamide, other chemotherapy, other medicines, foods, dyes, or preservatives  pregnant or trying to get pregnant  breast-feeding How should I use this medicine? This drug is usually given as an injection into a vein or muscle or by infusion into a vein. It is administered in a hospital or clinic by a specially trained health care professional. Talk to your pediatrician regarding the use of this medicine in children. Special  care may be needed. Overdosage: If you think you have taken too much of this medicine contact a poison control center or emergency room at once. NOTE: This medicine is only for you. Do not share this medicine with others. What if I miss a dose? It is important not to miss your dose. Call your doctor or health care professional if you are unable to keep an appointment. What may interact with this medicine? This medicine may interact with the following medications:  amiodarone  amphotericin B  azathioprine  certain antiviral medicines for HIV or AIDS such as protease inhibitors (e.g., indinavir, ritonavir) and zidovudine  certain blood pressure medications such as benazepril, captopril, enalapril, fosinopril, lisinopril, moexipril, monopril, perindopril, quinapril, ramipril, trandolapril  certain cancer medications such as anthracyclines (e.g., daunorubicin, doxorubicin), busulfan, cytarabine, paclitaxel, pentostatin, tamoxifen, trastuzumab  certain diuretics such as chlorothiazide, chlorthalidone, hydrochlorothiazide, indapamide, metolazone  certain medicines that treat or prevent blood clots like warfarin  certain muscle relaxants such as succinylcholine  cyclosporine  etanercept  indomethacin  medicines to increase blood counts like filgrastim, pegfilgrastim, sargramostim  medicines used as general anesthesia  metronidazole  natalizumab This list may not describe all possible interactions. Give your health care provider a list of all the medicines, herbs, non-prescription drugs, or dietary supplements you use. Also tell them if you smoke, drink alcohol, or use illegal drugs. Some items may interact with your medicine. What should I watch for while using this medicine? Visit your doctor for checks on your progress. This drug may make you feel generally unwell. This is not uncommon, as chemotherapy can affect healthy cells as well as cancer cells. Report any side effects.  Continue your course of treatment even though you feel ill unless your doctor tells you to stop. Drink water or other fluids as directed. Urinate often, even at night. In some cases, you may be given additional medicines to help with side effects. Follow all directions for their use. Call your doctor or health care professional for advice if you get a fever, chills or sore throat, or other symptoms of a cold or flu. Do not treat yourself. This drug decreases your body's ability to fight infections. Try to avoid being around people who are sick. This medicine may increase your risk to bruise or bleed. Call your doctor or health care professional if you notice any unusual bleeding. Be careful brushing and flossing your teeth or using a toothpick because you may get an infection or bleed more easily. If you have any dental work done, tell your dentist you are receiving this medicine. You may get drowsy or dizzy. Do not drive, use machinery, or do anything that needs mental alertness until you know how this medicine affects you. Do not become pregnant while taking this medicine or for 1 year after stopping it. Women should inform their doctor if they wish to become pregnant or think they might be pregnant. Men should not father a child while taking this medicine and for 4 months after stopping it. There is   a potential for serious side effects to an unborn child. Talk to your health care professional or pharmacist for more information. Do not breast-feed an infant while taking this medicine. This medicine may interfere with the ability to have a child. This medicine has caused ovarian failure in some women. This medicine has caused reduced sperm counts in some men. You should talk with your doctor or health care professional if you are concerned about your fertility. If you are going to have surgery, tell your doctor or health care professional that you have taken this medicine. What side effects may I notice  from receiving this medicine? Side effects that you should report to your doctor or health care professional as soon as possible:  allergic reactions like skin rash, itching or hives, swelling of the face, lips, or tongue  low blood counts - this medicine may decrease the number of white blood cells, red blood cells and platelets. You may be at increased risk for infections and bleeding.  signs of infection - fever or chills, cough, sore throat, pain or difficulty passing urine  signs of decreased platelets or bleeding - bruising, pinpoint red spots on the skin, black, tarry stools, blood in the urine  signs of decreased red blood cells - unusually weak or tired, fainting spells, lightheadedness  breathing problems  dark urine  dizziness  palpitations  swelling of the ankles, feet, hands  trouble passing urine or change in the amount of urine  weight gain  yellowing of the eyes or skin Side effects that usually do not require medical attention (report to your doctor or health care professional if they continue or are bothersome):  changes in nail or skin color  hair loss  missed menstrual periods  mouth sores  nausea, vomiting This list may not describe all possible side effects. Call your doctor for medical advice about side effects. You may report side effects to FDA at 1-800-FDA-1088. Where should I keep my medicine? This drug is given in a hospital or clinic and will not be stored at home. NOTE: This sheet is a summary. It may not cover all possible information. If you have questions about this medicine, talk to your doctor, pharmacist, or health care provider.  2020 [employer] Standard (2011-11-19 16:22:58)  

## 2018-09-30 ENCOUNTER — Inpatient Hospital Stay: Payer: BC Managed Care – PPO

## 2018-09-30 VITALS — BP 138/95 | HR 98 | Temp 98.3°F | Resp 16

## 2018-09-30 DIAGNOSIS — C50412 Malignant neoplasm of upper-outer quadrant of left female breast: Secondary | ICD-10-CM

## 2018-09-30 DIAGNOSIS — Z170 Estrogen receptor positive status [ER+]: Secondary | ICD-10-CM

## 2018-09-30 MED ORDER — PEGFILGRASTIM-JMDB 6 MG/0.6ML ~~LOC~~ SOSY
6.0000 mg | PREFILLED_SYRINGE | Freq: Once | SUBCUTANEOUS | Status: AC
  Administered 2018-09-30: 14:00:00 6 mg via SUBCUTANEOUS

## 2018-09-30 NOTE — Patient Instructions (Signed)

## 2018-10-02 ENCOUNTER — Telehealth: Payer: Self-pay | Admitting: *Deleted

## 2018-10-04 ENCOUNTER — Other Ambulatory Visit: Payer: Self-pay | Admitting: Adult Health

## 2018-10-04 ENCOUNTER — Encounter: Payer: Self-pay | Admitting: Adult Health

## 2018-10-04 ENCOUNTER — Other Ambulatory Visit: Payer: Self-pay | Admitting: *Deleted

## 2018-10-04 DIAGNOSIS — Z170 Estrogen receptor positive status [ER+]: Secondary | ICD-10-CM

## 2018-10-04 DIAGNOSIS — C50412 Malignant neoplasm of upper-outer quadrant of left female breast: Secondary | ICD-10-CM

## 2018-10-04 MED ORDER — TRIAMCINOLONE ACETONIDE 0.025 % EX OINT: 1.0000 "application " | TOPICAL_OINTMENT | Freq: Two times a day (BID) | CUTANEOUS | 0 refills | Status: DC

## 2018-10-05 ENCOUNTER — Inpatient Hospital Stay: Payer: BC Managed Care – PPO

## 2018-10-05 ENCOUNTER — Other Ambulatory Visit: Payer: Self-pay

## 2018-10-05 ENCOUNTER — Inpatient Hospital Stay (HOSPITAL_BASED_OUTPATIENT_CLINIC_OR_DEPARTMENT_OTHER): Payer: BC Managed Care – PPO | Admitting: Adult Health

## 2018-10-05 ENCOUNTER — Encounter: Payer: Self-pay | Admitting: Adult Health

## 2018-10-05 VITALS — BP 119/71 | HR 96 | Temp 98.2°F | Resp 18 | Ht 66.0 in | Wt 217.2 lb

## 2018-10-05 DIAGNOSIS — Z170 Estrogen receptor positive status [ER+]: Secondary | ICD-10-CM

## 2018-10-05 DIAGNOSIS — C50412 Malignant neoplasm of upper-outer quadrant of left female breast: Secondary | ICD-10-CM

## 2018-10-05 LAB — CBC WITH DIFFERENTIAL (CANCER CENTER ONLY)
Abs Immature Granulocytes: 0.52 10*3/uL — ABNORMAL HIGH (ref 0.00–0.07)
Basophils Absolute: 0.2 10*3/uL — ABNORMAL HIGH (ref 0.0–0.1)
Basophils Relative: 2 %
Eosinophils Absolute: 0.3 10*3/uL (ref 0.0–0.5)
Eosinophils Relative: 2 %
HCT: 37.3 % (ref 36.0–46.0)
Hemoglobin: 12.1 g/dL (ref 12.0–15.0)
Immature Granulocytes: 4 %
Lymphocytes Relative: 27 %
Lymphs Abs: 3.4 10*3/uL (ref 0.7–4.0)
MCH: 26.1 pg (ref 26.0–34.0)
MCHC: 32.4 g/dL (ref 30.0–36.0)
MCV: 80.6 fL (ref 80.0–100.0)
Monocytes Absolute: 1 10*3/uL (ref 0.1–1.0)
Monocytes Relative: 8 %
Neutro Abs: 7.3 10*3/uL (ref 1.7–7.7)
Neutrophils Relative %: 57 %
Platelet Count: 212 10*3/uL (ref 150–400)
RBC: 4.63 MIL/uL (ref 3.87–5.11)
RDW: 13.8 % (ref 11.5–15.5)
WBC Count: 12.8 10*3/uL — ABNORMAL HIGH (ref 4.0–10.5)
nRBC: 0 % (ref 0.0–0.2)

## 2018-10-05 LAB — CMP (CANCER CENTER ONLY)
ALT: 15 U/L (ref 0–44)
AST: 11 U/L — ABNORMAL LOW (ref 15–41)
Albumin: 3.6 g/dL (ref 3.5–5.0)
Alkaline Phosphatase: 103 U/L (ref 38–126)
Anion gap: 8 (ref 5–15)
BUN: 15 mg/dL (ref 6–20)
CO2: 26 mmol/L (ref 22–32)
Calcium: 8.9 mg/dL (ref 8.9–10.3)
Chloride: 105 mmol/L (ref 98–111)
Creatinine: 0.87 mg/dL (ref 0.44–1.00)
GFR, Est AFR Am: >60 mL/min (ref >60)
GFR, Estimated: >60 mL/min (ref >60)
Glucose, Bld: 124 mg/dL — ABNORMAL HIGH (ref 70–99)
Potassium: 3.5 mmol/L (ref 3.5–5.1)
Sodium: 139 mmol/L (ref 135–145)
Total Bilirubin: 0.5 mg/dL (ref 0.3–1.2)
Total Protein: 6.8 g/dL (ref 6.5–8.1)

## 2018-10-05 MED ORDER — FLUCONAZOLE 200 MG PO TABS: 200.0000 mg | ORAL_TABLET | Freq: Every day | ORAL | 0 refills | Status: DC

## 2018-10-05 MED ORDER — HYDROXYZINE HCL 10 MG PO TABS: 10.0000 mg | ORAL_TABLET | Freq: Three times a day (TID) | ORAL | 0 refills | Status: DC | PRN

## 2018-10-05 NOTE — Telephone Encounter (Signed)
This RN contacted pt who stated she has had 2-3 episodes of " watery diarrhea " .  This RN reviewed chart- and recommended for the pt to take Imodium as directed on package.  She understands to hydrate well and to call if symptoms are not relieved.

## 2018-10-05 NOTE — Progress Notes (Addendum)
[facility]  [phone] [phone]     ID: Veronica Reilly DOB: [birth year]  MR#: [id number]  TIW#:[id number]  Patient Care Team: Leeroy Cha, MD as PCP - General (Internal Medicine) Magrinat, Virgie Dad, MD as Consulting Physician (Oncology) Kyung Rudd, MD as Consulting Physician (Radiation Oncology) Erroll Luna, MD as Consulting Physician (General Surgery) Delsa Bern, MD as Consulting Physician (Obstetrics and Gynecology) Kennith Gain, MD as Consulting Physician (Allergy) Delrae Rend, MD as Consulting Physician (Endocrinology) Rockwell Germany, RN as Oncology Nurse Navigator Mauro Kaufmann, RN as Oncology Nurse Navigator Scot Dock, NP OTHER MD:  CHIEF COMPLAINT: estrogen receptor positive breast cancer  CURRENT TREATMENT: Adjuvant chemotherapy  INTERVAL HISTORY: [name] returns today for follow up of her estrogen receptor positive breast cancer to receive adjuvant chemotherapy.  She is receiving Docetaxel/Cyclophosphamide given on day 1 of a 21 day cycle with Fulphila for growth factor support (Neulasta biosimilar).  Today is cycle 1 day 8.    REVIEW OF SYSTEMS: Marc is fatigued today.  She is frustrated as well because she thought her port would be accessed from her labs, and it was not, she was stuck peripherally.  She wants to know if all labs can be drawn from her port from now on.  She has some achiness in her lower back, but notes this has been the first day she has felt poorly.  Also, over the past 5 days she has developed a rash that burns and itches in between her breasts, on her chest wall, and her neck.  She does have hot flashes and carries a portable fan due to this.    [name] denies any fever, chills, chest pain, palpitations. She is eating and drinking well, and has had no nausea/vomiting.  She denies peripheral neuropathy.  A detailed ROS was otherwise non contributory.    HISTORY OF CURRENT  ILLNESS: From the original intake note:  Veronica Reilly had some calcifications in the left breast noted March 2019 on screening mammography.. She underwent biopsy of the left retroareolar area of concern on 03/18/2017 with pathology ([id number]) showing fibrocystic changes with no malignancy. Return in 6 months for left diagnostic mammography and ultrasonography was recommended, and she reports this took place in November 2019 at her gynecologist's office and the changes previously noted were stable.  She underwent bilateral diagnostic mammography with tomography and left breast ultrasonography at [facility] on 06/27/2018 showing: breast density category C; left breast upper-outer quadrant 3.1 cm group of indeterminate calcifications; persistent benign-appearing cyst in the subareolar left breast, post core needle biopsy changes.  On physical exam there were no palpable masses.  Targeted ultrasound revealed no suspicious masses.  There was a left subareolar breast cyst measuring 1.0 cm.  Left axilla was unremarkable.  Accordingly on 06/29/2018 she proceeded to biopsy of the left breast calcifications area in question. The pathology from this procedure ([id number]) showed: invasive ductal carcinoma, grade 2, with ductal carcinoma in situ. Prognostic indicators significant for: estrogen receptor, 100% positive with strong staining intensity and progesterone receptor, 0% negative. Proliferation marker Ki67 at 10%. HER2 negtive by immunohistochemistry, (0).  The patient's subsequent history is as detailed below.   PAST MEDICAL HISTORY: Past Medical History:  Diagnosis Date  . ASCUS (atypical squamous cells of undetermined significance) on Pap smear   . Asthma    controlled with daily inhaler  . Breast cancer ([place]) 06/29/2018   Left IDC  . Dysplasia of cervix, low grade (CIN  1)   . Eczema   . Family history of breast cancer   . Family history of lung cancer   . Family history of prostate  cancer   . GERD (gastroesophageal reflux disease)   . Heart murmur   . History of chicken pox   . Hypertension   . Hypothyroid   . Uterine fibroid   Heart murmur, evaluated by cardiologist   PAST SURGICAL HISTORY: Past Surgical History:  Procedure Laterality Date  . ADENOIDECTOMY    . BREAST LUMPECTOMY WITH RADIOACTIVE SEED AND SENTINEL LYMPH NODE BIOPSY Left 08/22/2018   Procedure: LEFT BREAST LUMPECTOMY WITH RADIOACTIVE SEED AND SENTINEL LYMPH NODE MAPPING;  Surgeon: Erroll Luna, MD;  Location: [facility];  Service: General;  Laterality: Left;  . LEEP    . MYOMECTOMY    . PORTACATH PLACEMENT Right 09/27/2018   Procedure: INSERTION PORT-A-CATH WITH ULTRASOUND;  Surgeon: Erroll Luna, MD;  Location: [facility];  Service: General;  Laterality: Right;  . TONSILECTOMY, ADENOIDECTOMY, BILATERAL MYRINGOTOMY AND TUBES    . TONSILLECTOMY      FAMILY HISTORY: Family History  Problem Relation Age of Onset  . Breast cancer Maternal Grandmother   . Heart disease Father   . Diabetes Father   . Prostate cancer Father 51  . Breast cancer Mother 51  . Hypertension Other   . Lung cancer Maternal Aunt   . Allergic rhinitis Neg Hx   . Angioedema Neg Hx   . Asthma Neg Hx   . Eczema Neg Hx   . Immunodeficiency Neg Hx   . Urticaria Neg Hx    Patient's father was 104 years old when he died from diabetes and renal failure. Patient's mother died from breast cancer at age 29. The patient notes a family hx of breast and possibly ovarian cancer. Her mother was diagnosed with inflammatory breast cancer at around age 48 (she was a patient here), and the patient maternal grandmother was also diagnosed with breast cancer and died at age 51.  The patient has 1 brother, no sisters. She also had one maternal aunt with lung cancer and a history of smoking, her father with prostate cancer at age 46, and a possible GYN cancer in a paternal aunt.   GYNECOLOGIC HISTORY:  Last menstrual period  February 2020  menarche: 53 years old GX P 0, she has had fertility treatments LMP 05/2018, lasted 3 days, irregular (s/p ablation) Contraceptive: used for apprx. 6 months HRT never used  Hysterectomy? no BSO? 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 She is married. Husband Celesta Gentile is a trauma nurse at [employer]. She lives at home with her husband and some hermit crabs and salt and fresh water fish. 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    ADVANCED DIRECTIVES: Husband Celesta Gentile is her HCPOA.   HEALTH MAINTENANCE: Social History   Tobacco Use  . Smoking status: Never Smoker  . Smokeless tobacco: Never Used  Substance Use Topics  . Alcohol use: No  . Drug use: No     Colonoscopy: Pending  PAP: Up-to-date  Bone density: never done   Allergies  Allergen Reactions  . Apple Anaphylaxis  . Iohexol     Contrast dye  . Other Shortness Of Breath    CHERRIES, TREES, MOLD,DUST, COCKROACHES  . Povidone-Iodine Anaphylaxis  . Shellfish Allergy Anaphylaxis  .  Peach [Prunus Persica] Itching and Hives    mouth  . Zocor [Simvastatin] Other (See Comments)    Alopecia  . Betadine [Povidone Iodine]     Due to shellfish allergy  . Latex Rash    Current Outpatient Medications  Medication Sig Dispense Refill  . albuterol (VENTOLIN HFA) 108 (90 Base) MCG/ACT inhaler Use 2 puffs every four hours as needed for cough or wheeze.  May use 2 puffs 10-20 minutes prior to exercise. 1 Inhaler 1  . budesonide-formoterol (SYMBICORT) 160-4.5 MCG/ACT inhaler INHALE 2 PUFFS INTO THE LUNGS 2 (TWO) TIMES DAILY. (Patient taking differently: Inhale 2 puffs into the lungs daily. ) 10.2 g 5  . Cholecalciferol (VITAMIN D3) 50 MCG (2000 UT) TABS Take 2,000 Units by mouth every evening.    Marland Kitchen dexamethasone (DECADRON) 4 MG tablet Take 2 tablets (8 mg total) by mouth 2 (two)  times daily. Start the day before Taxotere. Then again the day after chemo for 3 days. 30 tablet 1  . EPINEPHrine (EPIPEN 2-PAK) 0.3 mg/0.3 mL IJ SOAJ injection Use as directed for a threatening allergic reaction (Patient taking differently: Inject 0.3 mg into the muscle as needed for anaphylaxis. Use as directed for a threatening allergic reaction) 4 Device 1  . fluconazole (DIFLUCAN) 200 MG tablet Take 1 tablet (200 mg total) by mouth daily. 30 tablet 0  . fluticasone (FLONASE) 50 MCG/ACT nasal spray USE 2 SPRAYS IN EACH NOSTRIL ONCE DAILY FOR STUFFY NOSE OR DRAINAGE (Patient taking differently: Place 2 sprays into both nostrils daily. USE 2 SPRAYS IN EACH NOSTRIL ONCE DAILY FOR STUFFY NOSE OR DRAINAG) 16 g 5  . folic acid (FOLVITE) 1 MG tablet Take 1 mg by mouth every evening.     . hydrochlorothiazide (MICROZIDE) 12.5 MG capsule Take 12.5 mg by mouth daily.   1  . hydrOXYzine (ATARAX/VISTARIL) 10 MG tablet Take 1 tablet (10 mg total) by mouth every 8 (eight) hours as needed. 30 tablet 0  . ibuprofen (ADVIL) 800 MG tablet Take 1 tablet (800 mg total) by mouth every 8 (eight) hours as needed. 30 tablet 0  . levocetirizine (XYZAL) 5 MG tablet Take 5 mg by mouth daily.    Marland Kitchen levothyroxine (SYNTHROID, LEVOTHROID) 112 MCG tablet Take 112 mcg by mouth daily before breakfast.     . lidocaine-prilocaine (EMLA) cream Apply to affected area once 30 g 3  . LORazepam (ATIVAN) 0.5 MG tablet Take 1 tablet (0.5 mg total) by mouth at bedtime as needed (Nausea or vomiting). 30 tablet 0  . montelukast (SINGULAIR) 10 MG tablet Take 1 tablet (10 mg total) by mouth at bedtime. 30 tablet 2  . NIFEdipine (PROCARDIA XL/ADALAT-CC) 60 MG 24 hr tablet Take 60 mg by mouth daily.  1  . Olopatadine HCl (PAZEO) 0.7 % SOLN Apply 1 drop to eye daily. (Patient taking differently: Apply 1 drop to eye daily as needed (allergies.). ) 2.5 mL 5  . oxyCODONE (OXY IR/ROXICODONE) 5 MG immediate release tablet Take 1 tablet (5 mg total) by  mouth every 6 (six) hours as needed for severe pain. 15 tablet 0  . prochlorperazine (COMPAZINE) 10 MG tablet Take 1 tablet (10 mg total) by mouth every 6 (six) hours as needed (Nausea or vomiting). 30 tablet 1  . triamcinolone (KENALOG) 0.025 % ointment Apply 1 application topically 2 (two) times daily. 30 g 0  . triamcinolone cream (KENALOG) 0.1 % Apply 1 application topically daily as needed. (Patient taking differently: Apply 1 application topically daily  as needed. eczema.) 80 g 2   No current facility-administered medications for this visit.     OBJECTIVE:   Vitals:   10/05/18 0901  BP: 119/71  Pulse: 96  Resp: 18  Temp: 98.2 F (36.8 C)  SpO2: 100%     Body mass index is 35.06 kg/m.   Wt Readings from Last 3 Encounters:  10/05/18 217 lb 3.2 oz (98.5 kg)  09/29/18 214 lb 14.4 oz (97.5 kg)  09/27/18 211 lb 12.8 oz (96.1 kg)  ECOG FS:1 - Symptomatic but completely ambulatory GENERAL: Patient is a well appearing female in no acute distress HEENT:  Sclerae anicteric.  Oropharynx clear and moist. No ulcerations or evidence of oropharyngeal candidiasis. Neck is supple.  NODES:  No cervical, supraclavicular, or axillary lymphadenopathy palpated.  BREAST EXAM:  Left breast s/p lumpectomy, erythema and scabbing noted underneath breasts bilaterall, in between breasts, and on chest wall and behind neck. LUNGS:  Clear to auscultation bilaterally.  No wheezes or rhonchi. HEART:  Regular rate and rhythm. No murmur appreciated. ABDOMEN:  Soft, nontender.  Positive, normoactive bowel sounds. No organomegaly palpated. MSK:  No focal spinal tenderness to palpation. Full range of motion bilaterally in the upper extremities. EXTREMITIES:  No peripheral edema.   SKIN:  Clear with no obvious rashes or skin changes. No nail dyscrasia. NEURO:  Nonfocal. Well oriented.  Appropriate affect.    LAB RESULTS:  CMP     Component Value Date/Time   NA 139 10/05/2018 0843   K 3.5 10/05/2018 0843    CL 105 10/05/2018 0843   CO2 26 10/05/2018 0843   GLUCOSE 124 (H) 10/05/2018 0843   BUN 15 10/05/2018 0843   CREATININE 0.87 10/05/2018 0843   CALCIUM 8.9 10/05/2018 0843   PROT 6.8 10/05/2018 0843   ALBUMIN 3.6 10/05/2018 0843   AST 11 (L) 10/05/2018 0843   ALT 15 10/05/2018 0843   ALKPHOS 103 10/05/2018 0843   BILITOT 0.5 10/05/2018 0843   GFRNONAA >60 10/05/2018 0843   GFRAA >60 10/05/2018 0843    No results found for: TOTALPROTELP, ALBUMINELP, A1GS, A2GS, BETS, BETA2SER, GAMS, MSPIKE, SPEI  No results found for: KPAFRELGTCHN, LAMBDASER, KAPLAMBRATIO  Lab Results  Component Value Date   WBC 12.8 (H) 10/05/2018   NEUTROABS 7.3 10/05/2018   HGB 12.1 10/05/2018   HCT 37.3 10/05/2018   MCV 80.6 10/05/2018   PLT 212 10/05/2018    _0 @  No results found for: LABCA2  No components found for: IAXKPV374  No results for input(s): INR in the last 168 hours.  No results found for: LABCA2  No results found for: MOL078  No results found for: MLJ449  No results found for: EEF007  No results found for: CA2729  No components found for: HGQUANT  No results found for: CEA1 / No results found for: CEA1   No results found for: AFPTUMOR  No results found for: CHROMOGRNA  No results found for: PSA1  Appointment on 10/05/2018  Component Date Value Ref Range Status  . Sodium 10/05/2018 139  135 - 145 mmol/L Final  . Potassium 10/05/2018 3.5  3.5 - 5.1 mmol/L Final  . Chloride 10/05/2018 105  98 - 111 mmol/L Final  . CO2 10/05/2018 26  22 - 32 mmol/L Final  . Glucose, Bld 10/05/2018 124* 70 - 99 mg/dL Final  . BUN 10/05/2018 15  6 - 20 mg/dL Final  . Creatinine 10/05/2018 0.87  0.44 - 1.00 mg/dL Final  . Calcium 10/05/2018 8.9  8.9 - 10.3 mg/dL Final  . Total Protein 10/05/2018 6.8  6.5 - 8.1 g/dL Final  . Albumin 10/05/2018 3.6  3.5 - 5.0 g/dL Final  . AST 10/05/2018 11* 15 - 41 U/L Final  . ALT 10/05/2018 15  0 - 44 U/L Final  . Alkaline Phosphatase  10/05/2018 103  38 - 126 U/L Final  . Total Bilirubin 10/05/2018 0.5  0.3 - 1.2 mg/dL Final  . GFR, Est Non Af Am 10/05/2018 >60  >60 mL/min Final  . GFR, Est AFR Am 10/05/2018 >60  >60 mL/min Final  . Anion gap 10/05/2018 8  5 - 15 Final   Performed at [facility], [place] [address], [place], [place] [address]  . WBC Count 10/05/2018 12.8* 4.0 - 10.5 K/uL Final  . RBC 10/05/2018 4.63  3.87 - 5.11 MIL/uL Final  . Hemoglobin 10/05/2018 12.1  12.0 - 15.0 g/dL Final  . HCT 10/05/2018 37.3  36.0 - 46.0 % Final  . MCV 10/05/2018 80.6  80.0 - 100.0 fL Final  . MCH 10/05/2018 26.1  26.0 - 34.0 pg Final  . MCHC 10/05/2018 32.4  30.0 - 36.0 g/dL Final  . RDW 10/05/2018 13.8  11.5 - 15.5 % Final  . Platelet Count 10/05/2018 212  150 - 400 K/uL Final  . nRBC 10/05/2018 0.0  0.0 - 0.2 % Final  . Neutrophils Relative % 10/05/2018 57  % Final  . Neutro Abs 10/05/2018 7.3  1.7 - 7.7 K/uL Final  . Lymphocytes Relative 10/05/2018 27  % Final  . Lymphs Abs 10/05/2018 3.4  0.7 - 4.0 K/uL Final  . Monocytes Relative 10/05/2018 8  % Final  . Monocytes Absolute 10/05/2018 1.0  0.1 - 1.0 K/uL Final  . Eosinophils Relative 10/05/2018 2  % Final  . Eosinophils Absolute 10/05/2018 0.3  0.0 - 0.5 K/uL Final  . Basophils Relative 10/05/2018 2  % Final  . Basophils Absolute 10/05/2018 0.2* 0.0 - 0.1 K/uL Final  . Immature Granulocytes 10/05/2018 4  % Final   Increased IG's, likely caused by Bone Marrow Colony Stimulating Factor received within 30 days.  . Abs Immature Granulocytes 10/05/2018 0.52* 0.00 - 0.07 K/uL Final   Performed at [facility], [address] [place], [place] [address]    (this displays the last labs from the last 3 days)  No results found for: TOTALPROTELP, ALBUMINELP, A1GS, A2GS, BETS, BETA2SER, GAMS, MSPIKE, SPEI (this displays SPEP labs)  No results found for: KPAFRELGTCHN, LAMBDASER, KAPLAMBRATIO (kappa/lambda light chains)  No  results found for: HGBA, HGBA2QUANT, HGBFQUANT, HGBSQUAN (Hemoglobinopathy evaluation)   No results found for: LDH  No results found for: IRON, TIBC, IRONPCTSAT (Iron and TIBC)  No results found for: FERRITIN  Urinalysis    Component Value Date/Time   COLORURINE YELLOW 05/15/2007 0957   APPEARANCEUR CLEAR 05/15/2007 0957   LABSPEC 1.010 05/15/2007 0957   PHURINE 8.0 05/15/2007 0957   GLUCOSEU NEGATIVE 05/15/2007 0957   HGBUR NEGATIVE 05/15/2007 0957   BILIRUBINUR NEGATIVE 05/15/2007 0957   KETONESUR NEGATIVE 05/15/2007 0957   PROTEINUR NEGATIVE 05/15/2007 0957   UROBILINOGEN 0.2 05/15/2007 0957   NITRITE NEGATIVE 05/15/2007 0957   LEUKOCYTESUR  05/15/2007 0957    NEGATIVE MICROSCOPIC NOT DONE ON URINES WITH NEGATIVE PROTEIN, BLOOD, LEUKOCYTES, NITRITE, OR GLUCOSE <1000 mg/dL.     STUDIES: Dg Chest Port 1 View  Result Date: 09/27/2018 CLINICAL DATA:  Follow-up port placement EXAM: PORTABLE CHEST 1 VIEW COMPARISON:  None.  FINDINGS: Cardiac shadows within normal limits. Right-sided chest wall port is noted with the catheter tip at the cavoatrial junction. No pneumothorax is seen. Postsurgical changes in the left axilla are noted. IMPRESSION: No pneumothorax following port placement on the right Electronically Signed   By: Inez Catalina M.D.   On: 09/27/2018 09:46   Dg Fluoro Guide Cv Line-no Report  Result Date: 09/27/2018 Fluoroscopy was utilized by the requesting physician.  No radiographic interpretation.    ELIGIBLE FOR AVAILABLE RESEARCH PROTOCOL: no  ASSESSMENT: 53 y.o.  [place] status post left breast upper outer quadrant biopsy 06/29/2018 for a clinical T2N0 invasive ductal carcinoma, grade 2, estrogen receptor positive, progesterone receptor negative, with an MIB-1-1 of 10%, and no HER-2 amplification.  (1) genetics 07/18/2018 through the [facility] Common Hereditary Cancers Panel found no deleterious mutations in APC, ATM, AXIN2, BARD1, BMPR1A, BRCA1, BRCA2, BRIP1,  CDH1, CDKN2A (p14ARF), CDKN2A (p16INK4a), CKD4, CHEK2, CTNNA1, DICER1, EPCAM (Deletion/duplication testing only), GREM1 (promoter region deletion/duplication testing only), KIT, MEN1, MLH1, MSH2, MSH3, MSH6, MUTYH, NBN, NF1, NHTL1, PALB2, PDGFRA, PMS2, POLD1, POLE, PTEN, RAD50, RAD51C, RAD51D, SDHB, SDHC, SDHD, SMAD4, SMARCA4. STK11, TP53, TSC1, TSC2, and VHL.  The following genes were evaluated for sequence changes only: SDHA and HOXB13 c.251G>A variant only.   (2) status post left lumpectomy and sentinel lymph node sampling 08/22/2018 for a pT2 pN0, stage IB invasive ductal carcinoma, grade 2, with negative margins  (a) a total of 10 axillary lymph nodes were removed  (3) Oncotype score of 34 predicts a risk of recurrence outside the breast in the next 9 years of 22% if the patient's only systemic therapy is antiestrogens for 5 years.  It also predicts significant benefit from chemotherapy.  (4) adjuvant chemotherapy consisting of cyclophosphamide and docetaxel given every 21 days x 4 beginning on 09/29/2018  (5) adjuvant radiation to follow  (6) antiestrogens to start at the completion of local treatment.  PLAN: Nitza Schmid is feeling moderately well.  Her WBC is 12.  She is not neutropenic.  She tolerated the chemotherapy moderately well.  She didn't have any nausea or vomiting, which is good.    Audrea Muscat met with Dr. Jana Hakim who looked at her rash.  He thinks it is fungal, so I sent in Diflucan 283m daily x 5 days for her to take.  I also sent in hydroxyzine for the itching.  If the rash isn't improved by then, she will call [place]  MJohana Hopkinsonwas recommended to take tylenol for her achiness, and to continue with drinking fluids and eating healthy.  I recommended she continue with her activity level.    We will see her back in 2 weeks for labs, f/u, and her next treatment.  She was recommended to continue with the appropriate pandemic precautions. She knows to call for any questions that may arise  between now and her next appointment.  We are happy to see her sooner if needed.   LWilber Bihari NP   10/05/2018 10:58 AM Medical Oncology and Hematology [facility] [address]Tel. [phone]   Fax. [phone]  ADDENDUM: MHaynes Dagetolerated her first dose of chemotherapy moderately well.  She is certainly not having any endorgan damage at this point and she is managing the transient side effects well.  The rash I am seeing shows hyperpigmented skin, and is confluent and itchy.  It is consistent with a fungal rash and we are going to try some  Diflucan and see if that clears it.  Otherwise we may have to add steroids which I would prefer not to do since she already is getting prophylactic dexamethasone for the Taxotere  I personally saw this patient and performed a substantive portion of this encounter with the listed APP documented above.   Chauncey Cruel, MD Medical Oncology and Hematology [facility] [address] [place], [place] [address] Tel. [phone]    Fax. [phone]

## 2018-10-09 ENCOUNTER — Encounter: Payer: Self-pay | Admitting: Oncology

## 2018-10-09 ENCOUNTER — Encounter: Payer: Self-pay | Admitting: Adult Health

## 2018-10-09 NOTE — Progress Notes (Signed)
Called patient to introduce myself as Arboriculturist and to offer available resources.  Discussed available copay assistance through PAF for diagnosis. Patient states they are over income for income-related programs. She did have a concern about bills already received prior to chemo. Provided her with the information to [place] for medical expenses and gave her the number to the social workers to assist with application if qualifies.  Patient over income for one-time $1000 [employer] grant based on conversation for a household of 2.  Gave her my contact name and number for any additional financial questions or concerns.

## 2018-10-10 ENCOUNTER — Other Ambulatory Visit: Payer: Self-pay | Admitting: Adult Health

## 2018-10-11 ENCOUNTER — Other Ambulatory Visit: Payer: Self-pay | Admitting: Adult Health

## 2018-10-11 ENCOUNTER — Encounter: Payer: Self-pay | Admitting: Adult Health

## 2018-10-11 ENCOUNTER — Other Ambulatory Visit: Payer: Self-pay

## 2018-10-11 DIAGNOSIS — J4540 Moderate persistent asthma, uncomplicated: Secondary | ICD-10-CM

## 2018-10-11 MED ORDER — CLOTRIMAZOLE-BETAMETHASONE 1-0.05 % EX CREA: 1.0000 "application " | TOPICAL_CREAM | Freq: Two times a day (BID) | CUTANEOUS | 0 refills | Status: DC

## 2018-10-11 MED ORDER — MONTELUKAST SODIUM 10 MG PO TABS: 10.0000 mg | ORAL_TABLET | Freq: Every day | ORAL | 0 refills | Status: DC

## 2018-10-18 ENCOUNTER — Encounter: Payer: Self-pay | Admitting: Adult Health

## 2018-10-18 ENCOUNTER — Other Ambulatory Visit: Payer: Self-pay | Admitting: *Deleted

## 2018-10-18 DIAGNOSIS — C50412 Malignant neoplasm of upper-outer quadrant of left female breast: Secondary | ICD-10-CM

## 2018-10-19 ENCOUNTER — Other Ambulatory Visit: Payer: Self-pay

## 2018-10-19 ENCOUNTER — Inpatient Hospital Stay: Payer: BC Managed Care – PPO

## 2018-10-19 ENCOUNTER — Inpatient Hospital Stay: Payer: BC Managed Care – PPO | Attending: Oncology

## 2018-10-19 ENCOUNTER — Telehealth: Payer: Self-pay | Admitting: *Deleted

## 2018-10-19 ENCOUNTER — Inpatient Hospital Stay (HOSPITAL_BASED_OUTPATIENT_CLINIC_OR_DEPARTMENT_OTHER): Payer: BC Managed Care – PPO | Admitting: Oncology

## 2018-10-19 ENCOUNTER — Other Ambulatory Visit: Payer: Self-pay | Admitting: Oncology

## 2018-10-19 ENCOUNTER — Encounter: Payer: Self-pay | Admitting: Oncology

## 2018-10-19 ENCOUNTER — Encounter: Payer: Self-pay | Admitting: Adult Health

## 2018-10-19 VITALS — BP 129/82 | HR 99 | Temp 98.5°F | Resp 20

## 2018-10-19 DIAGNOSIS — Z5111 Encounter for antineoplastic chemotherapy: Secondary | ICD-10-CM | POA: Insufficient documentation

## 2018-10-19 DIAGNOSIS — I10 Essential (primary) hypertension: Secondary | ICD-10-CM | POA: Insufficient documentation

## 2018-10-19 DIAGNOSIS — R5383 Other fatigue: Secondary | ICD-10-CM | POA: Insufficient documentation

## 2018-10-19 DIAGNOSIS — C50412 Malignant neoplasm of upper-outer quadrant of left female breast: Secondary | ICD-10-CM | POA: Diagnosis present

## 2018-10-19 DIAGNOSIS — Z79899 Other long term (current) drug therapy: Secondary | ICD-10-CM | POA: Insufficient documentation

## 2018-10-19 DIAGNOSIS — Z170 Estrogen receptor positive status [ER+]: Secondary | ICD-10-CM | POA: Diagnosis not present

## 2018-10-19 DIAGNOSIS — Z5189 Encounter for other specified aftercare: Secondary | ICD-10-CM | POA: Diagnosis not present

## 2018-10-19 DIAGNOSIS — J45909 Unspecified asthma, uncomplicated: Secondary | ICD-10-CM | POA: Insufficient documentation

## 2018-10-19 DIAGNOSIS — R21 Rash and other nonspecific skin eruption: Secondary | ICD-10-CM | POA: Diagnosis not present

## 2018-10-19 DIAGNOSIS — R000 Tachycardia, unspecified: Secondary | ICD-10-CM | POA: Insufficient documentation

## 2018-10-19 DIAGNOSIS — E860 Dehydration: Secondary | ICD-10-CM | POA: Diagnosis not present

## 2018-10-19 DIAGNOSIS — Z801 Family history of malignant neoplasm of trachea, bronchus and lung: Secondary | ICD-10-CM | POA: Insufficient documentation

## 2018-10-19 DIAGNOSIS — Z803 Family history of malignant neoplasm of breast: Secondary | ICD-10-CM | POA: Diagnosis not present

## 2018-10-19 DIAGNOSIS — Z7951 Long term (current) use of inhaled steroids: Secondary | ICD-10-CM | POA: Insufficient documentation

## 2018-10-19 DIAGNOSIS — Z95828 Presence of other vascular implants and grafts: Secondary | ICD-10-CM

## 2018-10-19 DIAGNOSIS — Z8042 Family history of malignant neoplasm of prostate: Secondary | ICD-10-CM | POA: Insufficient documentation

## 2018-10-19 DIAGNOSIS — Z791 Long term (current) use of non-steroidal anti-inflammatories (NSAID): Secondary | ICD-10-CM | POA: Diagnosis not present

## 2018-10-19 DIAGNOSIS — E039 Hypothyroidism, unspecified: Secondary | ICD-10-CM | POA: Diagnosis not present

## 2018-10-19 LAB — CMP (CANCER CENTER ONLY)
ALT: 25 U/L (ref 0–44)
AST: 12 U/L — ABNORMAL LOW (ref 15–41)
Albumin: 4.2 g/dL (ref 3.5–5.0)
Alkaline Phosphatase: 94 U/L (ref 38–126)
Anion gap: 11 (ref 5–15)
BUN: 23 mg/dL — ABNORMAL HIGH (ref 6–20)
CO2: 24 mmol/L (ref 22–32)
Calcium: 9.8 mg/dL (ref 8.9–10.3)
Chloride: 106 mmol/L (ref 98–111)
Creatinine: 0.92 mg/dL (ref 0.44–1.00)
GFR, Est AFR Am: >60 mL/min (ref >60)
GFR, Estimated: >60 mL/min (ref >60)
Glucose, Bld: 145 mg/dL — ABNORMAL HIGH (ref 70–99)
Potassium: 4.1 mmol/L (ref 3.5–5.1)
Sodium: 141 mmol/L (ref 135–145)
Total Bilirubin: 0.4 mg/dL (ref 0.3–1.2)
Total Protein: 7.8 g/dL (ref 6.5–8.1)

## 2018-10-19 LAB — CBC WITH DIFFERENTIAL (CANCER CENTER ONLY)
Abs Immature Granulocytes: 0.74 10*3/uL — ABNORMAL HIGH (ref 0.00–0.07)
Basophils Absolute: 0.1 10*3/uL (ref 0.0–0.1)
Basophils Relative: 0 %
Eosinophils Absolute: 0 10*3/uL (ref 0.0–0.5)
Eosinophils Relative: 0 %
HCT: 36.8 % (ref 36.0–46.0)
Hemoglobin: 12.2 g/dL (ref 12.0–15.0)
Immature Granulocytes: 3 %
Lymphocytes Relative: 6 %
Lymphs Abs: 1.8 10*3/uL (ref 0.7–4.0)
MCH: 26.5 pg (ref 26.0–34.0)
MCHC: 33.2 g/dL (ref 30.0–36.0)
MCV: 79.8 fL — ABNORMAL LOW (ref 80.0–100.0)
Monocytes Absolute: 0.5 10*3/uL (ref 0.1–1.0)
Monocytes Relative: 2 %
Neutro Abs: 24.6 10*3/uL — ABNORMAL HIGH (ref 1.7–7.7)
Neutrophils Relative %: 89 %
Platelet Count: 230 10*3/uL (ref 150–400)
RBC: 4.61 MIL/uL (ref 3.87–5.11)
RDW: 14.6 % (ref 11.5–15.5)
WBC Count: 27.7 10*3/uL — ABNORMAL HIGH (ref 4.0–10.5)
nRBC: 0 % (ref 0.0–0.2)

## 2018-10-19 MED ORDER — SODIUM CHLORIDE 0.9 % IV SOLN
Freq: Once | INTRAVENOUS | Status: AC
  Administered 2018-10-19: 10:00:00 via INTRAVENOUS
  Filled 2018-10-19: qty 250

## 2018-10-19 MED ORDER — SODIUM CHLORIDE 0.9% FLUSH
10.0000 mL | INTRAVENOUS | Status: DC | PRN
  Administered 2018-10-19: 13:00:00 10 mL
  Filled 2018-10-19: qty 10

## 2018-10-19 MED ORDER — DEXAMETHASONE SODIUM PHOSPHATE 10 MG/ML IJ SOLN
10.0000 mg | Freq: Once | INTRAMUSCULAR | Status: AC
  Administered 2018-10-19: 10 mg via INTRAVENOUS

## 2018-10-19 MED ORDER — FLUCONAZOLE 200 MG PO TABS: 200.0000 mg | ORAL_TABLET | Freq: Every day | ORAL | 1 refills | Status: DC

## 2018-10-19 MED ORDER — PALONOSETRON HCL INJECTION 0.25 MG/5ML
INTRAVENOUS | Status: AC
  Filled 2018-10-19: qty 5

## 2018-10-19 MED ORDER — SODIUM CHLORIDE 0.9 % IV SOLN
600.0000 mg/m2 | Freq: Once | INTRAVENOUS | Status: AC
  Administered 2018-10-19: 12:00:00 1260 mg via INTRAVENOUS
  Filled 2018-10-19: qty 63

## 2018-10-19 MED ORDER — SODIUM CHLORIDE 0.9% FLUSH
10.0000 mL | INTRAVENOUS | Status: DC | PRN
  Administered 2018-10-19: 10 mL
  Filled 2018-10-19: qty 10

## 2018-10-19 MED ORDER — DEXAMETHASONE SODIUM PHOSPHATE 10 MG/ML IJ SOLN
INTRAMUSCULAR | Status: AC
  Filled 2018-10-19: qty 1

## 2018-10-19 MED ORDER — HEPARIN SOD (PORK) LOCK FLUSH 100 UNIT/ML IV SOLN
500.0000 [IU] | Freq: Once | INTRAVENOUS | Status: AC | PRN
  Administered 2018-10-19: 13:00:00 500 [IU]
  Filled 2018-10-19: qty 5

## 2018-10-19 MED ORDER — SODIUM CHLORIDE 0.9 % IV SOLN
75.0000 mg/m2 | Freq: Once | INTRAVENOUS | Status: AC
  Administered 2018-10-19: 11:00:00 160 mg via INTRAVENOUS
  Filled 2018-10-19: qty 16

## 2018-10-19 MED ORDER — PALONOSETRON HCL INJECTION 0.25 MG/5ML
0.2500 mg | Freq: Once | INTRAVENOUS | Status: AC
  Administered 2018-10-19: 10:00:00 0.25 mg via INTRAVENOUS

## 2018-10-19 NOTE — Patient Instructions (Signed)
[facility] Discharge Instructions for Patients Receiving Chemotherapy  Today you received the following chemotherapy agents Taxotere; Cytoxin  To help prevent nausea and vomiting after your treatment, we encourage you to take your nausea medication as directed   If you develop nausea and vomiting that is not controlled by your nausea medication, call the clinic.   BELOW ARE SYMPTOMS THAT SHOULD BE REPORTED IMMEDIATELY:  *FEVER GREATER THAN 100.5 F  *CHILLS WITH OR WITHOUT FEVER  NAUSEA AND VOMITING THAT IS NOT CONTROLLED WITH YOUR NAUSEA MEDICATION  *UNUSUAL SHORTNESS OF BREATH  *UNUSUAL BRUISING OR BLEEDING  TENDERNESS IN MOUTH AND THROAT WITH OR WITHOUT PRESENCE OF ULCERS  *URINARY PROBLEMS  *BOWEL PROBLEMS  UNUSUAL RASH Items with * indicate a potential emergency and should be followed up as soon as possible.  Feel free to call the clinic should you have any questions or concerns. The clinic phone number is [phone].  Please show the CHEMO ALERT CARD at check-in to the Emergency Department and triage nurse.   

## 2018-10-19 NOTE — Patient Instructions (Signed)
[facility] Discharge Instructions for Patients Receiving Chemotherapy  Today you received the following chemotherapy agents Taxotere; cytoxin  To help prevent nausea and vomiting after your treatment, we encourage you to take your nausea medication as directed   If you develop nausea and vomiting that is not controlled by your nausea medication, call the clinic.   BELOW ARE SYMPTOMS THAT SHOULD BE REPORTED IMMEDIATELY:  *FEVER GREATER THAN 100.5 F  *CHILLS WITH OR WITHOUT FEVER  NAUSEA AND VOMITING THAT IS NOT CONTROLLED WITH YOUR NAUSEA MEDICATION  *UNUSUAL SHORTNESS OF BREATH  *UNUSUAL BRUISING OR BLEEDING  TENDERNESS IN MOUTH AND THROAT WITH OR WITHOUT PRESENCE OF ULCERS  *URINARY PROBLEMS  *BOWEL PROBLEMS  UNUSUAL RASH Items with * indicate a potential emergency and should be followed up as soon as possible.  Feel free to call the clinic should you have any questions or concerns. The clinic phone number is [phone].  Please show the [place] at check-in to the Emergency Department and triage nurse.

## 2018-10-19 NOTE — Progress Notes (Signed)
Met with patient at registration to introduce myself as Arboriculturist and to offer available resources.  Discussed one-time $1000 Radio broadcast assistant to assist with personal expenses while going through treatment. Also asked if she has met ded/OOP to determine if copay is needed for treatment drugs. She said she thinks she has but will check and let me know if she has not.  Gave her my card if interested in applying and for any additional financial questions or concerns.

## 2018-10-19 NOTE — Progress Notes (Signed)
[place]  [phone] [phone]     ID: Veronica Reilly DOB: 1965/02/22  MR#: [id number]  HLK#:[id number]  Patient Care Team: Leeroy Cha, MD as PCP - General (Internal Medicine) Kevyn Wengert, Virgie Dad, MD as Consulting Physician (Oncology) Kyung Rudd, MD as Consulting Physician (Radiation Oncology) Erroll Luna, MD as Consulting Physician (General Surgery) Delsa Bern, MD as Consulting Physician (Obstetrics and Gynecology) Kennith Gain, MD as Consulting Physician (Allergy) Delrae Rend, MD as Consulting Physician (Endocrinology) Rockwell Germany, RN as Oncology Nurse Navigator Mauro Kaufmann, RN as Oncology Nurse Navigator Chauncey Cruel, MD OTHER MD:  CHIEF COMPLAINT: estrogen receptor positive breast cancer  CURRENT TREATMENT: Adjuvant chemotherapy  INTERVAL HISTORY: Veronica Reilly returns today for continuing adjuvant chemotherapy for her estrogen receptor positive breast cancer.  Today is day 1 cycle 2 of cyclophosphamide/docetaxel which she receives every 21 days, with a total of 4 cycles planned.   REVIEW OF SYSTEMS: Veronica Reilly did generally well after her first cycle.  On September 12 she felt jittery and tired; on the next day she had a little bit of reflux and her hands were dry.  The skin yeast infection was improved with Diflucan on which she continues.  She had a mild headache.  She was able to work on her email and do a little bit of housework.  On September 14 she worked all day, virtually, and on September 15 she also worked.  She came off the steroids and felt a little jittery.  She took another steroid pill and that helped.  On September 16 she had significant insomnia.  She took an extra lorazepam which helped.  That day also she had diarrhea twice and on September 17 she had a slight diarrhea in the morning.  She took ibuprofen and Tylenol for low back pain and that took care of the issue.  Otherwise a detailed  review of systems was noncontributory and she says that for the last 7 to 10 days she has felt "like herself".  HISTORY OF CURRENT ILLNESS: From the original intake note:  Veronica Reilly had some calcifications in the left breast noted March 2019 on screening mammography.. She underwent biopsy of the left retroareolar area of concern on 03/18/2017 with pathology ([id number]) showing fibrocystic changes with no malignancy. Return in 6 months for left diagnostic mammography and ultrasonography was recommended, and she reports this took place in November 2019 at her gynecologist's office and the changes previously noted were stable.  She underwent bilateral diagnostic mammography with tomography and left breast ultrasonography at [facility] on 06/27/2018 showing: breast density category C; left breast upper-outer quadrant 3.1 cm group of indeterminate calcifications; persistent benign-appearing cyst in the subareolar left breast, post core needle biopsy changes.  On physical exam there were no palpable masses.  Targeted ultrasound revealed no suspicious masses.  There was a left subareolar breast cyst measuring 1.0 cm.  Left axilla was unremarkable.  Accordingly on 06/29/2018 she proceeded to biopsy of the left breast calcifications area in question. The pathology from this procedure ([id number]) showed: invasive ductal carcinoma, grade 2, with ductal carcinoma in situ. Prognostic indicators significant for: estrogen receptor, 100% positive with strong staining intensity and progesterone receptor, 0% negative. Proliferation marker Ki67 at 10%. HER2 negtive by immunohistochemistry, (0).  The patient's subsequent history is as detailed below.   PAST MEDICAL HISTORY: Past Medical History:  Diagnosis Date   ASCUS (atypical squamous cells of undetermined significance) on Pap smear  Asthma    controlled with daily inhaler   Breast cancer ([place]) 06/29/2018   Left IDC   Dysplasia of cervix, low  grade (CIN 1)    Eczema    Family history of breast cancer    Family history of lung cancer    Family history of prostate cancer    GERD (gastroesophageal reflux disease)    Heart murmur    History of chicken pox    Hypertension    Hypothyroid    Uterine fibroid   Heart murmur, evaluated by cardiologist   PAST SURGICAL HISTORY: Past Surgical History:  Procedure Laterality Date   ADENOIDECTOMY     BREAST LUMPECTOMY WITH RADIOACTIVE SEED AND SENTINEL LYMPH NODE BIOPSY Left 08/22/2018   Procedure: LEFT BREAST LUMPECTOMY WITH RADIOACTIVE SEED AND SENTINEL LYMPH NODE MAPPING;  Surgeon: Erroll Luna, MD;  Location: [facility];  Service: General;  Laterality: Left;   LEEP     MYOMECTOMY     PORTACATH PLACEMENT Right 09/27/2018   Procedure: INSERTION PORT-A-CATH WITH ULTRASOUND;  Surgeon: Erroll Luna, MD;  Location: [facility];  Service: General;  Laterality: Right;   TONSILECTOMY, ADENOIDECTOMY, BILATERAL MYRINGOTOMY AND TUBES     TONSILLECTOMY      FAMILY HISTORY: Family History  Problem Relation Age of Onset   Breast cancer Maternal Grandmother    Heart disease Father    Diabetes Father    Prostate cancer Father 31   Breast cancer Mother 29   Hypertension Other    Lung cancer Maternal Aunt    Allergic rhinitis Neg Hx    Angioedema Neg Hx    Asthma Neg Hx    Eczema Neg Hx    Immunodeficiency Neg Hx    Urticaria Neg Hx    Patient's father was 69 years old when he died from diabetes and renal failure. Patient's mother died from breast cancer at age 52. The patient notes a family hx of breast and possibly ovarian cancer. Her mother was diagnosed with inflammatory breast cancer at around age 62 (she was a patient here), and the patient maternal grandmother was also diagnosed with breast cancer and died at age 91.  The patient has 1 brother, no sisters. She also had one maternal aunt with lung cancer and a history of smoking, her father  with prostate cancer at age 55, and a possible GYN cancer in a paternal aunt.   GYNECOLOGIC HISTORY:  Last menstrual period February 2020  menarche: 53 years old GX P 0, she has had fertility treatments LMP 05/2018, lasted 3 days, irregular (s/p ablation) Contraceptive: used for apprx. 6 months HRT never used  Hysterectomy? no BSO? 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 She is married. Husband Celesta Gentile is a trauma nurse at [employer]. She lives at home with her husband and some hermit crabs and salt and fresh water fish. 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    ADVANCED DIRECTIVES: Husband Celesta Gentile is her HCPOA.   HEALTH MAINTENANCE: Social History   Tobacco Use   Smoking status: Never Smoker   Smokeless tobacco: Never Used  Substance Use Topics   Alcohol use: No   Drug use: No     Colonoscopy: Pending  PAP: Up-to-date  Bone density: never done   Allergies  Allergen Reactions   Apple Anaphylaxis   Iohexol  Contrast dye   Other Shortness Of Breath    CHERRIES, TREES, MOLD,DUST, COCKROACHES   Povidone-Iodine Anaphylaxis   Shellfish Allergy Anaphylaxis   Peach [Prunus Persica] Itching and Hives    mouth   Zocor [Simvastatin] Other (See Comments)    Alopecia   Betadine [Povidone Iodine]     Due to shellfish allergy   Latex Rash    Current Outpatient Medications  Medication Sig Dispense Refill   albuterol (VENTOLIN HFA) 108 (90 Base) MCG/ACT inhaler Use 2 puffs every four hours as needed for cough or wheeze.  May use 2 puffs 10-20 minutes prior to exercise. 1 Inhaler 1   budesonide-formoterol (SYMBICORT) 160-4.5 MCG/ACT inhaler INHALE 2 PUFFS INTO THE LUNGS 2 (TWO) TIMES DAILY. (Patient taking differently: Inhale 2 puffs into the lungs daily. ) 10.2 g 5   Cholecalciferol (VITAMIN D3) 50 MCG (2000 UT) TABS  Take 2,000 Units by mouth every evening.     clotrimazole-betamethasone (LOTRISONE) cream Apply 1 application topically 2 (two) times daily. 60 g 0   dexamethasone (DECADRON) 4 MG tablet Take 2 tablets (8 mg total) by mouth 2 (two) times daily. Start the day before Taxotere. Then again the day after chemo for 3 days. 30 tablet 1   EPINEPHrine (EPIPEN 2-PAK) 0.3 mg/0.3 mL IJ SOAJ injection Use as directed for a threatening allergic reaction (Patient taking differently: Inject 0.3 mg into the muscle as needed for anaphylaxis. Use as directed for a threatening allergic reaction) 4 Device 1   fluconazole (DIFLUCAN) 200 MG tablet Take 1 tablet (200 mg total) by mouth daily. 30 tablet 0   fluticasone (FLONASE) 50 MCG/ACT nasal spray USE 2 SPRAYS IN EACH NOSTRIL ONCE DAILY FOR STUFFY NOSE OR DRAINAGE (Patient taking differently: Place 2 sprays into both nostrils daily. USE 2 SPRAYS IN EACH NOSTRIL ONCE DAILY FOR STUFFY NOSE OR DRAINAG) 16 g 5   folic acid (FOLVITE) 1 MG tablet Take 1 mg by mouth every evening.      hydrochlorothiazide (MICROZIDE) 12.5 MG capsule Take 12.5 mg by mouth daily.   1   hydrOXYzine (ATARAX/VISTARIL) 10 MG tablet Take 1 tablet (10 mg total) by mouth every 8 (eight) hours as needed. 30 tablet 0   ibuprofen (ADVIL) 800 MG tablet Take 1 tablet (800 mg total) by mouth every 8 (eight) hours as needed. 30 tablet 0   levocetirizine (XYZAL) 5 MG tablet Take 5 mg by mouth daily.     levothyroxine (SYNTHROID, LEVOTHROID) 112 MCG tablet Take 112 mcg by mouth daily before breakfast.      lidocaine-prilocaine (EMLA) cream Apply to affected area once 30 g 3   LORazepam (ATIVAN) 0.5 MG tablet Take 1 tablet (0.5 mg total) by mouth at bedtime as needed (Nausea or vomiting). 30 tablet 0   montelukast (SINGULAIR) 10 MG tablet Take 1 tablet (10 mg total) by mouth at bedtime. 30 tablet 0   NIFEdipine (PROCARDIA XL/ADALAT-CC) 60 MG 24 hr tablet Take 60 mg by mouth daily.  1   Olopatadine  HCl (PAZEO) 0.7 % SOLN Apply 1 drop to eye daily. (Patient taking differently: Apply 1 drop to eye daily as needed (allergies.). ) 2.5 mL 5   oxyCODONE (OXY IR/ROXICODONE) 5 MG immediate release tablet Take 1 tablet (5 mg total) by mouth every 6 (six) hours as needed for severe pain. 15 tablet 0   prochlorperazine (COMPAZINE) 10 MG tablet Take 1 tablet (10 mg total) by mouth every 6 (six) hours as needed (Nausea or vomiting). [place]  tablet 1   triamcinolone (KENALOG) 0.025 % ointment Apply 1 application topically 2 (two) times daily. 30 g 0   triamcinolone cream (KENALOG) 0.1 % Apply 1 application topically daily as needed. (Patient taking differently: Apply 1 application topically daily as needed. eczema.) 80 g 2   No current facility-administered medications for this visit.    Facility-Administered Medications Ordered in Other Visits  Medication Dose Route Frequency Provider Last Rate Last Dose   cyclophosphamide (CYTOXAN) 1,260 mg in sodium chloride 0.9 % 250 mL chemo infusion  600 mg/m2 (Treatment Plan Recorded) Intravenous Once Jeaneen Cala, Virgie Dad, MD       DOCEtaxel (TAXOTERE) 160 mg in sodium chloride 0.9 % 250 mL chemo infusion  75 mg/m2 (Treatment Plan Recorded) Intravenous Once Tel Hevia, Virgie Dad, MD       heparin lock flush 100 unit/mL  500 Units Intracatheter Once PRN Dawnmarie Breon, Virgie Dad, MD       sodium chloride flush (NS) 0.9 % injection 10 mL  10 mL Intracatheter PRN Ross Hefferan, Virgie Dad, MD        OBJECTIVE: Middle-aged African-American woman examined in the treatment area  For today's labs please consult the treatment area flowsheet  There were no vitals filed for this visit.   There is no height or weight on file to calculate BMI.   Wt Readings from Last 3 Encounters:  10/05/18 217 lb 3.2 oz (98.5 kg)  09/29/18 214 lb 14.4 oz (97.5 kg)  09/27/18 211 lb 12.8 oz (96.1 kg)  ECOG FS:1 - Symptomatic but completely ambulatory  She has the Digna cap in place Sclerae unicteric,  EOMs intact Wearing a mask No cervical or supraclavicular adenopathy Lungs no rales or rhonchi Heart regular rate and rhythm Abd soft, nontender, positive bowel sounds Neuro: nonfocal, well oriented, appropriate affect Breasts: Deferred Skin: The rash is flatter, lighter, and more restricted    LAB RESULTS:  CMP     Component Value Date/Time   NA 141 10/19/2018 0820   K 4.1 10/19/2018 0820   CL 106 10/19/2018 0820   CO2 24 10/19/2018 0820   GLUCOSE 145 (H) 10/19/2018 0820   BUN 23 (H) 10/19/2018 0820   CREATININE 0.92 10/19/2018 0820   CALCIUM 9.8 10/19/2018 0820   PROT 7.8 10/19/2018 0820   ALBUMIN 4.2 10/19/2018 0820   AST 12 (L) 10/19/2018 0820   ALT 25 10/19/2018 0820   ALKPHOS 94 10/19/2018 0820   BILITOT 0.4 10/19/2018 0820   GFRNONAA >60 10/19/2018 0820   GFRAA >60 10/19/2018 0820    No results found for: TOTALPROTELP, ALBUMINELP, A1GS, A2GS, BETS, BETA2SER, GAMS, MSPIKE, SPEI  No results found for: KPAFRELGTCHN, LAMBDASER, KAPLAMBRATIO  Lab Results  Component Value Date   WBC 27.7 (H) 10/19/2018   NEUTROABS 24.6 (H) 10/19/2018   HGB 12.2 10/19/2018   HCT 36.8 10/19/2018   MCV 79.8 (L) 10/19/2018   PLT 230 10/19/2018    _0 @  No results found for: LABCA2  No components found for: YQMVHQ469  No results for input(s): INR in the last 168 hours.  No results found for: LABCA2  No results found for: GEX528  No results found for: UXL244  No results found for: WNU272  No results found for: CA2729  No components found for: HGQUANT  No results found for: CEA1 / No results found for: CEA1   No results found for: AFPTUMOR  No results found for: CHROMOGRNA  No results found for: PSA1  Appointment on 10/19/2018  Component Date Value  Ref Range Status   Sodium 10/19/2018 141  135 - 145 mmol/L Final   Potassium 10/19/2018 4.1  3.5 - 5.1 mmol/L Final   Chloride 10/19/2018 106  98 - 111 mmol/L Final   CO2 10/19/2018 24  22 - 32  mmol/L Final   Glucose, Bld 10/19/2018 145* 70 - 99 mg/dL Final   BUN 10/19/2018 23* 6 - 20 mg/dL Final   Creatinine 10/19/2018 0.92  0.44 - 1.00 mg/dL Final   Calcium 10/19/2018 9.8  8.9 - 10.3 mg/dL Final   Total Protein 10/19/2018 7.8  6.5 - 8.1 g/dL Final   Albumin 10/19/2018 4.2  3.5 - 5.0 g/dL Final   AST 10/19/2018 12* 15 - 41 U/L Final   ALT 10/19/2018 25  0 - 44 U/L Final   Alkaline Phosphatase 10/19/2018 94  38 - 126 U/L Final   Total Bilirubin 10/19/2018 0.4  0.3 - 1.2 mg/dL Final   GFR, Est Non Af Am 10/19/2018 >60  >60 mL/min Final   GFR, Est AFR Am 10/19/2018 >60  >60 mL/min Final   Anion gap 10/19/2018 11  5 - 15 Final   Performed at [facility], [place] [address] [place], [place] [address]   WBC Count 10/19/2018 27.7* 4.0 - 10.5 K/uL Final   RBC 10/19/2018 4.61  3.87 - 5.11 MIL/uL Final   Hemoglobin 10/19/2018 12.2  12.0 - 15.0 g/dL Final   HCT 10/19/2018 36.8  36.0 - 46.0 % Final   MCV 10/19/2018 79.8* 80.0 - 100.0 fL Final   MCH 10/19/2018 26.5  26.0 - 34.0 pg Final   MCHC 10/19/2018 33.2  30.0 - 36.0 g/dL Final   RDW 10/19/2018 14.6  11.5 - 15.5 % Final   Platelet Count 10/19/2018 230  150 - 400 K/uL Final   nRBC 10/19/2018 0.0  0.0 - 0.2 % Final   Neutrophils Relative % 10/19/2018 89  % Final   Neutro Abs 10/19/2018 24.6* 1.7 - 7.7 K/uL Final   Lymphocytes Relative 10/19/2018 6  % Final   Lymphs Abs 10/19/2018 1.8  0.7 - 4.0 K/uL Final   Monocytes Relative 10/19/2018 2  % Final   Monocytes Absolute 10/19/2018 0.5  0.1 - 1.0 K/uL Final   Eosinophils Relative 10/19/2018 0  % Final   Eosinophils Absolute 10/19/2018 0.0  0.0 - 0.5 K/uL Final   Basophils Relative 10/19/2018 0  % Final   Basophils Absolute 10/19/2018 0.1  0.0 - 0.1 K/uL Final   Immature Granulocytes 10/19/2018 3  % Final   Abs Immature Granulocytes 10/19/2018 0.74* 0.00 - 0.07 K/uL Final   Performed at [facility],  [address] [place], [place] [address]    (this displays the last labs from the last 3 days)  No results found for: TOTALPROTELP, ALBUMINELP, A1GS, A2GS, BETS, BETA2SER, GAMS, MSPIKE, SPEI (this displays SPEP labs)  No results found for: KPAFRELGTCHN, LAMBDASER, KAPLAMBRATIO (kappa/lambda light chains)  No results found for: HGBA, HGBA2QUANT, HGBFQUANT, HGBSQUAN (Hemoglobinopathy evaluation)   No results found for: LDH  No results found for: IRON, TIBC, IRONPCTSAT (Iron and TIBC)  No results found for: FERRITIN  Urinalysis    Component Value Date/Time   COLORURINE YELLOW 05/15/2007 0957   APPEARANCEUR CLEAR 05/15/2007 0957   LABSPEC 1.010 05/15/2007 0957   PHURINE 8.0 05/15/2007 0957   GLUCOSEU NEGATIVE 05/15/2007 0957   HGBUR NEGATIVE 05/15/2007 0957   BILIRUBINUR NEGATIVE 05/15/2007 0957   KETONESUR NEGATIVE 05/15/2007 0957   PROTEINUR  NEGATIVE 05/15/2007 0957   UROBILINOGEN 0.2 05/15/2007 0957   NITRITE NEGATIVE 05/15/2007 0957   LEUKOCYTESUR  05/15/2007 0957    NEGATIVE MICROSCOPIC NOT DONE ON URINES WITH NEGATIVE PROTEIN, BLOOD, LEUKOCYTES, NITRITE, OR GLUCOSE <1000 mg/dL.     STUDIES: Dg Chest Port 1 View  Result Date: 09/27/2018 CLINICAL DATA:  Follow-up port placement EXAM: PORTABLE CHEST 1 VIEW COMPARISON:  None. FINDINGS: Cardiac shadows within normal limits. Right-sided chest wall port is noted with the catheter tip at the cavoatrial junction. No pneumothorax is seen. Postsurgical changes in the left axilla are noted. IMPRESSION: No pneumothorax following port placement on the right Electronically Signed   By: Inez Catalina M.D.   On: 09/27/2018 09:46   Dg Fluoro Guide Cv Line-no Report  Result Date: 09/27/2018 Fluoroscopy was utilized by the requesting physician.  No radiographic interpretation.    ELIGIBLE FOR AVAILABLE RESEARCH PROTOCOL: no  ASSESSMENT: 53 y.o.  [place] status post left breast upper outer quadrant biopsy 06/29/2018 for a  clinical T2N0 invasive ductal carcinoma, grade 2, estrogen receptor positive, progesterone receptor negative, with an MIB-1-1 of 10%, and no HER-2 amplification.  (1) genetics 07/18/2018 through the [facility] Common Hereditary Cancers Panel found no deleterious mutations in APC, ATM, AXIN2, BARD1, BMPR1A, BRCA1, BRCA2, BRIP1, CDH1, CDKN2A (p14ARF), CDKN2A (p16INK4a), CKD4, CHEK2, CTNNA1, DICER1, EPCAM (Deletion/duplication testing only), GREM1 (promoter region deletion/duplication testing only), KIT, MEN1, MLH1, MSH2, MSH3, MSH6, MUTYH, NBN, NF1, NHTL1, PALB2, PDGFRA, PMS2, POLD1, POLE, PTEN, RAD50, RAD51C, RAD51D, SDHB, SDHC, SDHD, SMAD4, SMARCA4. STK11, TP53, TSC1, TSC2, and VHL.  The following genes were evaluated for sequence changes only: SDHA and HOXB13 c.251G>A variant only.   (2) status post left lumpectomy and sentinel lymph node sampling 08/22/2018 for a pT2 pN0, stage IB invasive ductal carcinoma, grade 2, with negative margins  (a) a total of 10 axillary lymph nodes were removed  (3) Oncotype score of 34 predicts a risk of recurrence outside the breast in the next 9 years of 22% if the patient's only systemic therapy is antiestrogens for 5 years.  It also predicts significant benefit from chemotherapy.  (4) adjuvant chemotherapy consisting of cyclophosphamide and docetaxel given every 21 days x 4 beginning on 09/29/2018  (5) adjuvant radiation to follow  (6) antiestrogens to start at the completion of local treatment.  PLAN: Veronica Reilly did generally well with her first cycle of chemotherapy with no unusual side effects.  She knows the second cycle will be very similar.  She will take a half a Decadron pill on day 4 and if necessary on day 5.  She will use lorazepam as needed to help with the insomnia.  Her skin rash is much better and we are continuing the Diflucan until she completes chemotherapy.  I encouraged her to continue her excellent exercise program but take it a little bit easy.   I am delighted that she is still able to work, mostly virtually of course  I am adding lab work only in about 10 days just to make sure her nadir counts are adequate.  Otherwise she will see [place] again in 3 weeks for cycle #3.   Chauncey Cruel, MD Medical Oncology and Hematology [facility] [address] [place], [place] [address] Tel. [phone]    Fax. [phone]

## 2018-10-19 NOTE — Telephone Encounter (Signed)
Spoke with patient with her 2nd cycle TC.  She states she is doing well.  Informed her that I have added an appointment with Dr. Jana Hakim for him to see her in the treatment room.  I will also place a schedule message for a provider appointment to be added to her next chemo as well.  Encouraged her to call with any needs or questions.

## 2018-10-19 NOTE — Patient Instructions (Signed)

## 2018-10-20 ENCOUNTER — Telehealth: Payer: Self-pay

## 2018-10-20 NOTE — Telephone Encounter (Signed)
Return TC to pt in regard to her question to [name] about if she could use a drying lamp after polishing her nails at home. I let her know that [name] did not suggest that she use a lamp that has UV light. Patient stated that she would just use a regular fan. No further problems or concerns at this time.

## 2018-10-21 ENCOUNTER — Inpatient Hospital Stay: Payer: BC Managed Care – PPO

## 2018-10-21 ENCOUNTER — Other Ambulatory Visit: Payer: Self-pay

## 2018-10-21 VITALS — BP 144/81 | HR 99 | Temp 96.9°F | Resp 20

## 2018-10-21 DIAGNOSIS — C50412 Malignant neoplasm of upper-outer quadrant of left female breast: Secondary | ICD-10-CM

## 2018-10-21 DIAGNOSIS — Z170 Estrogen receptor positive status [ER+]: Secondary | ICD-10-CM

## 2018-10-21 MED ORDER — PEGFILGRASTIM-JMDB 6 MG/0.6ML ~~LOC~~ SOSY
6.0000 mg | PREFILLED_SYRINGE | Freq: Once | SUBCUTANEOUS | Status: AC
  Administered 2018-10-21: 6 mg via SUBCUTANEOUS

## 2018-10-21 NOTE — Patient Instructions (Signed)

## 2018-10-24 ENCOUNTER — Other Ambulatory Visit: Payer: Self-pay | Admitting: Oncology

## 2018-10-24 DIAGNOSIS — C50412 Malignant neoplasm of upper-outer quadrant of left female breast: Secondary | ICD-10-CM

## 2018-10-24 DIAGNOSIS — Z170 Estrogen receptor positive status [ER+]: Secondary | ICD-10-CM

## 2018-10-25 ENCOUNTER — Encounter: Payer: Self-pay | Admitting: Adult Health

## 2018-10-25 ENCOUNTER — Other Ambulatory Visit: Payer: Self-pay | Admitting: Oncology

## 2018-10-25 DIAGNOSIS — C50412 Malignant neoplasm of upper-outer quadrant of left female breast: Secondary | ICD-10-CM

## 2018-10-25 DIAGNOSIS — Z170 Estrogen receptor positive status [ER+]: Secondary | ICD-10-CM

## 2018-10-25 MED ORDER — LORAZEPAM 0.5 MG PO TABS: 0.5000 mg | ORAL_TABLET | Freq: Every evening | ORAL | 0 refills | Status: DC | PRN

## 2018-10-26 ENCOUNTER — Other Ambulatory Visit: Payer: Self-pay | Admitting: *Deleted

## 2018-10-26 ENCOUNTER — Telehealth: Payer: Self-pay

## 2018-10-26 DIAGNOSIS — C50412 Malignant neoplasm of upper-outer quadrant of left female breast: Secondary | ICD-10-CM

## 2018-10-26 DIAGNOSIS — Z170 Estrogen receptor positive status [ER+]: Secondary | ICD-10-CM

## 2018-10-26 NOTE — Telephone Encounter (Signed)
Called patient after receiving my chart message.  Patient has c/o feeling exhausted and heart rate is elevated.  She reports her HR has been up to 140, 130 and gets winded.  This morning while on the phone with patient HR was 118.  Chemo was on 10/1, patient says it started on day 6 after treatment.  Inquired about patient's water intake during the day and she said "I could be doing better with that.  I usually drink at least 1 liter."  Nurse explained to patient she should be drinking more through out the day and that dehydration can make her feel this way.  Advised patient to monitor HR and increase water intake today and call to let us know if symptoms improve.  Patient is aware that if symptoms do not improve she needs to be seen today.  Also, patient has a lab and flush visit for tomorrow 10/9 but no provider appt.  Nurse let patient know that she will be added on to see Sandi Mealy, PA tomorrow after lab/flush.  Patient voiced understanding and agreed to above.

## 2018-10-27 ENCOUNTER — Inpatient Hospital Stay: Payer: BC Managed Care – PPO

## 2018-10-27 ENCOUNTER — Inpatient Hospital Stay (HOSPITAL_BASED_OUTPATIENT_CLINIC_OR_DEPARTMENT_OTHER): Payer: BC Managed Care – PPO | Admitting: Medical

## 2018-10-27 ENCOUNTER — Other Ambulatory Visit: Payer: Self-pay

## 2018-10-27 ENCOUNTER — Telehealth: Payer: Self-pay | Admitting: *Deleted

## 2018-10-27 ENCOUNTER — Inpatient Hospital Stay: Payer: BC Managed Care – PPO | Admitting: Medical

## 2018-10-27 VITALS — BP 125/75 | HR 103 | Temp 98.9°F | Resp 17 | Ht 66.0 in | Wt 221.8 lb

## 2018-10-27 DIAGNOSIS — C50412 Malignant neoplasm of upper-outer quadrant of left female breast: Secondary | ICD-10-CM

## 2018-10-27 DIAGNOSIS — Z170 Estrogen receptor positive status [ER+]: Secondary | ICD-10-CM

## 2018-10-27 DIAGNOSIS — Z95828 Presence of other vascular implants and grafts: Secondary | ICD-10-CM | POA: Diagnosis not present

## 2018-10-27 LAB — CBC WITH DIFFERENTIAL (CANCER CENTER ONLY)
Abs Immature Granulocytes: 5.1 10*3/uL — ABNORMAL HIGH (ref 0.00–0.07)
Band Neutrophils: 5 %
Basophils Absolute: 0 10*3/uL (ref 0.0–0.1)
Basophils Relative: 0 %
Eosinophils Absolute: 0 10*3/uL (ref 0.0–0.5)
Eosinophils Relative: 0 %
HCT: 35.5 % — ABNORMAL LOW (ref 36.0–46.0)
Hemoglobin: 11.9 g/dL — ABNORMAL LOW (ref 12.0–15.0)
Lymphocytes Relative: 9 %
Lymphs Abs: 4.6 10*3/uL — ABNORMAL HIGH (ref 0.7–4.0)
MCH: 27 pg (ref 26.0–34.0)
MCHC: 33.5 g/dL (ref 30.0–36.0)
MCV: 80.7 fL (ref 80.0–100.0)
Metamyelocytes Relative: 8 %
Monocytes Absolute: 5.6 10*3/uL — ABNORMAL HIGH (ref 0.1–1.0)
Monocytes Relative: 11 %
Myelocytes: 2 %
Neutro Abs: 35.5 10*3/uL — ABNORMAL HIGH (ref 1.7–17.7)
Neutrophils Relative %: 65 %
Platelet Count: 259 10*3/uL (ref 150–400)
RBC: 4.4 MIL/uL (ref 3.87–5.11)
RDW: 14.8 % (ref 11.5–15.5)
WBC Count: 50.7 10*3/uL (ref 4.0–10.5)
nRBC: 0.4 % — ABNORMAL HIGH (ref 0.0–0.2)

## 2018-10-27 LAB — CMP (CANCER CENTER ONLY)
ALT: 28 U/L (ref 0–44)
AST: 18 U/L (ref 15–41)
Albumin: 3.8 g/dL (ref 3.5–5.0)
Alkaline Phosphatase: 159 U/L — ABNORMAL HIGH (ref 38–126)
Anion gap: 9 (ref 5–15)
BUN: 15 mg/dL (ref 6–20)
CO2: 28 mmol/L (ref 22–32)
Calcium: 9.7 mg/dL (ref 8.9–10.3)
Chloride: 101 mmol/L (ref 98–111)
Creatinine: 1.02 mg/dL — ABNORMAL HIGH (ref 0.44–1.00)
GFR, Est AFR Am: >60 mL/min (ref >60)
GFR, Estimated: >60 mL/min (ref >60)
Glucose, Bld: 94 mg/dL (ref 70–99)
Potassium: 4 mmol/L (ref 3.5–5.1)
Sodium: 138 mmol/L (ref 135–145)
Total Bilirubin: 0.3 mg/dL (ref 0.3–1.2)
Total Protein: 7.2 g/dL (ref 6.5–8.1)

## 2018-10-27 MED ORDER — HEPARIN SOD (PORK) LOCK FLUSH 100 UNIT/ML IV SOLN
500.0000 [IU] | Freq: Once | INTRAVENOUS | Status: AC | PRN
  Administered 2018-10-27: 500 [IU]
  Filled 2018-10-27: qty 5

## 2018-10-27 MED ORDER — SODIUM CHLORIDE 0.9% FLUSH
10.0000 mL | INTRAVENOUS | Status: DC | PRN
  Administered 2018-10-27: 10 mL
  Filled 2018-10-27: qty 10

## 2018-10-27 NOTE — Patient Instructions (Signed)
COVID-19: How to Protect Yourself and Others Know how it spreads  There is currently no vaccine to prevent coronavirus disease 2019 (COVID-19).  The best way to prevent illness is to avoid being exposed to this virus.  The virus is thought to spread mainly from person-to-person. ? Between people who are in close contact with one another (within about 6 feet). ? Through respiratory droplets produced when an infected person coughs, sneezes or talks. ? These droplets can land in the mouths or noses of people who are nearby or possibly be inhaled into the lungs. ? Some recent studies have suggested that COVID-19 may be spread by people who are not showing symptoms. Everyone should Clean your hands often  Wash your hands often with soap and water for at least 20 seconds especially after you have been in a public place, or after blowing your nose, coughing, or sneezing.  If soap and water are not readily available, use a hand sanitizer that contains at least 60% alcohol. Cover all surfaces of your hands and rub them together until they feel dry.  Avoid touching your eyes, nose, and mouth with unwashed hands. Avoid close contact  Stay home if you are sick.  Avoid close contact with people who are sick.  Put distance between yourself and other people. ? Remember that some people without symptoms may be able to spread virus. ? This is especially important for people who are at higher risk of getting very sick.www.cdc.gov/coronavirus/2019-ncov/need-extra-precautions/people-at-higher-risk.html Cover your mouth and nose with a cloth face cover when around others  You could spread COVID-19 to others even if you do not feel sick.  Everyone should wear a cloth face cover when they have to go out in public, for example to the grocery store or to pick up other necessities. ? Cloth face coverings should not be placed on young children under age 2, anyone who has trouble breathing, or is unconscious,  incapacitated or otherwise unable to remove the mask without assistance.  The cloth face cover is meant to protect other people in case you are infected.  Do NOT use a facemask meant for a healthcare worker.  Continue to keep about 6 feet between yourself and others. The cloth face cover is not a substitute for social distancing. Cover coughs and sneezes  If you are in a private setting and do not have on your cloth face covering, remember to always cover your mouth and nose with a tissue when you cough or sneeze or use the inside of your elbow.  Throw used tissues in the trash.  Immediately wash your hands with soap and water for at least 20 seconds. If soap and water are not readily available, clean your hands with a hand sanitizer that contains at least 60% alcohol. Clean and disinfect  Clean AND disinfect frequently touched surfaces daily. This includes tables, doorknobs, light switches, countertops, handles, desks, phones, keyboards, toilets, faucets, and sinks. www.cdc.gov/coronavirus/2019-ncov/prevent-getting-sick/disinfecting-your-home.html  If surfaces are dirty, clean them: Use detergent or soap and water prior to disinfection.  Then, use a household disinfectant. You can see a list of EPA-registered household disinfectants here. cdc.gov/coronavirus 05/23/2018 This information is not intended to replace advice given to you by your health care provider. Make sure you discuss any questions you have with your health care provider. Document Released: 05/02/2018 Document Revised: 05/31/2018 Document Reviewed: 05/02/2018 Elsevier Patient Education  2020 Elsevier Inc.  

## 2018-10-27 NOTE — Progress Notes (Signed)
[facility]pt[facility] Progress Note   Veronica Reilly [id number] 1965/09/19 53 y.o.  Veronica Reilly is managed by Dr. Lurline Del  Actively treated with chemotherapy/immunotherapy/hormonal therapy: yes  Current therapy: Cytoxan and Taxotere with Neulasta support  Last treated: 10/19/2018 (cycle 2, day 1)  Next scheduled appointment with provider: 11/09/2018  Assessment: Plan:    Port-A-Cath in place - Plan: SCHEDULING COMMUNICATION INJECTION, heparin lock flush 100 unit/mL, sodium chloride flush (NS) 0.9 % injection 10 mL  Malignant neoplasm of upper-outer quadrant of left breast in female, estrogen receptor positive ([place])   ER positive malignant neoplasm of the left breast with recent dyspnea on exertion, fatigue, and palpitations: The patient presents to clinic today stating that she is feeling significantly better after pushing fluids yesterday.  Her labs returned today showing a creatinine slightly elevated at 1.02 with an alkaline phosphatase slightly elevated at 159.  Her CBC returned showing a WBC of 50.7 with an [place] of 35.5.  Her hemoglobin returned at 11.9 with a hematocrit of 35.5.  She denies any findings that would indicate infection.  We discussed that her leukocytosis was likely related to dosing with Neulasta.  Please see After Visit Summary for patient specific instructions.  Future Appointments  Date Time Provider [place]  11/09/2018  8:00 AM [facility] 4 [facility] None  11/09/2018  8:15 AM [facility] [place] FLUSH [facility] None  11/09/2018  8:30 AM Causey, Charlestine Massed, NP [facility] None  11/09/2018  9:30 AM [facility] INFUSION [facility] None  11/11/2018 12:00 PM [facility] FLUSH [facility] None    Orders Placed This Encounter  Procedures  . SCHEDULING COMMUNICATION INJECTION       Subjective:   Patient ID:  Veronica Reilly is a 53 y.o. (DOB Feb 14, 1965) female.  Chief Complaint:  Chief Complaint  Patient  presents with  . Tachycardia    HPI Veronica Reilly  Is a 53 y.o. female with a diagnosis of an ER positive malignant neoplasm of the left breast.  She is followed by Dr. Jana Hakim and is status post cycle 2, day 1 of Cytoxan and Taxotere which was dosed on 10/19/2018.  She contacted our office yesterday stating that her heart rate was elevated and was reported to be from 130 to 140 bpm.  She states today that her heart rate on Wednesday went up to 120.  She has drank less liquids after this last chemotherapy.  She reports that her husband who is a nurse told her to push fluids yesterday as she could be dehydrated.  She was also experiencing some dyspnea on exertion and fatigue.  She denies nausea, vomiting, diarrhea, urinary frequency, dysuria, or chest pain.  She states today that she is feeling significantly better after drinking more liquids yesterday.   Medications: I have reviewed the patient's current medications.  Allergies:  Allergies  Allergen Reactions  . Apple Anaphylaxis  . Iohexol     Contrast dye  . Other Shortness Of Breath    CHERRIES, TREES, MOLD,DUST, COCKROACHES  . Povidone-Iodine Anaphylaxis  . Shellfish Allergy Anaphylaxis  . Peach [Prunus Persica] Itching and Hives    mouth  . Zocor [Simvastatin] Other (See Comments)    Alopecia  . Betadine [Povidone Iodine]     Due to shellfish allergy  . Latex Rash    Past Medical History:  Diagnosis Date  . ASCUS (atypical squamous cells of undetermined significance) on Pap smear   . Asthma    controlled with daily inhaler  .  Breast cancer ([place]) 06/29/2018   Left IDC  . Dysplasia of cervix, low grade (CIN 1)   . Eczema   . Family history of breast cancer   . Family history of lung cancer   . Family history of prostate cancer   . GERD (gastroesophageal reflux disease)   . Heart murmur   . History of chicken pox   . Hypertension   . Hypothyroid   . Uterine fibroid     Past Surgical History:  Procedure  Laterality Date  . ADENOIDECTOMY    . BREAST LUMPECTOMY WITH RADIOACTIVE SEED AND SENTINEL LYMPH NODE BIOPSY Left 08/22/2018   Procedure: LEFT BREAST LUMPECTOMY WITH RADIOACTIVE SEED AND SENTINEL LYMPH NODE MAPPING;  Surgeon: Erroll Luna, MD;  Location: [place];  Service: General;  Laterality: Left;  . LEEP    . MYOMECTOMY    . PORTACATH PLACEMENT Right 09/27/2018   Procedure: INSERTION PORT-A-CATH WITH ULTRASOUND;  Surgeon: Erroll Luna, MD;  Location: [place];  Service: General;  Laterality: Right;  . TONSILECTOMY, ADENOIDECTOMY, BILATERAL MYRINGOTOMY AND TUBES    . TONSILLECTOMY      Family History  Problem Relation Age of Onset  . Breast cancer Maternal Grandmother   . Heart disease Father   . Diabetes Father   . Prostate cancer Father 58  . Breast cancer Mother 64  . Hypertension Other   . Lung cancer Maternal Aunt   . Allergic rhinitis Neg Hx   . Angioedema Neg Hx   . Asthma Neg Hx   . Eczema Neg Hx   . Immunodeficiency Neg Hx   . Urticaria Neg Hx     Social History   Socioeconomic History  . Marital status: Married    Spouse name: Not on file  . Number of children: Not on file  . Years of education: Not on file  . Highest education level: Not on file  Occupational History  . Not on file  Social Needs  . Financial resource strain: Not on file  . Food insecurity    Worry: Not on file    Inability: Not on file  . Transportation needs    Medical: No    Non-medical: No  Tobacco Use  . Smoking status: Never Smoker  . Smokeless tobacco: Never Used  Substance and Sexual Activity  . Alcohol use: No  . Drug use: No  . Sexual activity: Yes    Birth control/protection: None  Lifestyle  . Physical activity    Days per week: Not on file    Minutes per session: Not on file  . Stress: Not on file  Relationships  . Social Herbalist on phone: Not on file    Gets together: Not on file    Attends religious service: Not on file     Active member of club or organization: Not on file    Attends meetings of clubs or organizations: Not on file    Relationship status: Not on file  . Intimate partner violence    Fear of current or ex partner: Not on file    Emotionally abused: Not on file    Physically abused: Not on file    Forced sexual activity: Not on file  Other Topics Concern  . Not on file  Social History Narrative  . Not on file    Past Medical History, Surgical history, Social history, and Family history were reviewed and updated as appropriate.   Please see review of  systems for further details on the patient's review from today.   Review of Systems:  Review of Systems  Constitutional: Positive for appetite change and fatigue. Negative for chills, diaphoresis and fever.  HENT: Negative for dental problem, mouth sores and trouble swallowing.   Respiratory: Positive for shortness of breath. Negative for cough and chest tightness.   Cardiovascular: Positive for palpitations. Negative for chest pain.  Gastrointestinal: Negative for constipation, diarrhea, nausea and vomiting.  Neurological: Negative for dizziness, syncope, weakness and headaches.    Objective:   Physical Exam:  BP 125/75 (BP Location: Right Arm, Patient Position: Sitting)   Pulse (!) 103   Temp 98.9 F (37.2 C) (Temporal)   Resp 17   Ht 5\' 6"  (1.676 m)   Wt 221 lb 12.8 oz (100.6 kg)   SpO2 100%   BMI 35.80 kg/m  ECOG: 0  Physical Exam Constitutional:      General: She is not in acute distress.    Appearance: She is not diaphoretic.  HENT:     Head: Normocephalic and atraumatic.  Eyes:     General: No scleral icterus.       Right eye: No discharge.        Left eye: No discharge.  Cardiovascular:     Rate and Rhythm: Normal rate and regular rhythm.     Heart sounds: Normal heart sounds. No murmur. No friction rub. No gallop.      Comments: An accessed right chest wall Port-A-Cath was noted. Pulmonary:     Effort:  Pulmonary effort is normal. No respiratory distress.     Breath sounds: Normal breath sounds. No wheezing or rales.  Skin:    General: Skin is warm and dry.     Findings: No erythema or rash.  Neurological:     Mental Status: She is alert.     Gait: Gait normal.  Psychiatric:        Mood and Affect: Mood normal.        Behavior: Behavior normal.        Thought Content: Thought content normal.        Judgment: Judgment normal.     Lab Review:     Component Value Date/Time   NA 138 10/27/2018 1305   K 4.0 10/27/2018 1305   CL 101 10/27/2018 1305   CO2 28 10/27/2018 1305   GLUCOSE 94 10/27/2018 1305   BUN 15 10/27/2018 1305   CREATININE 1.02 (H) 10/27/2018 1305   CALCIUM 9.7 10/27/2018 1305   PROT 7.2 10/27/2018 1305   ALBUMIN 3.8 10/27/2018 1305   AST 18 10/27/2018 1305   ALT 28 10/27/2018 1305   ALKPHOS 159 (H) 10/27/2018 1305   BILITOT 0.3 10/27/2018 1305   GFRNONAA >60 10/27/2018 1305   GFRAA >60 10/27/2018 1305       Component Value Date/Time   WBC 50.7 (HH) 10/27/2018 1305   WBC 8.0 09/20/2018 0945   RBC 4.40 10/27/2018 1305   HGB 11.9 (L) 10/27/2018 1305   HCT 35.5 (L) 10/27/2018 1305   PLT 259 10/27/2018 1305   MCV 80.7 10/27/2018 1305   MCH 27.0 10/27/2018 1305   MCHC 33.5 10/27/2018 1305   RDW 14.8 10/27/2018 1305   LYMPHSABS 4.6 (H) 10/27/2018 1305   MONOABS 5.6 (H) 10/27/2018 1305   EOSABS 0.0 10/27/2018 1305   BASOSABS 0.0 10/27/2018 1305   -------------------------------  Imaging from last 24 hours (if applicable):  Radiology interpretation: No results found.

## 2018-10-27 NOTE — Progress Notes (Deleted)
Symptoms Management Clinic Progress Note   Veronica Reilly [id number] [birth year] 53 y.o.  Veronica Reilly is managed by Dr. Lurline Del  Actively treated with chemotherapy/immunotherapy/hormonal therapy: yes  Current therapy: cyclophosphamide and docetaxel   Last treated: 10/19/2018 (cycle 2, day 1)  Next scheduled appointment with provider: 10/27/2018  Assessment: Plan:    Malignant neoplasm of upper-outer quadrant of left breast in female, estrogen receptor positive ([place])   ER positive malignant neoplasm of the left breast: The patient continues to be followed by Dr. Jana Hakim and is status post cycle 2, day 1 of cyclophosphamide and docetaxel which was dosed on 10/19/2018.  She has a follow-up appointment on 10/27/2018.  Please see After Visit Summary for patient specific instructions.  Future Appointments  Date Time Provider [place]  10/27/2018  3:00 PM [facility] LAB 6 [facility] None  10/27/2018  3:15 PM [facility] [place] None  10/27/2018  3:30 PM Arcadio Cope, Lucianne Lei E., PA-C [facility] None  11/09/2018  8:00 AM [facility] LAB 4 [facility] None  11/09/2018  8:15 AM [facility] [place] FLUSH [facility] None  11/09/2018  8:30 AM Causey, Charlestine Massed, NP [facility] None  11/09/2018  9:30 AM [facility] INFUSION [facility] None  11/11/2018 12:00 PM [facility] [place] FLUSH [facility] None    No orders of the defined types were placed in this encounter.      Subjective:   Patient ID:  Veronica Reilly is a 53 y.o. (DOB October 27, 1965) female.  Chief Complaint: No chief complaint on file.   HPI Veronica Reilly  Is a 53 y.o. female with a diagnosis of an ER positive malignant neoplasm of the left breast. She continues to be followed by Dr. Jana Hakim and is status post cycle 2, day 1 of cyclophosphamide and docetaxel which was dosed on 10/19/2018.  The patient contacted our office yesterday stating that she was feeling exhausted and noted that her heart  rate was elevated.  Her heart rate had been as high as 140.  She also reported shortness of breath.  She reported that she was drinking but thought that she could do better with her intake of fluids.  She was told that she needed to come in yesterday if her symptoms did not improve.  She has an appointment for labs and she be seen in the flush clinic today.  Medications: I have reviewed the patient's current medications.  Allergies:  Allergies  Allergen Reactions   Apple Anaphylaxis   Iohexol     Contrast dye   Other Shortness Of Breath    CHERRIES, TREES, MOLD,DUST, COCKROACHES   Povidone-Iodine Anaphylaxis   Shellfish Allergy Anaphylaxis   Peach [Prunus Persica] Itching and Hives    mouth   Zocor [Simvastatin] Other (See Comments)    Alopecia   Betadine [Povidone Iodine]     Due to shellfish allergy   Latex Rash    Past Medical History:  Diagnosis Date   ASCUS (atypical squamous cells of undetermined significance) on Pap smear    Asthma    controlled with daily inhaler   Breast cancer ([place]) 06/29/2018   Left IDC   Dysplasia of cervix, low grade (CIN 1)    Eczema    Family history of breast cancer    Family history of lung cancer    Family history of prostate cancer    GERD (gastroesophageal reflux disease)    Heart murmur    History of chicken pox    Hypertension    Hypothyroid  Uterine fibroid     Past Surgical History:  Procedure Laterality Date   ADENOIDECTOMY     BREAST LUMPECTOMY WITH RADIOACTIVE SEED AND SENTINEL LYMPH NODE BIOPSY Left 08/22/2018   Procedure: LEFT BREAST LUMPECTOMY WITH RADIOACTIVE SEED AND SENTINEL LYMPH NODE MAPPING;  Surgeon: Erroll Luna, MD;  Location: [facility];  Service: General;  Laterality: Left;   LEEP     MYOMECTOMY     PORTACATH PLACEMENT Right 09/27/2018   Procedure: INSERTION PORT-A-CATH WITH ULTRASOUND;  Surgeon: Erroll Luna, MD;  Location: [place];  Service: General;  Laterality:  Right;   TONSILECTOMY, ADENOIDECTOMY, BILATERAL MYRINGOTOMY AND TUBES     TONSILLECTOMY      Family History  Problem Relation Age of Onset   Breast cancer Maternal Grandmother    Heart disease Father    Diabetes Father    Prostate cancer Father 27   Breast cancer Mother 71   Hypertension Other    Lung cancer Maternal Aunt    Allergic rhinitis Neg Hx    Angioedema Neg Hx    Asthma Neg Hx    Eczema Neg Hx    Immunodeficiency Neg Hx    Urticaria Neg Hx     Social History   Socioeconomic History   Marital status: Married    Spouse name: Not on file   Number of children: Not on file   Years of education: Not on file   Highest education level: Not on file  Occupational History   Not on file  Social Needs   Financial resource strain: Not on file   Food insecurity    Worry: Not on file    Inability: Not on file   Transportation needs    Medical: No    Non-medical: No  Tobacco Use   Smoking status: Never Smoker   Smokeless tobacco: Never Used  Substance and Sexual Activity   Alcohol use: No   Drug use: No   Sexual activity: Yes    Birth control/protection: None  Lifestyle   Physical activity    Days per week: Not on file    Minutes per session: Not on file   Stress: Not on file  Relationships   Social connections    Talks on phone: Not on file    Gets together: Not on file    Attends religious service: Not on file    Active member of club or organization: Not on file    Attends meetings of clubs or organizations: Not on file    Relationship status: Not on file   Intimate partner violence    Fear of current or ex partner: Not on file    Emotionally abused: Not on file    Physically abused: Not on file    Forced sexual activity: Not on file  Other Topics Concern   Not on file  Social History Narrative   Not on file    Past Medical History, Surgical history, Social history, and Family history were reviewed and updated as  appropriate.   Please see review of systems for further details on the patient's review from today.   Review of Systems:  Review of Systems  Constitutional: Positive for fatigue. Negative for appetite change, chills, diaphoresis and fever.  HENT: Negative for dental problem, mouth sores and trouble swallowing.   Respiratory: Negative for cough, chest tightness and shortness of breath.   Cardiovascular: Positive for palpitations. Negative for chest pain.  Gastrointestinal: Negative for constipation, diarrhea, nausea and vomiting.  Neurological: Negative for dizziness, syncope, weakness and headaches.    Objective:   Physical Exam:  There were no vitals taken for this visit. ECOG: 1  Physical Exam  Lab Review:     Component Value Date/Time   NA 141 10/19/2018 0820   K 4.1 10/19/2018 0820   CL 106 10/19/2018 0820   CO2 24 10/19/2018 0820   GLUCOSE 145 (H) 10/19/2018 0820   BUN 23 (H) 10/19/2018 0820   CREATININE 0.92 10/19/2018 0820   CALCIUM 9.8 10/19/2018 0820   PROT 7.8 10/19/2018 0820   ALBUMIN 4.2 10/19/2018 0820   AST 12 (L) 10/19/2018 0820   ALT 25 10/19/2018 0820   ALKPHOS 94 10/19/2018 0820   BILITOT 0.4 10/19/2018 0820   GFRNONAA >60 10/19/2018 0820   GFRAA >60 10/19/2018 0820       Component Value Date/Time   WBC 27.7 (H) 10/19/2018 0820   WBC 8.0 09/20/2018 0945   RBC 4.61 10/19/2018 0820   HGB 12.2 10/19/2018 0820   HCT 36.8 10/19/2018 0820   PLT 230 10/19/2018 0820   MCV 79.8 (L) 10/19/2018 0820   MCH 26.5 10/19/2018 0820   MCHC 33.2 10/19/2018 0820   RDW 14.6 10/19/2018 0820   LYMPHSABS 1.8 10/19/2018 0820   MONOABS 0.5 10/19/2018 0820   EOSABS 0.0 10/19/2018 0820   BASOSABS 0.1 10/19/2018 0820   -------------------------------  Imaging from last 24 hours (if applicable):  Radiology interpretation: No results found.

## 2018-10-27 NOTE — Telephone Encounter (Signed)
Received critical value.  WBC 50.7.  Message given to Marlon Pel, RN with Dr. Jana Hakim @ [phone]

## 2018-10-28 ENCOUNTER — Encounter: Payer: Self-pay | Admitting: Adult Health

## 2018-10-30 ENCOUNTER — Other Ambulatory Visit: Payer: Self-pay | Admitting: Oncology

## 2018-10-30 ENCOUNTER — Encounter: Payer: Self-pay | Admitting: Adult Health

## 2018-11-07 ENCOUNTER — Encounter: Payer: Self-pay | Admitting: Adult Health

## 2018-11-08 ENCOUNTER — Other Ambulatory Visit: Payer: Self-pay | Admitting: *Deleted

## 2018-11-08 DIAGNOSIS — Z170 Estrogen receptor positive status [ER+]: Secondary | ICD-10-CM

## 2018-11-08 DIAGNOSIS — C50412 Malignant neoplasm of upper-outer quadrant of left female breast: Secondary | ICD-10-CM

## 2018-11-09 ENCOUNTER — Other Ambulatory Visit: Payer: Self-pay | Admitting: *Deleted

## 2018-11-09 ENCOUNTER — Inpatient Hospital Stay: Payer: BC Managed Care – PPO

## 2018-11-09 ENCOUNTER — Inpatient Hospital Stay: Payer: BC Managed Care – PPO | Admitting: Adult Health

## 2018-11-09 ENCOUNTER — Other Ambulatory Visit: Payer: Self-pay

## 2018-11-09 ENCOUNTER — Encounter: Payer: Self-pay | Admitting: Adult Health

## 2018-11-09 ENCOUNTER — Other Ambulatory Visit: Payer: BC Managed Care – PPO

## 2018-11-09 VITALS — HR 105

## 2018-11-09 VITALS — BP 139/65 | HR 120 | Temp 98.7°F | Resp 18 | Ht 66.0 in | Wt 225.0 lb

## 2018-11-09 DIAGNOSIS — C50412 Malignant neoplasm of upper-outer quadrant of left female breast: Secondary | ICD-10-CM

## 2018-11-09 DIAGNOSIS — Z170 Estrogen receptor positive status [ER+]: Secondary | ICD-10-CM | POA: Diagnosis not present

## 2018-11-09 DIAGNOSIS — Z95828 Presence of other vascular implants and grafts: Secondary | ICD-10-CM

## 2018-11-09 LAB — CMP (CANCER CENTER ONLY)
ALT: 17 U/L (ref 0–44)
AST: 8 U/L — ABNORMAL LOW (ref 15–41)
Albumin: 3.7 g/dL (ref 3.5–5.0)
Alkaline Phosphatase: 86 U/L (ref 38–126)
Anion gap: 13 (ref 5–15)
BUN: 22 mg/dL — ABNORMAL HIGH (ref 6–20)
CO2: 23 mmol/L (ref 22–32)
Calcium: 9.3 mg/dL (ref 8.9–10.3)
Chloride: 105 mmol/L (ref 98–111)
Creatinine: 0.95 mg/dL (ref 0.44–1.00)
GFR, Est AFR Am: >60 mL/min (ref >60)
GFR, Estimated: >60 mL/min (ref >60)
Glucose, Bld: 207 mg/dL — ABNORMAL HIGH (ref 70–99)
Potassium: 4 mmol/L (ref 3.5–5.1)
Sodium: 141 mmol/L (ref 135–145)
Total Bilirubin: 0.4 mg/dL (ref 0.3–1.2)
Total Protein: 7.2 g/dL (ref 6.5–8.1)

## 2018-11-09 LAB — CBC WITH DIFFERENTIAL (CANCER CENTER ONLY)
Abs Immature Granulocytes: 0.14 10*3/uL — ABNORMAL HIGH (ref 0.00–0.07)
Basophils Absolute: 0 10*3/uL (ref 0.0–0.1)
Basophils Relative: 0 %
Eosinophils Absolute: 0 10*3/uL (ref 0.0–0.5)
Eosinophils Relative: 0 %
HCT: 32.9 % — ABNORMAL LOW (ref 36.0–46.0)
Hemoglobin: 10.9 g/dL — ABNORMAL LOW (ref 12.0–15.0)
Immature Granulocytes: 1 %
Lymphocytes Relative: 7 %
Lymphs Abs: 1.2 10*3/uL (ref 0.7–4.0)
MCH: 26.7 pg (ref 26.0–34.0)
MCHC: 33.1 g/dL (ref 30.0–36.0)
MCV: 80.6 fL (ref 80.0–100.0)
Monocytes Absolute: 0.5 10*3/uL (ref 0.1–1.0)
Monocytes Relative: 3 %
Neutro Abs: 15.1 10*3/uL — ABNORMAL HIGH (ref 1.7–7.7)
Neutrophils Relative %: 89 %
Platelet Count: 247 10*3/uL (ref 150–400)
RBC: 4.08 MIL/uL (ref 3.87–5.11)
RDW: 15.4 % (ref 11.5–15.5)
WBC Count: 17 10*3/uL — ABNORMAL HIGH (ref 4.0–10.5)
nRBC: 0 % (ref 0.0–0.2)

## 2018-11-09 LAB — FERRITIN: Ferritin: 143 ng/mL (ref 11–307)

## 2018-11-09 MED ORDER — PALONOSETRON HCL INJECTION 0.25 MG/5ML
INTRAVENOUS | Status: AC
  Filled 2018-11-09: qty 5

## 2018-11-09 MED ORDER — SODIUM CHLORIDE 0.9 % IV SOLN
Freq: Once | INTRAVENOUS | Status: AC
  Administered 2018-11-09: 10:00:00 via INTRAVENOUS
  Filled 2018-11-09: qty 250

## 2018-11-09 MED ORDER — DEXAMETHASONE SODIUM PHOSPHATE 10 MG/ML IJ SOLN
10.0000 mg | Freq: Once | INTRAMUSCULAR | Status: AC
  Administered 2018-11-09: 10 mg via INTRAVENOUS

## 2018-11-09 MED ORDER — SODIUM CHLORIDE 0.9% FLUSH
10.0000 mL | Freq: Once | INTRAVENOUS | Status: AC
  Administered 2018-11-09: 10 mL
  Filled 2018-11-09: qty 10

## 2018-11-09 MED ORDER — PALONOSETRON HCL INJECTION 0.25 MG/5ML
0.2500 mg | Freq: Once | INTRAVENOUS | Status: AC
  Administered 2018-11-09: 0.25 mg via INTRAVENOUS

## 2018-11-09 MED ORDER — SODIUM CHLORIDE 0.9 % IV SOLN
600.0000 mg/m2 | Freq: Once | INTRAVENOUS | Status: AC
  Administered 2018-11-09: 12:00:00 1260 mg via INTRAVENOUS
  Filled 2018-11-09: qty 63

## 2018-11-09 MED ORDER — DEXAMETHASONE SODIUM PHOSPHATE 10 MG/ML IJ SOLN
INTRAMUSCULAR | Status: AC
  Filled 2018-11-09: qty 1

## 2018-11-09 MED ORDER — SODIUM CHLORIDE 0.9% FLUSH
10.0000 mL | INTRAVENOUS | Status: DC | PRN
  Administered 2018-11-09: 10 mL
  Filled 2018-11-09: qty 10

## 2018-11-09 MED ORDER — SODIUM CHLORIDE 0.9 % IV SOLN
75.0000 mg/m2 | Freq: Once | INTRAVENOUS | Status: AC
  Administered 2018-11-09: 160 mg via INTRAVENOUS
  Filled 2018-11-09: qty 16

## 2018-11-09 MED ORDER — HEPARIN SOD (PORK) LOCK FLUSH 100 UNIT/ML IV SOLN
500.0000 [IU] | Freq: Once | INTRAVENOUS | Status: AC | PRN
  Administered 2018-11-09: 500 [IU]
  Filled 2018-11-09: qty 5

## 2018-11-09 NOTE — Progress Notes (Signed)
Per Wilber Bihari, NP okay for patient to receive treatment today with heart rate of 105.

## 2018-11-09 NOTE — Patient Instructions (Signed)
[facility] Discharge Instructions for Patients Receiving Chemotherapy  Today you received the following chemotherapy agents: Taxotere/Cytoxan.  To help prevent nausea and vomiting after your treatment, we encourage you to take your nausea medication as directed.   If you develop nausea and vomiting that is not controlled by your nausea medication, call the clinic.   BELOW ARE SYMPTOMS THAT SHOULD BE REPORTED IMMEDIATELY:  *FEVER GREATER THAN 100.5 F  *CHILLS WITH OR WITHOUT FEVER  NAUSEA AND VOMITING THAT IS NOT CONTROLLED WITH YOUR NAUSEA MEDICATION  *UNUSUAL SHORTNESS OF BREATH  *UNUSUAL BRUISING OR BLEEDING  TENDERNESS IN MOUTH AND THROAT WITH OR WITHOUT PRESENCE OF ULCERS  *URINARY PROBLEMS  *BOWEL PROBLEMS  UNUSUAL RASH Items with * indicate a potential emergency and should be followed up as soon as possible.  Feel free to call the clinic should you have any questions or concerns. The clinic phone number is [phone].  Please show the CHEMO ALERT CARD at check-in to the Emergency Department and triage nurse.   

## 2018-11-09 NOTE — Progress Notes (Signed)
Veronica Reilly  [phone] [phone]     ID: Veronica Reilly DOB: September 06, 1965  MR#: [id number]  HEN#:[id number]  Patient Care Team: Veronica Cha, MD as PCP - General (Internal Medicine) Magrinat, Virgie Dad, MD as Consulting Physician (Oncology) Veronica Rudd, MD as Consulting Physician (Radiation Oncology) Veronica Luna, MD as Consulting Physician (General Surgery) Veronica Bern, MD as Consulting Physician (Obstetrics and Gynecology) Veronica Gain, MD as Consulting Physician (Allergy) Veronica Rend, MD as Consulting Physician (Endocrinology) Veronica Germany, RN as Oncology Nurse Navigator Veronica Kaufmann, RN as Oncology Nurse Navigator Veronica Dock, NP OTHER MD:  CHIEF COMPLAINT: estrogen receptor positive breast cancer  CURRENT TREATMENT: Adjuvant chemotherapy  INTERVAL HISTORY: Veronica Reilly returns today for continuing adjuvant chemotherapy for her estrogen receptor positive breast cancer.  Today is day 1 cycle 3 of cyclophosphamide/docetaxel which she receives every 21 days, with a total of 4 cycles planned.  REVIEW OF SYSTEMS: Veronica Reilly is doing well today.  She notes that after her injection after cycle 2, she felt dehydrated.  She is feeling better today.  She has some hyperpigmentation underneath her right arm, but no rash or itching.  She has no peripheral neuropathy.  She notes after she takes the dexamethasone she has heart racing, and feels jittery.  She denies any gi distress.  Veronica Reilly denies fever, chills, chest pain, palpitations, cough, shortness of breath, nausea, vomiting, bowel/bladder changes, or any other concerns.  A detailed ROS was otherwise non contributory.    HISTORY OF CURRENT ILLNESS: From the original intake note:  Veronica Reilly had some calcifications in the left breast noted March 2019 on screening mammography.. She underwent biopsy of the left retroareolar area of concern on 03/18/2017 with pathology  ([id number]) showing fibrocystic changes with no malignancy. Return in 6 months for left diagnostic mammography and ultrasonography was recommended, and she reports this took place in November 2019 at her gynecologist's office and the changes previously noted were stable.  She underwent bilateral diagnostic mammography with tomography and left breast ultrasonography at [facility] on 06/27/2018 showing: breast density category C; left breast upper-outer quadrant 3.1 cm group of indeterminate calcifications; persistent benign-appearing cyst in the subareolar left breast, post core needle biopsy changes.  On physical exam there were no palpable masses.  Targeted ultrasound revealed no suspicious masses.  There was a left subareolar breast cyst measuring 1.0 cm.  Left axilla was unremarkable.  Accordingly on 06/29/2018 she proceeded to biopsy of the left breast calcifications area in question. The pathology from this procedure ([id number]) showed: invasive ductal carcinoma, grade 2, with ductal carcinoma in situ. Prognostic indicators significant for: estrogen receptor, 100% positive with strong staining intensity and progesterone receptor, 0% negative. Proliferation marker Ki67 at 10%. HER2 negtive by immunohistochemistry, (0).  The patient's subsequent history is as detailed below.   PAST MEDICAL HISTORY: Past Medical History:  Diagnosis Date  . ASCUS (atypical squamous cells of undetermined significance) on Pap smear   . Asthma    controlled with daily inhaler  . Breast cancer ([place]) 06/29/2018   Left IDC  . Dysplasia of cervix, low grade (CIN 1)   . Eczema   . Family history of breast cancer   . Family history of lung cancer   . Family history of prostate cancer   . GERD (gastroesophageal reflux disease)   . Heart murmur   . History of chicken pox   . Hypertension   . Hypothyroid   . Uterine fibroid  Heart murmur, evaluated by cardiologist   PAST SURGICAL HISTORY: Past Surgical  History:  Procedure Laterality Date  . ADENOIDECTOMY    . BREAST LUMPECTOMY WITH RADIOACTIVE SEED AND SENTINEL LYMPH NODE BIOPSY Left 08/22/2018   Procedure: LEFT BREAST LUMPECTOMY WITH RADIOACTIVE SEED AND SENTINEL LYMPH NODE MAPPING;  Surgeon: Veronica Luna, MD;  Location: [facility];  Service: General;  Laterality: Left;  . LEEP    . MYOMECTOMY    . PORTACATH PLACEMENT Right 09/27/2018   Procedure: INSERTION PORT-A-CATH WITH ULTRASOUND;  Surgeon: Veronica Luna, MD;  Location: [facility];  Service: General;  Laterality: Right;  . TONSILECTOMY, ADENOIDECTOMY, BILATERAL MYRINGOTOMY AND TUBES    . TONSILLECTOMY      FAMILY HISTORY: Family History  Problem Relation Age of Onset  . Breast cancer Maternal Grandmother   . Heart disease Father   . Diabetes Father   . Prostate cancer Father 21  . Breast cancer Mother 44  . Hypertension Other   . Lung cancer Maternal Aunt   . Allergic rhinitis Neg Hx   . Angioedema Neg Hx   . Asthma Neg Hx   . Eczema Neg Hx   . Immunodeficiency Neg Hx   . Urticaria Neg Hx    Patient's father was 89 years old when he died from diabetes and renal failure. Patient's mother died from breast cancer at age 65. The patient notes a family hx of breast and possibly ovarian cancer. Her mother was diagnosed with inflammatory breast cancer at around age 37 (she was a patient here), and the patient maternal grandmother was also diagnosed with breast cancer and died at age 51.  The patient has 1 brother, no sisters. She also had one maternal aunt with lung cancer and a history of smoking, her father with prostate cancer at age 21, and a possible GYN cancer in a paternal aunt.   GYNECOLOGIC HISTORY:  Last menstrual period February 2020  menarche: 53 years old GX P 0, she has had fertility treatments LMP 05/2018, lasted 3 days, irregular (s/p ablation) Contraceptive: used for apprx. 6 months HRT never used  Hysterectomy? no BSO? 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 She is married. Husband Veronica Reilly is a trauma nurse at [employer]. She lives at home with her husband and some hermit crabs and salt and fresh water fish. 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    ADVANCED DIRECTIVES: Husband Veronica Reilly is her HCPOA.   HEALTH MAINTENANCE: Social History   Tobacco Use  . Smoking status: Never Smoker  . Smokeless tobacco: Never Used  Substance Use Topics  . Alcohol use: No  . Drug use: No     Colonoscopy: Pending  PAP: Up-to-date  Bone density: never done   Allergies  Allergen Reactions  . Apple Anaphylaxis  . Iohexol     Contrast dye  . Other Shortness Of Breath    CHERRIES, TREES, MOLD,DUST, COCKROACHES  . Povidone-Iodine Anaphylaxis  . Shellfish Allergy Anaphylaxis  . Peach [Prunus Persica] Itching and Hives    mouth  . Zocor [Simvastatin] Other (See Comments)    Alopecia  . Betadine [Povidone Iodine]     Due to shellfish allergy  . Latex Rash    Current Outpatient Medications  Medication Sig Dispense Refill  . albuterol (VENTOLIN HFA) 108 (90 Base) MCG/ACT inhaler Use 2 puffs every  four hours as needed for cough or wheeze.  May use 2 puffs 10-20 minutes prior to exercise. 1 Inhaler 1  . budesonide-formoterol (SYMBICORT) 160-4.5 MCG/ACT inhaler INHALE 2 PUFFS INTO THE LUNGS 2 (TWO) TIMES DAILY. (Patient taking differently: Inhale 2 puffs into the lungs daily. ) 10.2 g 5  . Cholecalciferol (VITAMIN D3) 50 MCG (2000 UT) TABS Take 2,000 Units by mouth every evening.    . clotrimazole-betamethasone (LOTRISONE) cream Apply 1 application topically 2 (two) times daily. 60 g 0  . dexamethasone (DECADRON) 4 MG tablet Take 2 tablets (8 mg total) by mouth 2 (two) times daily. Start the day before Taxotere. Then again the day after chemo for 3 days. 30 tablet 1  . EPINEPHrine (EPIPEN 2-PAK) 0.3  mg/0.3 mL IJ SOAJ injection Use as directed for a threatening allergic reaction (Patient taking differently: Inject 0.3 mg into the muscle as needed for anaphylaxis. Use as directed for a threatening allergic reaction) 4 Device 1  . fluconazole (DIFLUCAN) 200 MG tablet Take 1 tablet (200 mg total) by mouth daily. 60 tablet 1  . fluticasone (FLONASE) 50 MCG/ACT nasal spray USE 2 SPRAYS IN EACH NOSTRIL ONCE DAILY FOR STUFFY NOSE OR DRAINAGE (Patient taking differently: Place 2 sprays into both nostrils daily. USE 2 SPRAYS IN EACH NOSTRIL ONCE DAILY FOR STUFFY NOSE OR DRAINAG) 16 g 5  . folic acid (FOLVITE) 1 MG tablet Take 1 mg by mouth every evening.     . hydrochlorothiazide (MICROZIDE) 12.5 MG capsule Take 12.5 mg by mouth daily.   1  . levocetirizine (XYZAL) 5 MG tablet Take 5 mg by mouth daily.    Marland Kitchen levothyroxine (SYNTHROID, LEVOTHROID) 112 MCG tablet Take 112 mcg by mouth daily before breakfast.     . lidocaine-prilocaine (EMLA) cream Apply to affected area once 30 g 3  . LORazepam (ATIVAN) 0.5 MG tablet Take 1 tablet (0.5 mg total) by mouth at bedtime as needed (Nausea or vomiting). 30 tablet 0  . montelukast (SINGULAIR) 10 MG tablet Take 1 tablet (10 mg total) by mouth at bedtime. 30 tablet 0  . NIFEdipine (PROCARDIA XL/ADALAT-CC) 60 MG 24 hr tablet Take 60 mg by mouth daily.  1  . Olopatadine HCl (PAZEO) 0.7 % SOLN Apply 1 drop to eye daily. (Patient taking differently: Apply 1 drop to eye daily as needed (allergies.). ) 2.5 mL 5  . prochlorperazine (COMPAZINE) 10 MG tablet Take 1 tablet (10 mg total) by mouth every 6 (six) hours as needed (Nausea or vomiting). 30 tablet 1  . triamcinolone (KENALOG) 0.025 % ointment Apply 1 application topically 2 (two) times daily. 30 g 0   No current facility-administered medications for this visit.     OBJECTIVE:   Vitals:   11/09/18 0859  BP: 139/65  Pulse: (!) 120  Resp: 18  Temp: 98.7 F (37.1 C)  SpO2: 99%     Body mass index is 36.32 kg/m.    Wt Readings from Last 3 Encounters:  11/09/18 225 lb (102.1 kg)  10/27/18 221 lb 12.8 oz (100.6 kg)  10/05/18 217 lb 3.2 oz (98.5 kg)  ECOG FS:1 - Symptomatic but completely ambulatory GENERAL: Patient is a well appearing female in no acute distress HEENT:  Sclerae anicteric.  Mask in place. Neck is supple.  NODES:  No cervical, supraclavicular, or axillary lymphadenopathy palpated.  BREAST EXAM:  Deferred. LUNGS:  Clear to auscultation bilaterally.  No wheezes or rhonchi. HEART:  Regular rate and rhythm. No murmur appreciated. ABDOMEN:  Soft, nontender.  Positive, normoactive bowel sounds. No organomegaly palpated. MSK:  No focal spinal tenderness to palpation. Full range of motion bilaterally in the upper extremities. EXTREMITIES:  No peripheral edema.   SKIN:  Rash on chest wall is now hyperpigmented and flat, no excoriation or erythema noted NEURO:  Nonfocal. Well oriented.  Appropriate affect.     LAB RESULTS:  CMP     Component Value Date/Time   NA 141 11/09/2018 0832   K 4.0 11/09/2018 0832   CL 105 11/09/2018 0832   CO2 23 11/09/2018 0832   GLUCOSE 207 (H) 11/09/2018 0832   BUN 22 (H) 11/09/2018 0832   CREATININE 0.95 11/09/2018 0832   CALCIUM 9.3 11/09/2018 0832   PROT 7.2 11/09/2018 0832   ALBUMIN 3.7 11/09/2018 0832   AST 8 (L) 11/09/2018 0832   ALT 17 11/09/2018 0832   ALKPHOS 86 11/09/2018 0832   BILITOT 0.4 11/09/2018 0832   GFRNONAA >60 11/09/2018 0832   GFRAA >60 11/09/2018 0832    No results found for: TOTALPROTELP, ALBUMINELP, A1GS, A2GS, BETS, BETA2SER, GAMS, MSPIKE, SPEI  No results found for: KPAFRELGTCHN, LAMBDASER, KAPLAMBRATIO  Lab Results  Component Value Date   WBC 17.0 (H) 11/09/2018   NEUTROABS 15.1 (H) 11/09/2018   HGB 10.9 (L) 11/09/2018   HCT 32.9 (L) 11/09/2018   MCV 80.6 11/09/2018   PLT 247 11/09/2018    '@LASTCHEMISTRY'$ @  No results found for: LABCA2  No components found for: FSFSEL953  No results for input(s): INR in  the last 168 hours.  No results found for: LABCA2  No results found for: UYE334  No results found for: DHW861  No results found for: UOH729  No results found for: CA2729  No components found for: HGQUANT  No results found for: CEA1 / No results found for: CEA1   No results found for: AFPTUMOR  No results found for: CHROMOGRNA  No results found for: PSA1  Appointment on 11/09/2018  Component Date Value Ref Range Status  . Sodium 11/09/2018 141  135 - 145 mmol/L Final  . Potassium 11/09/2018 4.0  3.5 - 5.1 mmol/L Final  . Chloride 11/09/2018 105  98 - 111 mmol/L Final  . CO2 11/09/2018 23  22 - 32 mmol/L Final  . Glucose, Bld 11/09/2018 207* 70 - 99 mg/dL Final  . BUN 11/09/2018 22* 6 - 20 mg/dL Final  . Creatinine 11/09/2018 0.95  0.44 - 1.00 mg/dL Final  . Calcium 11/09/2018 9.3  8.9 - 10.3 mg/dL Final  . Total Protein 11/09/2018 7.2  6.5 - 8.1 g/dL Final  . Albumin 11/09/2018 3.7  3.5 - 5.0 g/dL Final  . AST 11/09/2018 8* 15 - 41 U/L Final  . ALT 11/09/2018 17  0 - 44 U/L Final  . Alkaline Phosphatase 11/09/2018 86  38 - 126 U/L Final  . Total Bilirubin 11/09/2018 0.4  0.3 - 1.2 mg/dL Final  . GFR, Est Non Af Am 11/09/2018 >60  >60 mL/min Final  . GFR, Est AFR Am 11/09/2018 >60  >60 mL/min Final  . Anion gap 11/09/2018 13  5 - 15 Final   Performed at [facility], [place] [address] [place], [place] [address]  . WBC Count 11/09/2018 17.0* 4.0 - 10.5 K/uL Final  . RBC 11/09/2018 4.08  3.87 - 5.11 MIL/uL Final  . Hemoglobin 11/09/2018 10.9* 12.0 - 15.0 g/dL Final  . HCT 11/09/2018 32.9* 36.0 - 46.0 % Final  . MCV 11/09/2018 80.6  80.0 -  100.0 fL Final  . MCH 11/09/2018 26.7  26.0 - 34.0 pg Final  . MCHC 11/09/2018 33.1  30.0 - 36.0 g/dL Final  . RDW 11/09/2018 15.4  11.5 - 15.5 % Final  . Platelet Count 11/09/2018 247  150 - 400 K/uL Final  . nRBC 11/09/2018 0.0  0.0 - 0.2 % Final  . Neutrophils Relative % 11/09/2018 89  % Final  . Neutro Abs  11/09/2018 15.1* 1.7 - 7.7 K/uL Final  . Lymphocytes Relative 11/09/2018 7  % Final  . Lymphs Abs 11/09/2018 1.2  0.7 - 4.0 K/uL Final  . Monocytes Relative 11/09/2018 3  % Final  . Monocytes Absolute 11/09/2018 0.5  0.1 - 1.0 K/uL Final  . Eosinophils Relative 11/09/2018 0  % Final  . Eosinophils Absolute 11/09/2018 0.0  0.0 - 0.5 K/uL Final  . Basophils Relative 11/09/2018 0  % Final  . Basophils Absolute 11/09/2018 0.0  0.0 - 0.1 K/uL Final  . Immature Granulocytes 11/09/2018 1  % Final  . Abs Immature Granulocytes 11/09/2018 0.14* 0.00 - 0.07 K/uL Final   Performed at [facility], [address] [place], [place] [address]    (this displays the last labs from the last 3 days)  No results found for: TOTALPROTELP, ALBUMINELP, A1GS, A2GS, BETS, BETA2SER, GAMS, MSPIKE, SPEI (this displays SPEP labs)  No results found for: KPAFRELGTCHN, LAMBDASER, KAPLAMBRATIO (kappa/lambda light chains)  No results found for: HGBA, HGBA2QUANT, HGBFQUANT, HGBSQUAN (Hemoglobinopathy evaluation)   No results found for: LDH  No results found for: IRON, TIBC, IRONPCTSAT (Iron and TIBC)  No results found for: FERRITIN  Urinalysis    Component Value Date/Time   COLORURINE YELLOW 05/15/2007 0957   APPEARANCEUR CLEAR 05/15/2007 0957   LABSPEC 1.010 05/15/2007 0957   PHURINE 8.0 05/15/2007 0957   GLUCOSEU NEGATIVE 05/15/2007 0957   HGBUR NEGATIVE 05/15/2007 0957   BILIRUBINUR NEGATIVE 05/15/2007 0957   KETONESUR NEGATIVE 05/15/2007 0957   PROTEINUR NEGATIVE 05/15/2007 0957   UROBILINOGEN 0.2 05/15/2007 0957   NITRITE NEGATIVE 05/15/2007 0957   LEUKOCYTESUR  05/15/2007 0957    NEGATIVE MICROSCOPIC NOT DONE ON URINES WITH NEGATIVE PROTEIN, BLOOD, LEUKOCYTES, NITRITE, OR GLUCOSE <1000 mg/dL.     STUDIES: No results found.  ELIGIBLE FOR AVAILABLE RESEARCH PROTOCOL: no  ASSESSMENT: 53 y.o.  [place] status post left breast upper outer quadrant biopsy 06/29/2018  for a clinical T2N0 invasive ductal carcinoma, grade 2, estrogen receptor positive, progesterone receptor negative, with an MIB-1-1 of 10%, and no HER-2 amplification.  (1) genetics 07/18/2018 through the [facility] Common Hereditary Cancers Panel found no deleterious mutations in APC, ATM, AXIN2, BARD1, BMPR1A, BRCA1, BRCA2, BRIP1, CDH1, CDKN2A (p14ARF), CDKN2A (p16INK4a), CKD4, CHEK2, CTNNA1, DICER1, EPCAM (Deletion/duplication testing only), GREM1 (promoter region deletion/duplication testing only), KIT, MEN1, MLH1, MSH2, MSH3, MSH6, MUTYH, NBN, NF1, NHTL1, PALB2, PDGFRA, PMS2, POLD1, POLE, PTEN, RAD50, RAD51C, RAD51D, SDHB, SDHC, SDHD, SMAD4, SMARCA4. STK11, TP53, TSC1, TSC2, and VHL.  The following genes were evaluated for sequence changes only: SDHA and HOXB13 c.251G>A variant only.   (2) status post left lumpectomy and sentinel lymph node sampling 08/22/2018 for a pT2 pN0, stage IB invasive ductal carcinoma, grade 2, with negative margins  (a) a total of 10 axillary lymph nodes were removed  (3) Oncotype score of 34 predicts a risk of recurrence outside the breast in the next 9 years of 22% if the patient's only systemic therapy is antiestrogens for 5 years.  It also predicts significant benefit from  chemotherapy.  (4) adjuvant chemotherapy consisting of cyclophosphamide and docetaxel given every 21 days x 4 beginning on 09/29/2018  (5) adjuvant radiation to follow  (6) antiestrogens to start at the completion of local treatment.  PLAN: Tesia Lybrand is doing well today.  She continues on adjuvant chemotherapy with cyclophosphamide and docetaxel.  She will proceed with cycle 3 of treatment today.  She is tolerating this moderately well.  I reviewed her CBC with her today which remains stable.  Lamira Borin has some palpitations and a jittery feeling that starts the day prior to treatment while taking the dexamethasone.  I recommended she decrease further doses of dexamethasone to '4mg'$  bid instead of '8mg'$   bid.    daira hine will continue the fluconazole for her previous fungal rash.  It is improving.  anneli bing has no peripheral neuropathy.  She had some dehydration following cycle 2, she will receive IV fluids on Saturday when she has her injection.  Hopefully this will help.  We will see her back in 3 weeks for labs, f/u, and her fourth and final cycle of treatment.  I put in referral to radiation oncology to get her scheduled after that.  She was recommended to continue with the appropriate pandemic precautions. She knows to call for any questions that may arise between now and her next appointment.  We are happy to see her sooner if needed.  A total of (30) minutes of face-to-face time was spent with this patient with greater than 50% of that time in counseling and care-coordination.   Wilber Bihari, NP Medical Oncology and Hematology [facility] [address] [place], [place] [address] Tel. [phone]    Fax. [phone]

## 2018-11-10 ENCOUNTER — Telehealth: Payer: Self-pay | Admitting: Radiation Oncology

## 2018-11-10 ENCOUNTER — Other Ambulatory Visit: Payer: Self-pay | Admitting: Allergy

## 2018-11-10 DIAGNOSIS — J4540 Moderate persistent asthma, uncomplicated: Secondary | ICD-10-CM

## 2018-11-10 NOTE — Telephone Encounter (Signed)
New Message: ° ° °LVM for patient to return call to schedule appt from referral received. °

## 2018-11-11 ENCOUNTER — Inpatient Hospital Stay: Payer: BC Managed Care – PPO

## 2018-11-11 ENCOUNTER — Other Ambulatory Visit: Payer: Self-pay

## 2018-11-11 VITALS — BP 121/73 | HR 86 | Temp 98.3°F | Resp 16

## 2018-11-11 DIAGNOSIS — Z95828 Presence of other vascular implants and grafts: Secondary | ICD-10-CM

## 2018-11-11 DIAGNOSIS — C50412 Malignant neoplasm of upper-outer quadrant of left female breast: Secondary | ICD-10-CM | POA: Diagnosis not present

## 2018-11-11 DIAGNOSIS — Z170 Estrogen receptor positive status [ER+]: Secondary | ICD-10-CM

## 2018-11-11 MED ORDER — HEPARIN SOD (PORK) LOCK FLUSH 100 UNIT/ML IV SOLN
500.0000 [IU] | Freq: Once | INTRAVENOUS | Status: AC | PRN
  Administered 2018-11-11: 500 [IU]
  Filled 2018-11-11: qty 5

## 2018-11-11 MED ORDER — PEGFILGRASTIM-JMDB 6 MG/0.6ML ~~LOC~~ SOSY
6.0000 mg | PREFILLED_SYRINGE | Freq: Once | SUBCUTANEOUS | Status: AC
  Administered 2018-11-11: 6 mg via SUBCUTANEOUS

## 2018-11-11 MED ORDER — SODIUM CHLORIDE 0.9% FLUSH
10.0000 mL | INTRAVENOUS | Status: DC | PRN
  Administered 2018-11-11: 10 mL
  Filled 2018-11-11: qty 10

## 2018-11-11 MED ORDER — SODIUM CHLORIDE 0.9 % IV SOLN
INTRAVENOUS | Status: AC
  Administered 2018-11-11: 11:00:00 via INTRAVENOUS
  Filled 2018-11-11 (×2): qty 250

## 2018-11-11 NOTE — Patient Instructions (Signed)

## 2018-11-13 ENCOUNTER — Other Ambulatory Visit: Payer: Self-pay | Admitting: *Deleted

## 2018-11-13 DIAGNOSIS — J4540 Moderate persistent asthma, uncomplicated: Secondary | ICD-10-CM

## 2018-11-13 MED ORDER — MONTELUKAST SODIUM 10 MG PO TABS: 10.0000 mg | ORAL_TABLET | Freq: Every day | ORAL | 0 refills | Status: DC

## 2018-11-15 ENCOUNTER — Encounter: Payer: Self-pay | Admitting: Adult Health

## 2018-11-20 ENCOUNTER — Encounter: Payer: Self-pay | Admitting: Adult Health

## 2018-11-21 ENCOUNTER — Other Ambulatory Visit: Payer: Self-pay | Admitting: *Deleted

## 2018-11-21 ENCOUNTER — Other Ambulatory Visit: Payer: Self-pay | Admitting: Adult Health

## 2018-11-21 NOTE — Telephone Encounter (Signed)
Per Wilber Bihari, NP, called in refill for Lotrisone cream. Pt made aware of refill and verbalized understanding.

## 2018-11-28 ENCOUNTER — Encounter: Payer: Self-pay | Admitting: Adult Health

## 2018-11-30 ENCOUNTER — Other Ambulatory Visit: Payer: Self-pay

## 2018-11-30 ENCOUNTER — Inpatient Hospital Stay: Payer: BC Managed Care – PPO

## 2018-11-30 ENCOUNTER — Telehealth: Payer: Self-pay | Admitting: Adult Health

## 2018-11-30 ENCOUNTER — Inpatient Hospital Stay: Payer: BC Managed Care – PPO | Attending: Oncology

## 2018-11-30 ENCOUNTER — Inpatient Hospital Stay: Payer: BC Managed Care – PPO | Admitting: Adult Health

## 2018-11-30 ENCOUNTER — Telehealth: Payer: Self-pay | Admitting: *Deleted

## 2018-11-30 ENCOUNTER — Encounter: Payer: Self-pay | Admitting: Adult Health

## 2018-11-30 VITALS — BP 128/67 | HR 114 | Temp 97.9°F | Resp 18 | Ht 66.0 in | Wt 227.4 lb

## 2018-11-30 VITALS — HR 106

## 2018-11-30 DIAGNOSIS — Z79899 Other long term (current) drug therapy: Secondary | ICD-10-CM | POA: Insufficient documentation

## 2018-11-30 DIAGNOSIS — Z170 Estrogen receptor positive status [ER+]: Secondary | ICD-10-CM

## 2018-11-30 DIAGNOSIS — Z5111 Encounter for antineoplastic chemotherapy: Secondary | ICD-10-CM | POA: Diagnosis present

## 2018-11-30 DIAGNOSIS — J45909 Unspecified asthma, uncomplicated: Secondary | ICD-10-CM | POA: Insufficient documentation

## 2018-11-30 DIAGNOSIS — E039 Hypothyroidism, unspecified: Secondary | ICD-10-CM | POA: Diagnosis not present

## 2018-11-30 DIAGNOSIS — C50412 Malignant neoplasm of upper-outer quadrant of left female breast: Secondary | ICD-10-CM

## 2018-11-30 DIAGNOSIS — Z833 Family history of diabetes mellitus: Secondary | ICD-10-CM | POA: Insufficient documentation

## 2018-11-30 DIAGNOSIS — Z803 Family history of malignant neoplasm of breast: Secondary | ICD-10-CM | POA: Diagnosis not present

## 2018-11-30 DIAGNOSIS — I10 Essential (primary) hypertension: Secondary | ICD-10-CM | POA: Insufficient documentation

## 2018-11-30 DIAGNOSIS — Z801 Family history of malignant neoplasm of trachea, bronchus and lung: Secondary | ICD-10-CM | POA: Diagnosis not present

## 2018-11-30 DIAGNOSIS — Z8042 Family history of malignant neoplasm of prostate: Secondary | ICD-10-CM | POA: Insufficient documentation

## 2018-11-30 DIAGNOSIS — Z7951 Long term (current) use of inhaled steroids: Secondary | ICD-10-CM | POA: Insufficient documentation

## 2018-11-30 DIAGNOSIS — Z8249 Family history of ischemic heart disease and other diseases of the circulatory system: Secondary | ICD-10-CM | POA: Diagnosis not present

## 2018-11-30 DIAGNOSIS — Z95828 Presence of other vascular implants and grafts: Secondary | ICD-10-CM

## 2018-11-30 LAB — CMP (CANCER CENTER ONLY)
ALT: 17 U/L (ref 0–44)
AST: 9 U/L — ABNORMAL LOW (ref 15–41)
Albumin: 4.3 g/dL (ref 3.5–5.0)
Alkaline Phosphatase: 94 U/L (ref 38–126)
Anion gap: 12 (ref 5–15)
BUN: 25 mg/dL — ABNORMAL HIGH (ref 6–20)
CO2: 23 mmol/L (ref 22–32)
Calcium: 10 mg/dL (ref 8.9–10.3)
Chloride: 106 mmol/L (ref 98–111)
Creatinine: 1.06 mg/dL — ABNORMAL HIGH (ref 0.44–1.00)
GFR, Est AFR Am: >60 mL/min (ref >60)
GFR, Estimated: >60 mL/min (ref >60)
Glucose, Bld: 163 mg/dL — ABNORMAL HIGH (ref 70–99)
Potassium: 3.9 mmol/L (ref 3.5–5.1)
Sodium: 141 mmol/L (ref 135–145)
Total Bilirubin: 0.4 mg/dL (ref 0.3–1.2)
Total Protein: 7.9 g/dL (ref 6.5–8.1)

## 2018-11-30 LAB — CBC WITH DIFFERENTIAL (CANCER CENTER ONLY)
Abs Immature Granulocytes: 0.51 10*3/uL — ABNORMAL HIGH (ref 0.00–0.07)
Basophils Absolute: 0 10*3/uL (ref 0.0–0.1)
Basophils Relative: 0 %
Eosinophils Absolute: 0 10*3/uL (ref 0.0–0.5)
Eosinophils Relative: 0 %
HCT: 35.4 % — ABNORMAL LOW (ref 36.0–46.0)
Hemoglobin: 11.8 g/dL — ABNORMAL LOW (ref 12.0–15.0)
Immature Granulocytes: 2 %
Lymphocytes Relative: 6 %
Lymphs Abs: 1.4 10*3/uL (ref 0.7–4.0)
MCH: 27.6 pg (ref 26.0–34.0)
MCHC: 33.3 g/dL (ref 30.0–36.0)
MCV: 82.7 fL (ref 80.0–100.0)
Monocytes Absolute: 0.7 10*3/uL (ref 0.1–1.0)
Monocytes Relative: 3 %
Neutro Abs: 19.6 10*3/uL — ABNORMAL HIGH (ref 1.7–7.7)
Neutrophils Relative %: 89 %
Platelet Count: 266 10*3/uL (ref 150–400)
RBC: 4.28 MIL/uL (ref 3.87–5.11)
RDW: 16.3 % — ABNORMAL HIGH (ref 11.5–15.5)
WBC Count: 22.2 10*3/uL — ABNORMAL HIGH (ref 4.0–10.5)
nRBC: 0 % (ref 0.0–0.2)

## 2018-11-30 MED ORDER — DEXAMETHASONE SODIUM PHOSPHATE 10 MG/ML IJ SOLN
10.0000 mg | Freq: Once | INTRAMUSCULAR | Status: AC
  Administered 2018-11-30: 10 mg via INTRAVENOUS

## 2018-11-30 MED ORDER — SODIUM CHLORIDE 0.9% FLUSH
10.0000 mL | INTRAVENOUS | Status: DC | PRN
  Administered 2018-11-30: 10 mL
  Filled 2018-11-30: qty 10

## 2018-11-30 MED ORDER — SODIUM CHLORIDE 0.9 % IV SOLN
Freq: Once | INTRAVENOUS | Status: AC
  Administered 2018-11-30: 10:00:00 via INTRAVENOUS
  Filled 2018-11-30: qty 250

## 2018-11-30 MED ORDER — SODIUM CHLORIDE 0.9 % IV SOLN
75.0000 mg/m2 | Freq: Once | INTRAVENOUS | Status: AC
  Administered 2018-11-30: 160 mg via INTRAVENOUS
  Filled 2018-11-30: qty 16

## 2018-11-30 MED ORDER — DEXAMETHASONE SODIUM PHOSPHATE 10 MG/ML IJ SOLN
INTRAMUSCULAR | Status: AC
  Filled 2018-11-30: qty 1

## 2018-11-30 MED ORDER — PALONOSETRON HCL INJECTION 0.25 MG/5ML
0.2500 mg | Freq: Once | INTRAVENOUS | Status: AC
  Administered 2018-11-30: 0.25 mg via INTRAVENOUS

## 2018-11-30 MED ORDER — SODIUM CHLORIDE 0.9 % IV SOLN
600.0000 mg/m2 | Freq: Once | INTRAVENOUS | Status: AC
  Administered 2018-11-30: 1260 mg via INTRAVENOUS
  Filled 2018-11-30: qty 63

## 2018-11-30 MED ORDER — HEPARIN SOD (PORK) LOCK FLUSH 100 UNIT/ML IV SOLN
500.0000 [IU] | Freq: Once | INTRAVENOUS | Status: AC | PRN
  Administered 2018-11-30: 500 [IU]
  Filled 2018-11-30: qty 5

## 2018-11-30 MED ORDER — PALONOSETRON HCL INJECTION 0.25 MG/5ML
INTRAVENOUS | Status: AC
  Filled 2018-11-30: qty 5

## 2018-11-30 NOTE — Progress Notes (Signed)
Pt did not want to stay accessed for treatment on 12/02/18

## 2018-11-30 NOTE — Progress Notes (Signed)
[name]  [phone] [phone]     ID: Veronica Reilly DOB: 10/30/1965  MR#: [id number]  AJG#:[id number]  Patient Care Team: Leeroy Cha, MD as PCP - General (Internal Medicine) Magrinat, Virgie Dad, MD as Consulting Physician (Oncology) Kyung Rudd, MD as Consulting Physician (Radiation Oncology) Erroll Luna, MD as Consulting Physician (General Surgery) Delsa Bern, MD as Consulting Physician (Obstetrics and Gynecology) Kennith Gain, MD as Consulting Physician (Allergy) Delrae Rend, MD as Consulting Physician (Endocrinology) Rockwell Germany, RN as Oncology Nurse Navigator Mauro Kaufmann, RN as Oncology Nurse Navigator Scot Dock, NP OTHER MD:  CHIEF COMPLAINT: estrogen receptor positive breast cancer  CURRENT TREATMENT: Adjuvant chemotherapy  INTERVAL HISTORY: Veronica Reilly returns today for continuing adjuvant chemotherapy for her estrogen receptor positive breast cancer.  Today is day 1 cycle 4 of cyclophosphamide/docetaxel which she receives every 21 days, with a total of 4 cycles planned.  She receives IV fluids on day 3 after treatment.  She says this is working well for her.    [place] has discussion with Dr. Lisbeth Renshaw and Bryson Ha to review adjuvant radiation on 12/06/2018.  REVIEW OF SYSTEMS: Veronica Reilly is doing well today.  She does continue to note her fungal rash, but it is being treated with the Fluconazole and Lotrisone cream.  She is tolerating these well.  She notes that she is more fatigued than prior, and she has only been able to walk once this past 3 weeks.  She is also working as a principal.  She notes she works remotely 1.5 weeks after her treatment, and then will go in 2-3 half days per week after that.    She has taken advil alternating with tylenol for bone pain from the Neulasta.  She had one episode of vomiting, however that resolved.    [name] denies any fever, chills, chest pain, palpitations, cough,  shortness of breath, bowel/bladder changes, nausea, peripheral neuropathy, or any other concerns.  A detailed ROS Was otherwise non contributory.  HISTORY OF CURRENT ILLNESS: From the original intake note:  Veronica Reilly had some calcifications in the left breast noted March 2019 on screening mammography.. She underwent biopsy of the left retroareolar area of concern on 03/18/2017 with pathology ([id number]) showing fibrocystic changes with no malignancy. Return in 6 months for left diagnostic mammography and ultrasonography was recommended, and she reports this took place in November 2019 at her gynecologist's office and the changes previously noted were stable.  She underwent bilateral diagnostic mammography with tomography and left breast ultrasonography at [facility] on 06/27/2018 showing: breast density category C; left breast upper-outer quadrant 3.1 cm group of indeterminate calcifications; persistent benign-appearing cyst in the subareolar left breast, post core needle biopsy changes.  On physical exam there were no palpable masses.  Targeted ultrasound revealed no suspicious masses.  There was a left subareolar breast cyst measuring 1.0 cm.  Left axilla was unremarkable.  Accordingly on 06/29/2018 53 proceeded to biopsy of the left breast calcifications area in question. The pathology from this procedure ([id number]) showed: invasive ductal carcinoma, grade 2, with ductal carcinoma in situ. Prognostic indicators significant for: estrogen receptor, 100% positive with strong staining intensity and progesterone receptor, 0% negative. Proliferation marker Ki67 at 10%. HER2 negtive by immunohistochemistry, (0).  The patient's subsequent history is as detailed below.   PAST MEDICAL HISTORY: Past Medical History:  Diagnosis Date  . ASCUS (atypical squamous cells of undetermined significance) on Pap smear   . Asthma  controlled with daily inhaler  . Breast cancer ([place]) 06/29/2018    Left IDC  . Dysplasia of cervix, low grade (CIN 1)   . Eczema   . Family history of breast cancer   . Family history of lung cancer   . Family history of prostate cancer   . GERD (gastroesophageal reflux disease)   . Heart murmur   . History of chicken pox   . Hypertension   . Hypothyroid   . Uterine fibroid   Heart murmur, evaluated by cardiologist   PAST SURGICAL HISTORY: Past Surgical History:  Procedure Laterality Date  . ADENOIDECTOMY    . BREAST LUMPECTOMY WITH RADIOACTIVE SEED AND SENTINEL LYMPH NODE BIOPSY Left 08/22/2018   Procedure: LEFT BREAST LUMPECTOMY WITH RADIOACTIVE SEED AND SENTINEL LYMPH NODE MAPPING;  Surgeon: Erroll Luna, MD;  Location: [facility];  Service: General;  Laterality: Left;  . LEEP    . MYOMECTOMY    . PORTACATH PLACEMENT Right 09/27/2018   Procedure: INSERTION PORT-A-CATH WITH ULTRASOUND;  Surgeon: Erroll Luna, MD;  Location: [facility];  Service: General;  Laterality: Right;  . TONSILECTOMY, ADENOIDECTOMY, BILATERAL MYRINGOTOMY AND TUBES    . TONSILLECTOMY      FAMILY HISTORY: Family History  Problem Relation Age of Onset  . Breast cancer Maternal Grandmother   . Heart disease Father   . Diabetes Father   . Prostate cancer Father 5  . Breast cancer Mother 15  . Hypertension Other   . Lung cancer Maternal Aunt   . Allergic rhinitis Neg Hx   . Angioedema Neg Hx   . Asthma Neg Hx   . Eczema Neg Hx   . Immunodeficiency Neg Hx   . Urticaria Neg Hx    Patient's father was 35 years old when he died from diabetes and renal failure. Patient's mother died from breast cancer at age 109. The patient notes a family hx of breast and possibly ovarian cancer. Her mother was diagnosed with inflammatory breast cancer at around age 66 (she was a patient here), and the patient maternal grandmother was also diagnosed with breast cancer and died at age 43.  The patient has 1 brother, no sisters. She also had one maternal aunt with lung cancer  and a history of smoking, her father with prostate cancer at age 25, and a possible GYN cancer in a paternal aunt.   GYNECOLOGIC HISTORY:  Last menstrual period February 2020  menarche: 53 years old GX P 0, she has had fertility treatments LMP 05/2018, lasted 3 days, irregular (s/p ablation) Contraceptive: used for apprx. 6 months HRT never used  Hysterectomy? no BSO? 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 She is married. Husband Celesta Gentile is a trauma nurse at [employer]. She lives at home with her husband and some hermit crabs and salt and fresh water fish. 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    ADVANCED DIRECTIVES: Husband Celesta Gentile is her HCPOA.   HEALTH MAINTENANCE: Social History   Tobacco Use  . Smoking status: Never Smoker  . Smokeless tobacco: Never Used  Substance Use Topics  . Alcohol use: No  . Drug use: No     Colonoscopy: Pending  PAP: Up-to-date  Bone density: never done   Allergies  Allergen Reactions  . Apple Anaphylaxis  . Iohexol     Contrast dye  .  Other Shortness Of Breath    CHERRIES, TREES, MOLD,DUST, COCKROACHES  . Povidone-Iodine Anaphylaxis  . Shellfish Allergy Anaphylaxis  . Peach [Prunus Persica] Itching and Hives    mouth  . Zocor [Simvastatin] Other (See Comments)    Alopecia  . Betadine [Povidone Iodine]     Due to shellfish allergy  . Latex Rash    Current Outpatient Medications  Medication Sig Dispense Refill  . albuterol (VENTOLIN HFA) 108 (90 Base) MCG/ACT inhaler Use 2 puffs every four hours as needed for cough or wheeze.  May use 2 puffs 10-20 minutes prior to exercise. 1 Inhaler 1  . budesonide-formoterol (SYMBICORT) 160-4.5 MCG/ACT inhaler INHALE 2 PUFFS INTO THE LUNGS 2 (TWO) TIMES DAILY. (Patient taking differently: Inhale 2 puffs into the lungs daily. ) 10.2 g 5  . Cholecalciferol  (VITAMIN D3) 50 MCG (2000 UT) TABS Take 2,000 Units by mouth every evening.    . clotrimazole-betamethasone (LOTRISONE) cream APPLY EXTERNALLY TO THE AFFECTED AREA TWICE DAILY 60 g 0  . dexamethasone (DECADRON) 4 MG tablet Take 2 tablets (8 mg total) by mouth 2 (two) times daily. Start the day before Taxotere. Then again the day after chemo for 3 days. 30 tablet 1  . EPINEPHrine (EPIPEN 2-PAK) 0.3 mg/0.3 mL IJ SOAJ injection Use as directed for a threatening allergic reaction (Patient taking differently: Inject 0.3 mg into the muscle as needed for anaphylaxis. Use as directed for a threatening allergic reaction) 4 Device 1  . fluconazole (DIFLUCAN) 200 MG tablet Take 1 tablet (200 mg total) by mouth daily. 60 tablet 1  . fluticasone (FLONASE) 50 MCG/ACT nasal spray USE 2 SPRAYS IN EACH NOSTRIL ONCE DAILY FOR STUFFY NOSE OR DRAINAGE (Patient taking differently: Place 2 sprays into both nostrils daily. USE 2 SPRAYS IN EACH NOSTRIL ONCE DAILY FOR STUFFY NOSE OR DRAINAG) 16 g 5  . folic acid (FOLVITE) 1 MG tablet Take 1 mg by mouth every evening.     . hydrochlorothiazide (MICROZIDE) 12.5 MG capsule Take 12.5 mg by mouth daily.   1  . levocetirizine (XYZAL) 5 MG tablet Take 5 mg by mouth daily.    Marland Kitchen levothyroxine (SYNTHROID, LEVOTHROID) 112 MCG tablet Take 112 mcg by mouth daily before breakfast.     . lidocaine-prilocaine (EMLA) cream Apply to affected area once 30 g 3  . LORazepam (ATIVAN) 0.5 MG tablet Take 1 tablet (0.5 mg total) by mouth at bedtime as needed (Nausea or vomiting). 30 tablet 0  . montelukast (SINGULAIR) 10 MG tablet Take 1 tablet (10 mg total) by mouth at bedtime. 30 tablet 0  . NIFEdipine (PROCARDIA XL/ADALAT-CC) 60 MG 24 hr tablet Take 60 mg by mouth daily.  1  . Olopatadine HCl (PAZEO) 0.7 % SOLN Apply 1 drop to eye daily. (Patient taking differently: Apply 1 drop to eye daily as needed (allergies.). ) 2.5 mL 5  . prochlorperazine (COMPAZINE) 10 MG tablet Take 1 tablet (10 mg total)  by mouth every 6 (six) hours as needed (Nausea or vomiting). 30 tablet 1  . triamcinolone (KENALOG) 0.025 % ointment Apply 1 application topically 2 (two) times daily. 30 g 0   No current facility-administered medications for this visit.    Facility-Administered Medications Ordered in Other Visits  Medication Dose Route Frequency Provider Last Rate Last Dose  . cyclophosphamide (CYTOXAN) 1,260 mg in sodium chloride 0.9 % 250 mL chemo infusion  600 mg/m2 (Treatment Plan Recorded) Intravenous Once Magrinat, Virgie Dad, MD      . DOCEtaxel (  TAXOTERE) 160 mg in sodium chloride 0.9 % 250 mL chemo infusion  75 mg/m2 (Treatment Plan Recorded) Intravenous Once Magrinat, Virgie Dad, MD 266 mL/hr at 11/30/18 1015 160 mg at 11/30/18 1015  . heparin lock flush 100 unit/mL  500 Units Intracatheter Once PRN Magrinat, Virgie Dad, MD      . sodium chloride flush (NS) 0.9 % injection 10 mL  10 mL Intracatheter PRN Magrinat, Virgie Dad, MD        OBJECTIVE:   Vitals:   11/30/18 0841  BP: 128/67  Pulse: (!) 114  Resp: 18  Temp: 97.9 F (36.6 C)  SpO2: 99%     Body mass index is 36.7 kg/m.   Wt Readings from Last 3 Encounters:  11/30/18 227 lb 6.4 oz (103.1 kg)  11/09/18 225 lb (102.1 kg)  10/27/18 221 lb 12.8 oz (100.6 kg)  ECOG FS:1 - Symptomatic but completely ambulatory GENERAL: Patient is a well appearing female in no acute distress HEENT:  Sclerae anicteric.  Mask in place. Neck is supple.  NODES:  No cervical, supraclavicular, or axillary lymphadenopathy palpated.  BREAST EXAM:  Deferred. LUNGS:  Clear to auscultation bilaterally.  No wheezes or rhonchi. HEART:  Regular rate and rhythm. No murmur appreciated. ABDOMEN:  Soft, nontender.  Positive, normoactive bowel sounds. No organomegaly palpated. MSK:  No focal spinal tenderness to palpation. Full range of motion bilaterally in the upper extremities. EXTREMITIES:  No peripheral edema.   SKIN:  Rash on chest wall is now hyperpigmented and flat, no  excoriation or erythema noted NEURO:  Nonfocal. Well oriented.  Appropriate affect.     LAB RESULTS:  CMP     Component Value Date/Time   NA 141 11/30/2018 0820   K 3.9 11/30/2018 0820   CL 106 11/30/2018 0820   CO2 23 11/30/2018 0820   GLUCOSE 163 (H) 11/30/2018 0820   BUN 25 (H) 11/30/2018 0820   CREATININE 1.06 (H) 11/30/2018 0820   CALCIUM 10.0 11/30/2018 0820   PROT 7.9 11/30/2018 0820   ALBUMIN 4.3 11/30/2018 0820   AST 9 (L) 11/30/2018 0820   ALT 17 11/30/2018 0820   ALKPHOS 94 11/30/2018 0820   BILITOT 0.4 11/30/2018 0820   GFRNONAA >60 11/30/2018 0820   GFRAA >60 11/30/2018 0820    No results found for: TOTALPROTELP, ALBUMINELP, A1GS, A2GS, BETS, BETA2SER, GAMS, MSPIKE, SPEI  No results found for: KPAFRELGTCHN, LAMBDASER, KAPLAMBRATIO  Lab Results  Component Value Date   WBC 22.2 (H) 11/30/2018   NEUTROABS 19.6 (H) 11/30/2018   HGB 11.8 (L) 11/30/2018   HCT 35.4 (L) 11/30/2018   MCV 82.7 11/30/2018   PLT 266 11/30/2018    '@LASTCHEMISTRY'$ @  No results found for: LABCA2  No components found for: [id number]  No results for input(s): INR in the last 168 hours.  No results found for: LABCA2  No results found for: ZHY865  No results found for: HQI696  No results found for: EXB284  No results found for: CA2729  No components found for: HGQUANT  No results found for: CEA1 / No results found for: CEA1   No results found for: AFPTUMOR  No results found for: [place]  No results found for: PSA1  Appointment on 11/30/2018  Component Date Value Ref Range Status  . Sodium 11/30/2018 141  135 - 145 mmol/L Final  . Potassium 11/30/2018 3.9  3.5 - 5.1 mmol/L Final  . Chloride 11/30/2018 106  98 - 111 mmol/L Final  . CO2 11/30/2018 23  22 -  32 mmol/L Final  . Glucose, Bld 11/30/2018 163* 70 - 99 mg/dL Final  . BUN 11/30/2018 25* 6 - 20 mg/dL Final  . Creatinine 11/30/2018 1.06* 0.44 - 1.00 mg/dL Final  . Calcium 11/30/2018 10.0  8.9 - 10.3 mg/dL  Final  . Total Protein 11/30/2018 7.9  6.5 - 8.1 g/dL Final  . Albumin 11/30/2018 4.3  3.5 - 5.0 g/dL Final  . AST 11/30/2018 9* 15 - 41 U/L Final  . ALT 11/30/2018 17  0 - 44 U/L Final  . Alkaline Phosphatase 11/30/2018 94  38 - 126 U/L Final  . Total Bilirubin 11/30/2018 0.4  0.3 - 1.2 mg/dL Final  . GFR, Est Non Af Am 11/30/2018 >60  >60 mL/min Final  . GFR, Est AFR Am 11/30/2018 >60  >60 mL/min Final  . Anion gap 11/30/2018 12  5 - 15 Final   Performed at [facility], [place] [address] [place], [place] [address]  . WBC Count 11/30/2018 22.2* 4.0 - 10.5 K/uL Final  . RBC 11/30/2018 4.28  3.87 - 5.11 MIL/uL Final  . Hemoglobin 11/30/2018 11.8* 12.0 - 15.0 g/dL Final  . HCT 11/30/2018 35.4* 36.0 - 46.0 % Final  . MCV 11/30/2018 82.7  80.0 - 100.0 fL Final  . MCH 11/30/2018 27.6  26.0 - 34.0 pg Final  . MCHC 11/30/2018 33.3  30.0 - 36.0 g/dL Final  . RDW 11/30/2018 16.3* 11.5 - 15.5 % Final  . Platelet Count 11/30/2018 266  150 - 400 K/uL Final  . nRBC 11/30/2018 0.0  0.0 - 0.2 % Final  . Neutrophils Relative % 11/30/2018 89  % Final  . Neutro Abs 11/30/2018 19.6* 1.7 - 7.7 K/uL Final  . Lymphocytes Relative 11/30/2018 6  % Final  . Lymphs Abs 11/30/2018 1.4  0.7 - 4.0 K/uL Final  . Monocytes Relative 11/30/2018 3  % Final  . Monocytes Absolute 11/30/2018 0.7  0.1 - 1.0 K/uL Final  . Eosinophils Relative 11/30/2018 0  % Final  . Eosinophils Absolute 11/30/2018 0.0  0.0 - 0.5 K/uL Final  . Basophils Relative 11/30/2018 0  % Final  . Basophils Absolute 11/30/2018 0.0  0.0 - 0.1 K/uL Final  . Immature Granulocytes 11/30/2018 2  % Final  . Abs Immature Granulocytes 11/30/2018 0.51* 0.00 - 0.07 K/uL Final   Performed at [facility], [place] [address] [place], [place] [address]    (this displays the last labs from the last 3 days)  No results found for: TOTALPROTELP, ALBUMINELP, A1GS, A2GS, BETS, BETA2SER, GAMS, MSPIKE, SPEI (this  displays SPEP labs)  No results found for: KPAFRELGTCHN, LAMBDASER, KAPLAMBRATIO (kappa/lambda light chains)  No results found for: HGBA, HGBA2QUANT, HGBFQUANT, HGBSQUAN (Hemoglobinopathy evaluation)   No results found for: LDH  No results found for: IRON, TIBC, IRONPCTSAT (Iron and TIBC)  Lab Results  Component Value Date   FERRITIN 143 11/09/2018    Urinalysis    Component Value Date/Time   COLORURINE YELLOW 05/15/2007 0957   APPEARANCEUR CLEAR 05/15/2007 0957   LABSPEC 1.010 05/15/2007 0957   PHURINE 8.0 05/15/2007 0957   GLUCOSEU NEGATIVE 05/15/2007 0957   HGBUR NEGATIVE 05/15/2007 0957   BILIRUBINUR NEGATIVE 05/15/2007 0957   KETONESUR NEGATIVE 05/15/2007 0957   PROTEINUR NEGATIVE 05/15/2007 0957   UROBILINOGEN 0.2 05/15/2007 0957   NITRITE NEGATIVE 05/15/2007 0957   LEUKOCYTESUR  05/15/2007 0957    NEGATIVE MICROSCOPIC NOT DONE ON URINES WITH NEGATIVE PROTEIN, BLOOD, LEUKOCYTES, NITRITE, OR GLUCOSE <1000  mg/dL.     STUDIES: No results found.  ELIGIBLE FOR AVAILABLE RESEARCH PROTOCOL: no  ASSESSMENT: 53 y.o.  [place] status post left breast upper outer quadrant biopsy 06/29/2018 for a clinical T2N0 invasive ductal carcinoma, grade 2, estrogen receptor positive, progesterone receptor negative, with an MIB-1-1 of 10%, and no HER-2 amplification.  (1) genetics 07/18/2018 through the [facility] Common Hereditary Cancers Panel found no deleterious mutations in APC, ATM, AXIN2, BARD1, BMPR1A, BRCA1, BRCA2, BRIP1, CDH1, CDKN2A (p14ARF), CDKN2A (p16INK4a), CKD4, CHEK2, CTNNA1, DICER1, EPCAM (Deletion/duplication testing only), GREM1 (promoter region deletion/duplication testing only), KIT, MEN1, MLH1, MSH2, MSH3, MSH6, MUTYH, NBN, NF1, NHTL1, PALB2, PDGFRA, PMS2, POLD1, POLE, PTEN, RAD50, RAD51C, RAD51D, SDHB, SDHC, SDHD, SMAD4, SMARCA4. STK11, TP53, TSC1, TSC2, and VHL.  The following genes were evaluated for sequence changes only: SDHA and HOXB13 c.251G>A variant only.    (2) status post left lumpectomy and sentinel lymph node sampling 08/22/2018 for a pT2 pN0, stage IB invasive ductal carcinoma, grade 2, with negative margins  (a) a total of 10 axillary lymph nodes were removed  (3) Oncotype score of 34 predicts a risk of recurrence outside the breast in the next 9 years of 22% if the patient's only systemic therapy is antiestrogens for 5 years.  It also predicts significant benefit from chemotherapy.  (4) adjuvant chemotherapy consisting of cyclophosphamide and docetaxel given every 21 days x 4 beginning on 09/29/2018  (5) adjuvant radiation to follow  (6) antiestrogens to start at the completion of local treatment.  PLAN: Talley Casco is doing well today.  Her labs are stable and she will proceed with her fourth and final cycle of adjuvant chemotherapy with Docetaxel and Cyclophosphamide.  She has no peripheral neuroapthy, and has for the most part tolerated her chemotherapy well.    Audrea Muscat receives IV fluids on her Neulasta injection day and this helps with the achiness and fatigue that she experiences post chemo.  She will receive one liter of normal saline.  Danaiya Steadman still has the fungal rash underneath her breasts and in between them.  She will continue the fluconazole and Lotrisone cream.  Audrea Muscat and I reviewed the next part of her treatment plan, which is adjuvant radiation and she will meet with Dr. Lisbeth Renshaw and Bryson Ha about this next week.  Once she completes the radiation, she will return to see Dr. Jana Hakim to start anti estrogen therapy.    We will see her back in 8-10 weeks.  She was recommended to continue with the appropriate pandemic precautions. She knows to call for any questions that may arise between now and her next appointment.  We are happy to see her sooner if needed.  A total of (30) minutes of face-to-face time was spent with this patient with greater than 50% of that time in counseling and care-coordination.   Wilber Bihari, NP  Medical Oncology and Hematology [facility] [address] [place], [place] [address] Tel. [phone]    Fax. [phone]

## 2018-11-30 NOTE — Telephone Encounter (Signed)
Spoke with patient to congratulate her on her final chemo today.  She states she is so excited to be finished with this part of her treatment.  Encouraged her to call with any needs or concerns.

## 2018-11-30 NOTE — Patient Instructions (Signed)
[facility] Discharge Instructions for Patients Receiving Chemotherapy  Today you received the following chemotherapy agents: Taxotere/Cytoxan.  To help prevent nausea and vomiting after your treatment, we encourage you to take your nausea medication as directed.   If you develop nausea and vomiting that is not controlled by your nausea medication, call the clinic.   BELOW ARE SYMPTOMS THAT SHOULD BE REPORTED IMMEDIATELY:  *FEVER GREATER THAN 100.5 F  *CHILLS WITH OR WITHOUT FEVER  NAUSEA AND VOMITING THAT IS NOT CONTROLLED WITH YOUR NAUSEA MEDICATION  *UNUSUAL SHORTNESS OF BREATH  *UNUSUAL BRUISING OR BLEEDING  TENDERNESS IN MOUTH AND THROAT WITH OR WITHOUT PRESENCE OF ULCERS  *URINARY PROBLEMS  *BOWEL PROBLEMS  UNUSUAL RASH Items with * indicate a potential emergency and should be followed up as soon as possible.  Feel free to call the clinic should you have any questions or concerns. The clinic phone number is [phone].  Please show the CHEMO ALERT CARD at check-in to the Emergency Department and triage nurse.   

## 2018-11-30 NOTE — Progress Notes (Signed)
Ok to treat with elevated HR per Lindsey Causey, NP. 

## 2018-11-30 NOTE — Telephone Encounter (Signed)
I left a message regarding schedule  

## 2018-12-01 ENCOUNTER — Encounter: Payer: Self-pay | Admitting: Adult Health

## 2018-12-02 ENCOUNTER — Inpatient Hospital Stay: Payer: BC Managed Care – PPO

## 2018-12-02 VITALS — BP 112/66 | HR 85 | Temp 98.2°F | Resp 18

## 2018-12-02 DIAGNOSIS — Z95828 Presence of other vascular implants and grafts: Secondary | ICD-10-CM

## 2018-12-02 DIAGNOSIS — C50412 Malignant neoplasm of upper-outer quadrant of left female breast: Secondary | ICD-10-CM | POA: Diagnosis not present

## 2018-12-02 DIAGNOSIS — Z170 Estrogen receptor positive status [ER+]: Secondary | ICD-10-CM

## 2018-12-02 MED ORDER — SODIUM CHLORIDE 0.9% FLUSH
10.0000 mL | INTRAVENOUS | Status: DC | PRN
  Administered 2018-12-02: 10 mL
  Filled 2018-12-02: qty 10

## 2018-12-02 MED ORDER — HEPARIN SOD (PORK) LOCK FLUSH 100 UNIT/ML IV SOLN
500.0000 [IU] | Freq: Once | INTRAVENOUS | Status: AC | PRN
  Administered 2018-12-02: 500 [IU]
  Filled 2018-12-02: qty 5

## 2018-12-02 MED ORDER — SODIUM CHLORIDE 0.9 % IV SOLN
INTRAVENOUS | Status: DC
  Administered 2018-12-02: 11:00:00 via INTRAVENOUS
  Filled 2018-12-02 (×2): qty 250

## 2018-12-02 NOTE — Patient Instructions (Signed)
Rehydration, Adult °Rehydration is the replacement of body fluids and salts and minerals (electrolytes) that are lost during dehydration. Dehydration is when there is not enough fluid or water in the body. This happens when you lose more fluids than you take in. Common causes of dehydration include: °· Vomiting. °· Diarrhea. °· Excessive sweating, such as from heat exposure or exercise. °· Taking medicines that cause the body to lose excess fluid (diuretics). °· Impaired kidney function. °· Not drinking enough fluid. °· Certain illnesses or infections. °· Certain poorly controlled long-term (chronic) illnesses, such as diabetes, heart disease, and kidney disease. ° °Symptoms of mild dehydration may include thirst, dry lips and mouth, dry skin, and dizziness. Symptoms of severe dehydration may include increased heart rate, confusion, fainting, and not urinating. °You can rehydrate by drinking certain fluids or getting fluids through an IV tube, as told by your health care provider. °What are the risks? °Generally, rehydration is safe. However, one problem that can happen is taking in too much fluid (overhydration). This is rare. If overhydration happens, it can cause an electrolyte imbalance, kidney failure, or a decrease in salt (sodium) levels in the body. °How to rehydrate °Follow instructions from your health care provider for rehydration. The kind of fluid you should drink and the amount you should drink depend on your condition. °· If directed by your health care provider, drink an oral rehydration solution (ORS). This is a drink designed to treat dehydration that is found in pharmacies and retail stores. °? Make an ORS by following instructions on the package. °? Start by drinking small amounts, about ½ cup (120 mL) every 5-10 minutes. °? Slowly increase how much you drink until you have taken the amount recommended by your health care provider. °· Drink enough clear fluids to keep your urine clear or pale  yellow. If you were instructed to drink an ORS, finish the ORS first, then start slowly drinking other clear fluids. Drink fluids such as: °? Water. Do not drink only water. Doing that can lead to having too little sodium in your body (hyponatremia). °? Ice chips. °? Fruit juice that you have added water to (diluted juice). °? Low-calorie sports drinks. °· If you are severely dehydrated, your health care provider may recommend that you receive fluids through an IV tube in the hospital. °· Do not take sodium tablets. Doing that can lead to the condition of having too much sodium in your body (hypernatremia). °Eating while you rehydrate °Follow instructions from your health care provider about what to eat while you rehydrate. Your health care provider may recommend that you slowly begin eating regular foods in small amounts. °· Eat foods that contain a healthy balance of electrolytes, such as bananas, oranges, potatoes, tomatoes, and spinach. °· Avoid foods that are greasy or contain a lot of fat or sugar. ° °In some cases, you may get nutrition through a feeding tube that is passed through your nose and into your stomach (nasogastric tube, or NG tube). This may be done if you have uncontrolled vomiting or diarrhea. °Beverages to avoid °Certain beverages may make dehydration worse. While you rehydrate, avoid: °· Alcohol. °· Caffeine. °· Drinks that contain a lot of sugar. These include: °? High-calorie sports drinks. °? Fruit juice that is not diluted. °? Soda. ° °Check nutrition labels to see how much sugar or caffeine a beverage contains. °Signs of dehydration recovery °You may be recovering from dehydration if: °· You are urinating more often than before you started   rehydrating. °· Your urine is clear or pale yellow. °· Your energy level improves. °· You vomit less frequently. °· You have diarrhea less frequently. °· Your appetite improves or returns to normal. °· You feel less dizzy or less light-headed. °· Your  skin tone and color start to look more normal. °Contact a health care provider if: °· You continue to have symptoms of mild dehydration, such as: °? Thirst. °? Dry lips. °? Slightly dry mouth. °? Dry, warm skin. °? Dizziness. °· You continue to vomit or have diarrhea. °Get help right away if: °· You have symptoms of dehydration that get worse. °· You feel: °? Confused. °? Weak. °? Like you are going to faint. °· You have not urinated in 6-8 hours. °· You have very dark urine. °· You have trouble breathing. °· Your heart rate while sitting still is over 100 beats a minute. °· You cannot drink fluids without vomiting. °· You have vomiting or diarrhea that: °? Gets worse. °? Does not go away. °· You have a fever. °This information is not intended to replace advice given to you by your health care provider. Make sure you discuss any questions you have with your health care provider. °Document Released: 03/29/2011 Document Revised: 12/17/2016 Document Reviewed: 02/28/2015 °Elsevier Patient Education © 2020 Else[employer] Inc. ° °Pegfilgrastim injection °What is this medicine? °PEGFILGRASTIM (PEG fil gra stim) is a long-acting granulocyte colony-stimulating factor that stimulates the growth of neutrophils, a type of white blood cell important in the body's fight against infection. It is used to reduce the incidence of fever and infection in patients with certain types of cancer who are receiving chemotherapy that affects the bone marrow, and to increase survival after being exposed to high doses of radiation. °This medicine may be used for other purposes; ask your health care provider or pharmacist if you have questions. °COMMON BRAND NAME(S): Fulphila, Neulasta, UDENYCA, Ziextenzo °What should I tell my health care provider before I take this medicine? °They need to know if you have any of these conditions: °· kidney disease °· latex allergy °· ongoing radiation therapy °· sickle cell disease °· skin reactions to acrylic  adhesives (On-Body Injector only) °· an unusual or allergic reaction to pegfilgrastim, filgrastim, other medicines, foods, dyes, or preservatives °· pregnant or trying to get pregnant °· breast-feeding °How should I use this medicine? °This medicine is for injection under the skin. If you get this medicine at home, you will be taught how to prepare and give the pre-filled syringe or how to use the On-body Injector. Refer to the patient Instructions for Use for detailed instructions. Use exactly as directed. Tell your healthcare provider immediately if you suspect that the On-body Injector may not have performed as intended or if you suspect the use of the On-body Injector resulted in a missed or partial dose. °It is important that you put your used needles and syringes in a special sharps container. Do not put them in a trash can. If you do not have a sharps container, call your pharmacist or healthcare provider to get one. °Talk to your pediatrician regarding the use of this medicine in children. While this drug may be prescribed for selected conditions, precautions do apply. °Overdosage: If you think you have taken too much of this medicine contact a poison control center or emergency room at once. °NOTE: This medicine is only for you. Do not share this medicine with others. °What if I miss a dose? °It is important not to   miss your dose. Call your doctor or health care professional if you miss your dose. If you miss a dose due to an On-body Injector failure or leakage, a new dose should be administered as soon as possible using a single prefilled syringe for manual use. °What may interact with this medicine? °Interactions have not been studied. °Give your health care provider a list of all the medicines, herbs, non-prescription drugs, or dietary supplements you use. Also tell them if you smoke, drink alcohol, or use illegal drugs. Some items may interact with your medicine. °This list may not describe all possible  interactions. Give your health care provider a list of all the medicines, herbs, non-prescription drugs, or dietary supplements you use. Also tell them if you smoke, drink alcohol, or use illegal drugs. Some items may interact with your medicine. °What should I watch for while using this medicine? °You may need blood work done while you are taking this medicine. °If you are going to need a MRI, CT scan, or other procedure, tell your doctor that you are using this medicine (On-Body Injector only). °What side effects may I notice from receiving this medicine? °Side effects that you should report to your doctor or health care professional as soon as possible: °· allergic reactions like skin rash, itching or hives, swelling of the face, lips, or tongue °· back pain °· dizziness °· fever °· pain, redness, or irritation at site where injected °· pinpoint red spots on the skin °· red or dark-brown urine °· shortness of breath or breathing problems °· stomach or side pain, or pain at the shoulder °· swelling °· tiredness °· trouble passing urine or change in the amount of urine °Side effects that usually do not require medical attention (report to your doctor or health care professional if they continue or are bothersome): °· bone pain °· muscle pain °This list may not describe all possible side effects. Call your doctor for medical advice about side effects. You may report side effects to FDA at 1-800-FDA-1088. °Where should I keep my medicine? °Keep out of the reach of children. °If you are using this medicine at home, you will be instructed on how to store it. Throw away any unused medicine after the expiration date on the label. °NOTE: This sheet is a summary. It may not cover all possible information. If you have questions about this medicine, talk to your doctor, pharmacist, or health care provider. °© 2020 Elsevier/Gold Standard (2017-04-11 16:57:08) ° ° °

## 2018-12-04 NOTE — Telephone Encounter (Signed)
Medication refill for lorazepam called in to patient's pharmacy.

## 2018-12-05 ENCOUNTER — Telehealth: Payer: Self-pay | Admitting: Oncology

## 2018-12-06 ENCOUNTER — Other Ambulatory Visit: Payer: Self-pay

## 2018-12-06 ENCOUNTER — Ambulatory Visit: Payer: BC Managed Care – PPO | Admitting: Radiation Oncology

## 2018-12-06 ENCOUNTER — Ambulatory Visit
Admission: RE | Admit: 2018-12-06 | Discharge: 2018-12-06 | Disposition: A | Discharge status: Home / Self Care | Payer: BC Managed Care – PPO | Source: Ambulatory Visit | Attending: Radiation Oncology | Admitting: Radiation Oncology

## 2018-12-06 DIAGNOSIS — Z170 Estrogen receptor positive status [ER+]: Secondary | ICD-10-CM

## 2018-12-06 DIAGNOSIS — C50412 Malignant neoplasm of upper-outer quadrant of left female breast: Secondary | ICD-10-CM

## 2018-12-06 NOTE — Progress Notes (Signed)
Radiation Oncology         ([phone]) [phone] ________________________________  Name: Medina Degraffenreid        MRN: [id number]  Date of Service: 12/06/2018 DOB: 10-29-1965  HW:[id number], Ronie Spies, MD  [facility] Cornett*     REFERRING PHYSICIAN: Wilber Bihari Cornett*   DIAGNOSIS: The encounter diagnosis was Malignant neoplasm of upper-outer quadrant of left breast in female, estrogen receptor positive ([place]).   HISTORY OF PRESENT ILLNESS: Jung Ingerson is a 53 y.o. female with a history of left breast cancer.  The patient has a history of a cystic mass possibly papilloma that was detected on a screening mammogram at an outside facility in 2018.  There was a 1 cm circumscribed mass in the right breast which had not changed significantly in retrospect to 2014.  Fibrocystic changes were noted along the left breast in 2019 also at the outside facility.  She did undergo a ultrasound-guided left breast biopsy on 03/18/2017 in the retroareolar region of the left breast, and biopsy was consistent with fibrocystic change.  She has been followed closely since and had a bilateral diagnostic mammogram on 06/27/2018.  This revealed a 1 cm circumscribed mass in the immediate subareolar left breast with postbiopsy marker adjacent to it, and a new group of pleomorphic calcifications in low upper outer quadrant of the left breast measuring 3.1 x 1.2 x 1.7 cm.  No palpable masses were noted.  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  She proceeded with lumpectomy with sentinel node biopsy on 08/22/2018 that revealed a 2.8 cm grade 2 invasive ductal carcinoma with intermediate grade DCIS and necrosis. Her final margins were negative  though the specimens contained some ADH, PASH, and fibrocystic changes and of the 10 sampled nodes, none contained any metastatic disease. She had a postoperative Oncotype Dx result of 34. She was counseled on the rationale for systemic chemotherapy and began Cytoxan/Taxotere on 09/29/2018. She completed this regimen on 11/30/2018. She is seen today via telemedicine encounter to discuss adjuvant radiotherapy.   PREVIOUS RADIATION THERAPY: No   PAST MEDICAL HISTORY:  Past Medical History:  Diagnosis Date   ASCUS (atypical squamous cells of undetermined significance) on Pap smear    Asthma    controlled with daily inhaler   Breast cancer ([place]) 06/29/2018   Left IDC   Dysplasia of cervix, low grade (CIN 1)    Eczema    Family history of breast cancer    Family history of lung cancer    Family history of prostate cancer    GERD (gastroesophageal reflux disease)    Heart murmur    History of chicken pox    Hypertension    Hypothyroid    Uterine fibroid        PAST SURGICAL HISTORY: Past Surgical History:  Procedure Laterality Date   ADENOIDECTOMY     BREAST LUMPECTOMY WITH RADIOACTIVE SEED AND SENTINEL LYMPH NODE BIOPSY Left 08/22/2018   Procedure: LEFT BREAST LUMPECTOMY WITH RADIOACTIVE SEED AND SENTINEL LYMPH NODE MAPPING;  Surgeon: Erroll Luna, MD;  Location: [facility];  Service: General;  Laterality: Left;   LEEP     MYOMECTOMY     PORTACATH PLACEMENT Right 09/27/2018   Procedure: INSERTION  PORT-A-CATH WITH ULTRASOUND;  Surgeon: Erroll Luna, MD;  Location: [facility];  Service: General;  Laterality: Right;   TONSILECTOMY, ADENOIDECTOMY, BILATERAL MYRINGOTOMY AND TUBES     TONSILLECTOMY       FAMILY HISTORY:  Family History  Problem Relation Age of Onset   Breast cancer Maternal Grandmother    Heart disease Father    Diabetes Father    Prostate cancer Father 51   Breast cancer Mother 64   Hypertension Other    Lung cancer  Maternal Aunt    Allergic rhinitis Neg Hx    Angioedema Neg Hx    Asthma Neg Hx    Eczema Neg Hx    Immunodeficiency Neg Hx    Urticaria Neg Hx      SOCIAL HISTORY:  reports that she has never smoked. She has never used smokeless tobacco. She reports that she does not drink alcohol or use drugs. The patient is married and lives in [place]. 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 They do not have any children.    ALLERGIES: Apple, Iohexol, Other, Povidone-iodine, Shellfish allergy, Peach [prunus persica], Zocor [simvastatin], Betadine [povidone iodine], and Latex   MEDICATIONS:  Current Outpatient Medications  Medication Sig Dispense Refill   albuterol (VENTOLIN HFA) 108 (90 Base) MCG/ACT inhaler Use 2 puffs every four hours as needed for cough or wheeze.  May use 2 puffs 10-20 minutes prior to exercise. 1 Inhaler 1   budesonide-formoterol (SYMBICORT) 160-4.5 MCG/ACT inhaler INHALE 2 PUFFS INTO THE LUNGS 2 (TWO) TIMES DAILY. (Patient taking differently: Inhale 2 puffs into the lungs daily. ) 10.2 g 5   Cholecalciferol (VITAMIN D3) 50 MCG (2000 UT) TABS Take 2,000 Units by mouth every evening.     clotrimazole-betamethasone (LOTRISONE) cream APPLY EXTERNALLY TO THE AFFECTED AREA TWICE DAILY 60 g 0   dexamethasone (DECADRON) 4 MG tablet Take 2 tablets (8 mg total) by mouth 2 (two) times daily. Start the day before Taxotere. Then again the day after chemo for 3 days. 30 tablet 1   EPINEPHrine (EPIPEN 2-PAK) 0.3 mg/0.3 mL IJ SOAJ injection Use as directed for a threatening allergic reaction (Patient taking differently: Inject 0.3 mg into the muscle as needed for anaphylaxis. Use as directed for a threatening allergic reaction) 4 Device 1   fluconazole (DIFLUCAN) 200 MG tablet Take 1 tablet (200 mg total) by mouth daily. 60 tablet 1   fluticasone (FLONASE) 50 MCG/ACT nasal spray USE 2 SPRAYS IN EACH NOSTRIL ONCE DAILY FOR STUFFY NOSE OR DRAINAGE (Patient taking  differently: Place 2 sprays into both nostrils daily. USE 2 SPRAYS IN EACH NOSTRIL ONCE DAILY FOR STUFFY NOSE OR DRAINAG) 16 g 5   folic acid (FOLVITE) 1 MG tablet Take 1 mg by mouth every evening.      hydrochlorothiazide (MICROZIDE) 12.5 MG capsule Take 12.5 mg by mouth daily.   1   levocetirizine (XYZAL) 5 MG tablet Take 5 mg by mouth daily.     levothyroxine (SYNTHROID, LEVOTHROID) 112 MCG tablet Take 112 mcg by mouth daily before breakfast.      lidocaine-prilocaine (EMLA) cream Apply to affected area once 30 g 3   LORazepam (ATIVAN) 0.5 MG tablet Take 1 tablet (0.5 mg total) by mouth at bedtime as needed (Nausea or vomiting). 30 tablet 0   montelukast (SINGULAIR) 10 MG tablet Take 1 tablet (10 mg total) by mouth at bedtime. 30 tablet 0   NIFEdipine (PROCARDIA XL/ADALAT-CC) 60 MG 24 hr tablet Take 60 mg by  mouth daily.  1   Olopatadine HCl (PAZEO) 0.7 % SOLN Apply 1 drop to eye daily. (Patient taking differently: Apply 1 drop to eye daily as needed (allergies.). ) 2.5 mL 5   prochlorperazine (COMPAZINE) 10 MG tablet Take 1 tablet (10 mg total) by mouth every 6 (six) hours as needed (Nausea or vomiting). 30 tablet 1   triamcinolone (KENALOG) 0.025 % ointment Apply 1 application topically 2 (two) times daily. 30 g 0   No current facility-administered medications for this encounter.      REVIEW OF SYSTEMS: On review of systems, the patient reports that she is doing well overall. She denies any chest pain, shortness of breath, cough, fevers, chills, night sweats, unintended weight changes. She denies any bowel or bladder disturbances, and denies abdominal pain, nausea or vomiting. She denies any new musculoskeletal or joint aches or pains. A complete review of systems is obtained and is otherwise negative.     PHYSICAL EXAM:  Unable to assess vitals due to encounter type.  In general this is a well appearing African American female in no acute distress. she's alert and oriented x4  and appropriate throughout the examination. Cardiopulmonary assessment is negative for acute distress and she exhibits normal effort. Breast exam is deferred.   ECOG = 0  0 - Asymptomatic (Fully active, able to carry on all predisease activities without restriction)  1 - Symptomatic but completely ambulatory (Restricted in physically strenuous activity but ambulatory and able to carry out work of a light or sedentary nature. For example, light housework, office work)  2 - Symptomatic, <50% in bed during the day (Ambulatory and capable of all self care but unable to carry out any work activities. Up and about more than 50% of waking hours)  3 - Symptomatic, >50% in bed, but not bedbound (Capable of only limited self-care, confined to bed or chair 50% or more of waking hours)  4 - Bedbound (Completely disabled. Cannot carry on any self-care. Totally confined to bed or chair)  5 - Death   Eustace Pen MM, Creech RH, Tormey DC, et al. [phone]). "Toxicity and response criteria of the [facility] Group". [place] Oncol. 5 (6): 649-55    LABORATORY DATA:  Lab Results  Component Value Date   WBC 22.2 (H) 11/30/2018   HGB 11.8 (L) 11/30/2018   HCT 35.4 (L) 11/30/2018   MCV 82.7 11/30/2018   PLT 266 11/30/2018   Lab Results  Component Value Date   NA 141 11/30/2018   K 3.9 11/30/2018   CL 106 11/30/2018   CO2 23 11/30/2018   Lab Results  Component Value Date   ALT 17 11/30/2018   AST 9 (L) 11/30/2018   ALKPHOS 94 11/30/2018   BILITOT 0.4 11/30/2018      RADIOGRAPHY: No results found.     IMPRESSION/PLAN: 1. 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 Dr. Lisbeth Renshaw discusses the pathology findings and reviews the nature of invasive left breast disease.  She has done well surgically and has also completed her chemotherapy. She is ready to proceed with adjuvant radiotherapy to reduce the risks of local recurrence. She will also  receive adjuvant antiestrogen therapy. We discussed the risks, benefits, short, and long term effects of radiotherapy, and the patient is interested in proceeding. Dr. Lisbeth Renshaw discusses the delivery and logistics of radiotherapy and anticipates a course of 4-6 1/2 weeks of radiotherapy to the left breast with deep inspiration breath  hold technique. After consideration of all the options, she is going to discuss with her husband and let us know. She is scheduled to come in today for simulation but it is too soon to proceed. We will move her simulation to 12/20/2018. She is in agreement with this overall plan.  This encounter was provided by telemedicine platform MyChart but had to transition to telephone call due to connectivity issies.  The patient has given verbal consent for this type of encounter and has been advised to only accept a meeting of this type in a secure network environment. The time spent during this encounter was 45 minutes. The attendants for this meeting include Dr. Lisbeth Renshaw, Hayden Pedro  and [name].  During the encounter,  Dr. Lisbeth Renshaw, and Hayden Pedro were located at [facility] Radiation Oncology Department.  Juleen Sorrels was located at home.    The above documentation reflects my direct findings during this shared patient visit. Please see the separate note by Dr. Lisbeth Renshaw on this date for the remainder of the patient's plan of care.    Carola Rhine, PAC

## 2018-12-07 ENCOUNTER — Encounter: Payer: Self-pay | Admitting: Adult Health

## 2018-12-11 ENCOUNTER — Other Ambulatory Visit: Payer: Self-pay

## 2018-12-11 ENCOUNTER — Encounter: Payer: Self-pay | Admitting: Adult Health

## 2018-12-11 ENCOUNTER — Ambulatory Visit: Payer: BC Managed Care – PPO | Admitting: Oncology

## 2018-12-11 ENCOUNTER — Inpatient Hospital Stay: Payer: BC Managed Care – PPO | Admitting: Adult Health

## 2018-12-11 ENCOUNTER — Other Ambulatory Visit: Payer: BC Managed Care – PPO

## 2018-12-11 ENCOUNTER — Encounter: Payer: Self-pay | Admitting: *Deleted

## 2018-12-11 ENCOUNTER — Telehealth: Payer: Self-pay | Admitting: Allergy

## 2018-12-11 VITALS — BP 123/87 | HR 108 | Temp 97.8°F | Resp 18 | Ht 66.0 in | Wt 231.1 lb

## 2018-12-11 DIAGNOSIS — Z170 Estrogen receptor positive status [ER+]: Secondary | ICD-10-CM

## 2018-12-11 DIAGNOSIS — J4540 Moderate persistent asthma, uncomplicated: Secondary | ICD-10-CM

## 2018-12-11 DIAGNOSIS — C50412 Malignant neoplasm of upper-outer quadrant of left female breast: Secondary | ICD-10-CM | POA: Diagnosis not present

## 2018-12-11 MED ORDER — MONTELUKAST SODIUM 10 MG PO TABS: 10.0000 mg | ORAL_TABLET | Freq: Every day | ORAL | 0 refills | Status: DC

## 2018-12-11 NOTE — Patient Instructions (Signed)
Venlafaxine tablets What is this medicine? VENLAFAXINE (VEN la fax een) is used to treat depression, anxiety and panic disorder. This medicine may be used for other purposes; ask your health care provider or pharmacist if you have questions. COMMON BRAND NAME(S): Effexor What should I tell my health care provider before I take this medicine? They need to know if you have any of these conditions:  bleeding disorders  glaucoma  heart disease  high blood pressure  high cholesterol  kidney disease  liver disease  low levels of sodium in the blood  mania or bipolar disorder  seizures  suicidal thoughts, plans, or attempt; a previous suicide attempt by you or a family  take medicines that treat or prevent blood clots  thyroid disease  an unusual or allergic reaction to venlafaxine, desvenlafaxine, other medicines, foods, dyes, or preservatives  pregnant or trying to get pregnant  breast-feeding How should I use this medicine? Take this medicine by mouth with a glass of water. Follow the directions on the prescription label. Take it with food. Take your medicine at regular intervals. Do not take your medicine more often than directed. Do not stop taking this medicine suddenly except upon the advice of your doctor. Stopping this medicine too quickly may cause serious side effects or your condition may worsen. A special MedGuide will be given to you by the pharmacist with each prescription and refill. Be sure to read this information carefully each time. Talk to your pediatrician regarding the use of this medicine in children. Special care may be needed. Overdosage: If you think you have taken too much of this medicine contact a poison control center or emergency room at once. NOTE: This medicine is only for you. Do not share this medicine with others. What if I miss a dose? If you miss a dose, take it as soon as you can. If it is almost time for your next dose, take only that  dose. Do not take double or extra doses. What may interact with this medicine? Do not take this medicine with any of the following medications:  certain medicines for fungal infections like fluconazole, itraconazole, ketoconazole, posaconazole, voriconazole  cisapride  desvenlafaxine  dronedarone  duloxetine  levomilnacipran  linezolid  MAOIs like Carbex, Eldepryl, Marplan, Nardil, and Parnate  methylene blue (injected into a vein)  milnacipran  pimozide  thioridazine This medicine may also interact with the following medications:  amphetamines  aspirin and aspirin-like medicines  certain medicines for depression, anxiety, or psychotic disturbances  certain medicines for migraine headaches like almotriptan, eletriptan, frovatriptan, naratriptan, rizatriptan, sumatriptan, zolmitriptan  certain medicines for sleep  certain medicines that treat or prevent blood clots like dalteparin, enoxaparin, warfarin  cimetidine  clozapine  diuretics  fentanyl  furazolidone  indinavir  isoniazid  lithium  metoprolol  NSAIDS, medicines for pain and inflammation, like ibuprofen or naproxen  other medicines that prolong the QT interval (cause an abnormal heart rhythm) like dofetilide, ziprasidone  procarbazine  rasagiline  supplements like St. John's wort, kava kava, valerian  tramadol  tryptophan This list may not describe all possible interactions. Give your health care provider a list of all the medicines, herbs, non-prescription drugs, or dietary supplements you use. Also tell them if you smoke, drink alcohol, or use illegal drugs. Some items may interact with your medicine. What should I watch for while using this medicine? Tell your doctor if your symptoms do not get better or if they get worse. Visit your doctor or health care  professional for regular checks on your progress. Because it may take several weeks to see the full effects of this medicine, it  is important to continue your treatment as prescribed by your doctor. Patients and their families should watch out for new or worsening thoughts of suicide or depression. Also watch out for sudden changes in feelings such as feeling anxious, agitated, panicky, irritable, hostile, aggressive, impulsive, severely restless, overly excited and hyperactive, or not being able to sleep. If this happens, especially at the beginning of treatment or after a change in dose, call your health care professional. This medicine can cause an increase in blood pressure. Check with your doctor for instructions on monitoring your blood pressure while taking this medicine. You may get drowsy or dizzy. Do not drive, use machinery, or do anything that needs mental alertness until you know how this medicine affects you. Do not stand or sit up quickly, especially if you are an older patient. This reduces the risk of dizzy or fainting spells. Alcohol may interfere with the effect of this medicine. Avoid alcoholic drinks. Your mouth may get dry. Chewing sugarless gum, sucking hard candy and drinking plenty of water will help. Contact your doctor if the problem does not go away or is severe. What side effects may I notice from receiving this medicine? Side effects that you should report to your doctor or health care professional as soon as possible:  allergic reactions like skin rash, itching or hives, swelling of the face, lips, or tongue  anxious  breathing problems  confusion  changes in vision  chest pain  confusion  elevated mood, decreased need for sleep, racing thoughts, impulsive behavior  eye pain  fast, irregular heartbeat  feeling faint or lightheaded, falls  feeling agitated, angry, or irritable  hallucination, loss of contact with reality  high blood pressure  loss of balance or coordination  palpitations  redness, blistering, peeling or loosening of the skin, including inside the  mouth  restlessness, pacing, inability to keep still  seizures  stiff muscles  suicidal thoughts or other mood changes  trouble passing urine or change in the amount of urine  trouble sleeping  unusual bleeding or bruising  unusually weak or tired  vomiting Side effects that usually do not require medical attention (report to your doctor or health care professional if they continue or are bothersome):  change in sex drive or performance  change in appetite or weight  constipation  dizziness  dry mouth  headache  increased sweating  nausea  tired This list may not describe all possible side effects. Call your doctor for medical advice about side effects. You may report side effects to FDA at 1-800-FDA-1088. Where should I keep my medicine? Keep out of the reach of children. Store at a controlled temperature between 20 and 25 degrees C (68 and 77 degrees F), in a dry place. Throw away any unused medicine after the expiration date. NOTE: This sheet is a summary. It may not cover all possible information. If you have questions about this medicine, talk to your doctor, pharmacist, or health care provider.  2020 [employer] (2017-12-27 12:08:23)

## 2018-12-11 NOTE — Telephone Encounter (Signed)
Prescription refill has been sent in as requested.

## 2018-12-11 NOTE — Telephone Encounter (Signed)
Patient called to schedule an office visit with Dr. Nelva Bush on 02/02/2019 at 11:20am and would like to know if the prescription for Singulair could be refilled.   Please advise.

## 2018-12-11 NOTE — Telephone Encounter (Signed)
Patient scheduled to see [facility]/NP today at 3 pm.

## 2018-12-11 NOTE — Progress Notes (Signed)
[facility]  Telephone:([phone]) [phone] [phone]) [phone]     ID: Veronica Reilly DOB: 14-Dec-1965  MR#: [id number]  JJH#:[id number]  Patient Care Team: Leeroy Cha, MD as PCP - General (Internal Medicine) Magrinat, Virgie Dad, MD as Consulting Physician (Oncology) Kyung Rudd, MD as Consulting Physician (Radiation Oncology) Erroll Luna, MD as Consulting Physician (General Surgery) Delsa Bern, MD as Consulting Physician (Obstetrics and Gynecology) Kennith Gain, MD as Consulting Physician (Allergy) Delrae Rend, MD as Consulting Physician (Endocrinology) Rockwell Germany, RN as Oncology Nurse Navigator Mauro Kaufmann, RN as Oncology Nurse Navigator Scot Dock, NP OTHER MD:  CHIEF COMPLAINT: estrogen receptor positive breast cancer  CURRENT TREATMENT: s/p chemotherapy, adjuvant radiation pending.    INTERVAL HISTORY: Veronica Reilly returns today for an urgent evaluation since completing her chemotherapy.  She notes that she has some tachycardia.  This feeling is improved.  She notes that some mornings are worse than others.    REVIEW OF SYSTEMS: Veronica Reilly has some lorazepam she can take at night if her mind is racing.  She has some gum irritation.  She notes that she has a lot of increased stress about coming to the cancer center and undergoing treatment and her diagnosis because her mother died from breast cancer.  She has been unable to do things she normally did well and without any issues. Now it is taking her logner to do those things and it is frustrating for her.    HISTORY OF CURRENT ILLNESS: From the original intake note:  Veronica Reilly had some calcifications in the left breast noted March 2019 on screening mammography.. She underwent biopsy of the left retroareolar area of concern on 03/18/2017 with pathology ([id number]) showing fibrocystic changes with no malignancy. Return in 6 months for left diagnostic mammography and  ultrasonography was recommended, and she reports this took place in November 2019 at her gynecologist's office and the changes previously noted were stable.  She underwent bilateral diagnostic mammography with tomography and left breast ultrasonography at [facility] on 06/27/2018 showing: breast density category C; left breast upper-outer quadrant 3.1 cm group of indeterminate calcifications; persistent benign-appearing cyst in the subareolar left breast, post core needle biopsy changes.  On physical exam there were no palpable masses.  Targeted ultrasound revealed no suspicious masses.  There was a left subareolar breast cyst measuring 1.0 cm.  Left axilla was unremarkable.  Accordingly on 06/29/2018 she proceeded to biopsy of the left breast calcifications area in question. The pathology from this procedure ([id number]) showed: invasive ductal carcinoma, grade 2, with ductal carcinoma in situ. Prognostic indicators significant for: estrogen receptor, 100% positive with strong staining intensity and progesterone receptor, 0% negative. Proliferation marker Ki67 at 10%. HER2 negtive by immunohistochemistry, (0).  The patient's subsequent history is as detailed below.   PAST MEDICAL HISTORY: Past Medical History:  Diagnosis Date   ASCUS (atypical squamous cells of undetermined significance) on Pap smear    Asthma    controlled with daily inhaler   Breast cancer ([facility]) 06/29/2018   Left IDC   Dysplasia of cervix, low grade (CIN 1)    Eczema    Family history of breast cancer    Family history of lung cancer    Family history of prostate cancer    GERD (gastroesophageal reflux disease)    Heart murmur    History of chicken pox    Hypertension    Hypothyroid    Uterine fibroid   Heart murmur, evaluated by cardiologist  PAST SURGICAL HISTORY: Past Surgical History:  Procedure Laterality Date   ADENOIDECTOMY     BREAST LUMPECTOMY WITH RADIOACTIVE SEED AND SENTINEL  LYMPH NODE BIOPSY Left 08/22/2018   Procedure: LEFT BREAST LUMPECTOMY WITH RADIOACTIVE SEED AND SENTINEL LYMPH NODE MAPPING;  Surgeon: Erroll Luna, MD;  Location: [facility];  Service: General;  Laterality: Left;   LEEP     MYOMECTOMY     PORTACATH PLACEMENT Right 09/27/2018   Procedure: INSERTION PORT-A-CATH WITH ULTRASOUND;  Surgeon: Erroll Luna, MD;  Location: [facility];  Service: General;  Laterality: Right;   TONSILECTOMY, ADENOIDECTOMY, BILATERAL MYRINGOTOMY AND TUBES     TONSILLECTOMY      FAMILY HISTORY: Family History  Problem Relation Age of Onset   Breast cancer Maternal Grandmother    Heart disease Father    Diabetes Father    Prostate cancer Father 38   Breast cancer Mother 65   Hypertension Other    Lung cancer Maternal Aunt    Allergic rhinitis Neg Hx    Angioedema Neg Hx    Asthma Neg Hx    Eczema Neg Hx    Immunodeficiency Neg Hx    Urticaria Neg Hx    Patient's father was 65 years old when he died from diabetes and renal failure. Patient's mother died from breast cancer at age 41. The patient notes a family hx of breast and possibly ovarian cancer. Her mother was diagnosed with inflammatory breast cancer at around age 69 (she was a patient here), and the patient maternal grandmother was also diagnosed with breast cancer and died at age 77.  The patient has 1 brother, no sisters. She also had one maternal aunt with lung cancer and a history of smoking, her father with prostate cancer at age 34, and a possible GYN cancer in a paternal aunt.   GYNECOLOGIC HISTORY:  Last menstrual period February 2020  menarche: 53 years old GX P 0, she has had fertility treatments LMP 05/2018, lasted 3 days, irregular (s/p ablation) Contraceptive: used for apprx. 6 months HRT never used  Hysterectomy? no BSO? 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 She is married.  Husband Veronica Reilly is a trauma nurse at [employer]. She lives at home with her husband and some hermit crabs and salt and fresh water fish. 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    ADVANCED DIRECTIVES: Husband Veronica Reilly is her HCPOA.   HEALTH MAINTENANCE: Social History   Tobacco Use   Smoking status: Never Smoker   Smokeless tobacco: Never Used  Substance Use Topics   Alcohol use: No   Drug use: No     Colonoscopy: Pending  PAP: Up-to-date  Bone density: never done   Allergies  Allergen Reactions   Apple Anaphylaxis   Iohexol     Contrast dye   Other Shortness Of Breath    CHERRIES, TREES, MOLD,DUST, COCKROACHES   Povidone-Iodine Anaphylaxis   Shellfish Allergy Anaphylaxis   Peach [Prunus Persica] Itching and Hives    mouth   Zocor [Simvastatin] Other (See Comments)    Alopecia   Betadine [Povidone Iodine]     Due to shellfish allergy   Latex Rash    Current Outpatient Medications  Medication Sig Dispense Refill   albuterol (VENTOLIN HFA) 108 (90 Base) MCG/ACT inhaler Use 2 puffs every four hours as needed for cough or  wheeze.  May use 2 puffs 10-20 minutes prior to exercise. 1 Inhaler 1   budesonide-formoterol (SYMBICORT) 160-4.5 MCG/ACT inhaler INHALE 2 PUFFS INTO THE LUNGS 2 (TWO) TIMES DAILY. (Patient taking differently: Inhale 2 puffs into the lungs daily. ) 10.2 g 5   Cholecalciferol (VITAMIN D3) 50 MCG (2000 UT) TABS Take 2,000 Units by mouth every evening.     clotrimazole-betamethasone (LOTRISONE) cream APPLY EXTERNALLY TO THE AFFECTED AREA TWICE DAILY 60 g 0   dexamethasone (DECADRON) 4 MG tablet Take 2 tablets (8 mg total) by mouth 2 (two) times daily. Start the day before Taxotere. Then again the day after chemo for 3 days. (Patient not taking: Reported on 12/06/2018) 30 tablet 1   EPINEPHrine (EPIPEN 2-PAK) 0.3 mg/0.3 mL IJ SOAJ injection Use as directed for a threatening allergic  reaction (Patient taking differently: Inject 0.3 mg into the muscle as needed for anaphylaxis. Use as directed for a threatening allergic reaction) 4 Device 1   fluconazole (DIFLUCAN) 200 MG tablet Take 1 tablet (200 mg total) by mouth daily. 60 tablet 1   fluticasone (FLONASE) 50 MCG/ACT nasal spray USE 2 SPRAYS IN EACH NOSTRIL ONCE DAILY FOR STUFFY NOSE OR DRAINAGE (Patient taking differently: Place 2 sprays into both nostrils daily. USE 2 SPRAYS IN EACH NOSTRIL ONCE DAILY FOR STUFFY NOSE OR DRAINAG) 16 g 5   folic acid (FOLVITE) 1 MG tablet Take 1 mg by mouth every evening.      hydrochlorothiazide (MICROZIDE) 12.5 MG capsule Take 12.5 mg by mouth daily.   1   levocetirizine (XYZAL) 5 MG tablet Take 5 mg by mouth daily.     levothyroxine (SYNTHROID, LEVOTHROID) 112 MCG tablet Take 112 mcg by mouth daily before breakfast.      lidocaine-prilocaine (EMLA) cream Apply to affected area once 30 g 3   LORazepam (ATIVAN) 0.5 MG tablet Take 1 tablet (0.5 mg total) by mouth at bedtime as needed (Nausea or vomiting). (Patient not taking: Reported on 12/06/2018) 30 tablet 0   montelukast (SINGULAIR) 10 MG tablet Take 1 tablet (10 mg total) by mouth at bedtime. 30 tablet 0   NIFEdipine (PROCARDIA XL/ADALAT-CC) 60 MG 24 hr tablet Take 60 mg by mouth daily.  1   Olopatadine HCl (PAZEO) 0.7 % SOLN Apply 1 drop to eye daily. (Patient not taking: Reported on 12/06/2018) 2.5 mL 5   prochlorperazine (COMPAZINE) 10 MG tablet Take 1 tablet (10 mg total) by mouth every 6 (six) hours as needed (Nausea or vomiting). (Patient not taking: Reported on 12/06/2018) 30 tablet 1   triamcinolone (KENALOG) 0.025 % ointment Apply 1 application topically 2 (two) times daily. (Patient not taking: Reported on 12/06/2018) 30 g 0   No current facility-administered medications for this visit.     OBJECTIVE:   Vitals:   12/11/18 1526  BP: 123/87  Pulse: (!) 108  Resp: 18  Temp: 97.8 F (36.6 C)  SpO2: 99%      Body mass index is 37.3 kg/m.   Wt Readings from Last 3 Encounters:  12/11/18 231 lb 1.6 oz (104.8 kg)  11/30/18 227 lb 6.4 oz (103.1 kg)  11/09/18 225 lb (102.1 kg)  ECOG FS:1 - Symptomatic but completely ambulatory GENERAL: Patient is a well appearing female in no acute distress HEENT:  Sclerae anicteric.  Mask in place. Neck is supple.  NODES:  No cervical, supraclavicular, or axillary lymphadenopathy palpated.  BREAST EXAM:  Deferred. LUNGS:  Clear to auscultation bilaterally.  No wheezes or rhonchi. HEART:  Regular rate and rhythm. No murmur appreciated. ABDOMEN:  Soft, nontender.  Positive, normoactive bowel sounds. No organomegaly palpated. MSK:  No focal spinal tenderness to palpation. Full range of motion bilaterally in the upper extremities. EXTREMITIES:  No peripheral edema.   SKIN:  Rash on chest wall is now hyperpigmented and flat, no excoriation or erythema noted NEURO:  Nonfocal. Well oriented.  Appropriate affect.     LAB RESULTS:  CMP     Component Value Date/Time   NA 141 11/30/2018 0820   K 3.9 11/30/2018 0820   CL 106 11/30/2018 0820   CO2 23 11/30/2018 0820   GLUCOSE 163 (H) 11/30/2018 0820   BUN 25 (H) 11/30/2018 0820   CREATININE 1.06 (H) 11/30/2018 0820   CALCIUM 10.0 11/30/2018 0820   PROT 7.9 11/30/2018 0820   ALBUMIN 4.3 11/30/2018 0820   AST 9 (L) 11/30/2018 0820   ALT 17 11/30/2018 0820   ALKPHOS 94 11/30/2018 0820   BILITOT 0.4 11/30/2018 0820   GFRNONAA >60 11/30/2018 0820   GFRAA >60 11/30/2018 0820    No results found for: TOTALPROTELP, ALBUMINELP, A1GS, A2GS, BETS, BETA2SER, GAMS, MSPIKE, SPEI  No results found for: KPAFRELGTCHN, LAMBDASER, KAPLAMBRATIO  Lab Results  Component Value Date   WBC 22.2 (H) 11/30/2018   NEUTROABS 19.6 (H) 11/30/2018   HGB 11.8 (L) 11/30/2018   HCT 35.4 (L) 11/30/2018   MCV 82.7 11/30/2018   PLT 266 11/30/2018    '@LASTCHEMISTRY'$ @  No results found for: LABCA2  No components found for:  ZJQBHA193  No results for input(s): INR in the last 168 hours.  No results found for: LABCA2  No results found for: XTK240  No results found for: XBD532  No results found for: DJM426  No results found for: CA2729  No components found for: HGQUANT  No results found for: CEA1 / No results found for: CEA1   No results found for: AFPTUMOR  No results found for: CHROMOGRNA  No results found for: PSA1  No visits with results within 3 Day(s) from this visit.  Latest known visit with results is:  Appointment on 11/30/2018  Component Date Value Ref Range Status   Sodium 11/30/2018 141  135 - 145 mmol/L Final   Potassium 11/30/2018 3.9  3.5 - 5.1 mmol/L Final   Chloride 11/30/2018 106  98 - 111 mmol/L Final   CO2 11/30/2018 23  22 - 32 mmol/L Final   Glucose, Bld 11/30/2018 163* 70 - 99 mg/dL Final   BUN 11/30/2018 25* 6 - 20 mg/dL Final   Creatinine 11/30/2018 1.06* 0.44 - 1.00 mg/dL Final   Calcium 11/30/2018 10.0  8.9 - 10.3 mg/dL Final   Total Protein 11/30/2018 7.9  6.5 - 8.1 g/dL Final   Albumin 11/30/2018 4.3  3.5 - 5.0 g/dL Final   AST 11/30/2018 9* 15 - 41 U/L Final   ALT 11/30/2018 17  0 - 44 U/L Final   Alkaline Phosphatase 11/30/2018 94  38 - 126 U/L Final   Total Bilirubin 11/30/2018 0.4  0.3 - 1.2 mg/dL Final   GFR, Est Non Af Am 11/30/2018 >60  >60 mL/min Final   GFR, Est AFR Am 11/30/2018 >60  >60 mL/min Final   Anion gap 11/30/2018 12  5 - 15 Final   Performed at [facility], [place] [address] [place], [place] [address]   WBC Count 11/30/2018 22.2* 4.0 - 10.5 K/uL Final   RBC 11/30/2018 4.28  3.87 - 5.11 MIL/uL Final  Hemoglobin 11/30/2018 11.8* 12.0 - 15.0 g/dL Final   HCT 11/30/2018 35.4* 36.0 - 46.0 % Final   MCV 11/30/2018 82.7  80.0 - 100.0 fL Final   MCH 11/30/2018 27.6  26.0 - 34.0 pg Final   MCHC 11/30/2018 33.3  30.0 - 36.0 g/dL Final   RDW 11/30/2018 16.3* 11.5 - 15.5 % Final   Platelet Count  11/30/2018 266  150 - 400 K/uL Final   nRBC 11/30/2018 0.0  0.0 - 0.2 % Final   Neutrophils Relative % 11/30/2018 89  % Final   Neutro Abs 11/30/2018 19.6* 1.7 - 7.7 K/uL Final   Lymphocytes Relative 11/30/2018 6  % Final   Lymphs Abs 11/30/2018 1.4  0.7 - 4.0 K/uL Final   Monocytes Relative 11/30/2018 3  % Final   Monocytes Absolute 11/30/2018 0.7  0.1 - 1.0 K/uL Final   Eosinophils Relative 11/30/2018 0  % Final   Eosinophils Absolute 11/30/2018 0.0  0.0 - 0.5 K/uL Final   Basophils Relative 11/30/2018 0  % Final   Basophils Absolute 11/30/2018 0.0  0.0 - 0.1 K/uL Final   Immature Granulocytes 11/30/2018 2  % Final   Abs Immature Granulocytes 11/30/2018 0.51* 0.00 - 0.07 K/uL Final   Performed at [facility], [address] [place], [place] [address]    (this displays the last labs from the last 3 days)  No results found for: TOTALPROTELP, ALBUMINELP, A1GS, A2GS, BETS, BETA2SER, GAMS, MSPIKE, SPEI (this displays SPEP labs)  No results found for: KPAFRELGTCHN, LAMBDASER, KAPLAMBRATIO (kappa/lambda light chains)  No results found for: HGBA, HGBA2QUANT, HGBFQUANT, HGBSQUAN (Hemoglobinopathy evaluation)   No results found for: LDH  No results found for: IRON, TIBC, IRONPCTSAT (Iron and TIBC)  Lab Results  Component Value Date   FERRITIN 143 11/09/2018    Urinalysis    Component Value Date/Time   COLORURINE YELLOW 05/15/2007 0957   APPEARANCEUR CLEAR 05/15/2007 0957   LABSPEC 1.010 05/15/2007 0957   PHURINE 8.0 05/15/2007 0957   GLUCOSEU NEGATIVE 05/15/2007 0957   HGBUR NEGATIVE 05/15/2007 0957   BILIRUBINUR NEGATIVE 05/15/2007 0957   KETONESUR NEGATIVE 05/15/2007 0957   PROTEINUR NEGATIVE 05/15/2007 0957   UROBILINOGEN 0.2 05/15/2007 0957   NITRITE NEGATIVE 05/15/2007 0957   LEUKOCYTESUR  05/15/2007 0957    NEGATIVE MICROSCOPIC NOT DONE ON URINES WITH NEGATIVE PROTEIN, BLOOD, LEUKOCYTES, NITRITE, OR GLUCOSE <1000 mg/dL.      STUDIES: No results found.  ELIGIBLE FOR AVAILABLE RESEARCH PROTOCOL: no  ASSESSMENT: 53 y.o.  [place] status post left breast upper outer quadrant biopsy 06/29/2018 for a clinical T2N0 invasive ductal carcinoma, grade 2, estrogen receptor positive, progesterone receptor negative, with an MIB-1-1 of 10%, and no HER-2 amplification.  (1) genetics 07/18/2018 through the [facility] Common Hereditary Cancers Panel found no deleterious mutations in APC, ATM, AXIN2, BARD1, BMPR1A, BRCA1, BRCA2, BRIP1, CDH1, CDKN2A (p14ARF), CDKN2A (p16INK4a), CKD4, CHEK2, CTNNA1, DICER1, EPCAM (Deletion/duplication testing only), GREM1 (promoter region deletion/duplication testing only), KIT, MEN1, MLH1, MSH2, MSH3, MSH6, MUTYH, NBN, NF1, NHTL1, PALB2, PDGFRA, PMS2, POLD1, POLE, PTEN, RAD50, RAD51C, RAD51D, SDHB, SDHC, SDHD, SMAD4, SMARCA4. STK11, TP53, TSC1, TSC2, and VHL.  The following genes were evaluated for sequence changes only: SDHA and HOXB13 c.251G>A variant only.   (2) status post left lumpectomy and sentinel lymph node sampling 08/22/2018 for a pT2 pN0, stage IB invasive ductal carcinoma, grade 2, with negative margins  (a) a total of 10 axillary lymph nodes were removed  (3) Oncotype score of 34  predicts a risk of recurrence outside the breast in the next 9 years of 22% if the patient's only systemic therapy is antiestrogens for 5 years.  It also predicts significant benefit from chemotherapy.  (4) adjuvant chemotherapy consisting of cyclophosphamide and docetaxel given every 21 days x 4 from 09/29/2018 through 11/30/2018  (5) adjuvant radiation to follow  (6) antiestrogens to start at the completion of local treatment.  PLAN:  Ariahna and I spent a long time talking about her symptoms.  They are improving, slowly, and her heart rate on recheck is about 100.  This is better than it was on her treatment day.  We talked about her drinking plenty of fluids, and continuing to giving her body time to  rest and recover.  We discussed the recommendation of her beginning to walk on her treadmill, 5 minutes per day, after Thanksgiving and then slowly adding one minute every 1-3 days as she can tolerate it.  This may also help with her fatigue.  Shakendra is increasingly anxious and is struggling with this.  We talked about our support services, and also discussed the possiblity of starting Venlafaxine for her anxiety.  I counseled her that this would be our medication of choice as when she starts anti estrogens once she completes adjuvant radiation she may experience hot flashes, and these can help with this also.  She has lorazepam to take at night when her mind is racing, however we talked about using this sparingly and trying benadryl as well.  I gave Kaylenn info about Venlafaxine in her AVS and she will call us if she decides she wants to try it.    Linday will now go onto radiation therapy.  We will see her afterwards.  She was recommended to continue with the appropriate pandemic precautions. She knows to call for any questions that may arise between now and her next appointment.  We are happy to see her sooner if needed.   A total of (30) minutes of face-to-face time was spent with this patient with greater than 50% of that time in counseling and care-coordination.   Wilber Bihari, NP Medical Oncology and Hematology [facility] [address] [place], [place] [address] Tel. [phone]    Fax. [phone]

## 2018-12-13 ENCOUNTER — Other Ambulatory Visit: Payer: Self-pay

## 2018-12-13 DIAGNOSIS — J4540 Moderate persistent asthma, uncomplicated: Secondary | ICD-10-CM

## 2018-12-13 MED ORDER — MONTELUKAST SODIUM 10 MG PO TABS: 10.0000 mg | ORAL_TABLET | Freq: Every day | ORAL | 0 refills | Status: DC

## 2018-12-14 ENCOUNTER — Encounter: Payer: Self-pay | Admitting: Adult Health

## 2018-12-18 ENCOUNTER — Other Ambulatory Visit: Payer: Self-pay

## 2018-12-18 DIAGNOSIS — J3089 Other allergic rhinitis: Secondary | ICD-10-CM

## 2018-12-18 MED ORDER — FLUTICASONE PROPIONATE 50 MCG/ACT NA SUSP: NASAL | 0 refills | Status: DC

## 2018-12-20 ENCOUNTER — Ambulatory Visit
Admission: RE | Admit: 2018-12-20 | Discharge: 2018-12-20 | Disposition: A | Discharge status: Home / Self Care | Payer: BC Managed Care – PPO | Source: Ambulatory Visit | Attending: Radiation Oncology | Admitting: Radiation Oncology

## 2018-12-20 ENCOUNTER — Other Ambulatory Visit: Payer: Self-pay

## 2018-12-20 DIAGNOSIS — Z170 Estrogen receptor positive status [ER+]: Secondary | ICD-10-CM | POA: Diagnosis not present

## 2018-12-20 DIAGNOSIS — Z510 Encounter for antineoplastic radiation therapy: Secondary | ICD-10-CM | POA: Diagnosis present

## 2018-12-20 DIAGNOSIS — C50412 Malignant neoplasm of upper-outer quadrant of left female breast: Secondary | ICD-10-CM | POA: Diagnosis present

## 2018-12-22 DIAGNOSIS — C50412 Malignant neoplasm of upper-outer quadrant of left female breast: Secondary | ICD-10-CM | POA: Diagnosis not present

## 2018-12-22 NOTE — Progress Notes (Signed)
  Radiation Oncology         [phone]) [phone] ________________________________  Name: Veronica Reilly MRN: [id number]  Date: 12/20/2018  DOB: [birth year]   DIAGNOSIS:     ICD-10-CM   1. Malignant neoplasm of upper-outer quadrant of left breast in female, estrogen receptor positive ([facility])  C50.412    Z17.0     SIMULATION AND TREATMENT PLANNING NOTE  The patient presented for simulation prior to beginning her course of radiation treatment for her diagnosis of left-sided breast cancer. The patient was placed in a supine position on a breast board. A customized vac-lock bag was constructed and this complex treatment device will be used on a daily basis during her treatment. In this fashion, a CT scan was obtained through the chest area and an isocenter was placed near the chest wall within the breast.  The patient will be planned to receive a course of radiation initially to a dose of 42.56 Gy. This will consist of a whole breast radiotherapy technique. To accomplish this, 2 customized blocks have been designed which will correspond to medial and lateral whole breast tangent fields. This treatment will be accomplished at 2.66 Gy per fraction. A forward planning technique will also be evaluated to determine if this approach improves the plan. It is anticipated that the patient will then receive a 8 Gy boost to the seroma cavity which has been contoured. This will be accomplished at 2 Gy per fraction.   This initial treatment will consist of a 3-D conformal technique. The seroma has been contoured as the primary target structure. Additionally, dose volume histograms of both this target as well as the lungs and heart will also be evaluated. Such an approach is necessary to ensure that the target area is adequately covered while the nearby critical  normal structures are adequately spared.  Plan:  The final anticipated total dose therefore will correspond to 50.56 Gy.   Special treatment procedure was  performed today due to the extra time and effort required by myself to plan and prepare this patient for deep inspiration breath hold technique.  I have determined cardiac sparing to be of benefit to this patient to prevent long term cardiac damage due to radiation of the heart.  Bellows were placed on the patient's abdomen. To facilitate cardiac sparing, the patient was coached by the radiation therapists on breath hold techniques and breathing practice was performed. Practice waveforms were obtained. The patient was then scanned while maintaining breath hold in the treatment position.  This image was then transferred over to the imaging specialist. The imaging specialist then created a fusion of the free breathing and breath hold scans using the chest wall as the stable structure. I personally reviewed the fusion in axial, coronal and sagittal image planes.  Excellent cardiac sparing was obtained.  I felt the patient is an appropriate candidate for breath hold and the patient will be treated as such.  The image fusion was then reviewed with the patient to reinforce the necessity of reproducible breath hold.     _______________________________   Jodelle Gross, MD, PhD

## 2018-12-22 NOTE — Progress Notes (Signed)
  Radiation Oncology         [id number]) [phone] ________________________________  Name: Veronica Reilly MRN: [id number]  Date: 12/20/2018  DOB: 19-Oct-1965  Optical Surface Tracking Plan:  Since intensity modulated radiotherapy (IMRT) and 3D conformal radiation treatment methods are predicated on accurate and precise positioning for treatment, intrafraction motion monitoring is medically necessary to ensure accurate and safe treatment delivery.  The ability to quantify intrafraction motion without excessive ionizing radiation dose can only be performed with optical surface tracking. Accordingly, surface imaging offers the opportunity to obtain 3D measurements of patient position throughout IMRT and 3D treatments without excessive radiation exposure.  I am ordering optical surface tracking for this patient's upcoming course of radiotherapy. ________________________________  Kyung Rudd, MD 12/22/2018 7:42 AM    Reference:   Particia Jasper, et al. Surface imaging-based analysis of intrafraction motion for breast radiotherapy patients.Journal of [place], n. 6, nov. 2014. ISSN [id number].   Available at: <http://www.jacmp.org/index.php/jacmp/article/view/4957>.

## 2018-12-25 ENCOUNTER — Ambulatory Visit
Admission: RE | Admit: 2018-12-25 | Discharge: 2018-12-25 | Disposition: A | Discharge status: Home / Self Care | Payer: BC Managed Care – PPO | Source: Ambulatory Visit | Attending: Radiation Oncology | Admitting: Radiation Oncology

## 2018-12-25 ENCOUNTER — Other Ambulatory Visit: Payer: Self-pay

## 2018-12-25 DIAGNOSIS — C50412 Malignant neoplasm of upper-outer quadrant of left female breast: Secondary | ICD-10-CM | POA: Diagnosis not present

## 2018-12-26 ENCOUNTER — Encounter: Payer: Self-pay | Admitting: *Deleted

## 2018-12-26 ENCOUNTER — Other Ambulatory Visit: Payer: Self-pay

## 2018-12-26 ENCOUNTER — Ambulatory Visit
Admission: RE | Admit: 2018-12-26 | Discharge: 2018-12-26 | Disposition: A | Discharge status: Home / Self Care | Payer: BC Managed Care – PPO | Source: Ambulatory Visit | Attending: Radiation Oncology | Admitting: Radiation Oncology

## 2018-12-26 DIAGNOSIS — C50412 Malignant neoplasm of upper-outer quadrant of left female breast: Secondary | ICD-10-CM | POA: Diagnosis not present

## 2018-12-27 ENCOUNTER — Ambulatory Visit
Admission: RE | Admit: 2018-12-27 | Discharge: 2018-12-27 | Disposition: A | Discharge status: Home / Self Care | Payer: BC Managed Care – PPO | Source: Ambulatory Visit | Attending: Radiation Oncology | Admitting: Radiation Oncology

## 2018-12-27 ENCOUNTER — Other Ambulatory Visit: Payer: Self-pay

## 2018-12-27 DIAGNOSIS — C50412 Malignant neoplasm of upper-outer quadrant of left female breast: Secondary | ICD-10-CM | POA: Diagnosis not present

## 2018-12-28 ENCOUNTER — Other Ambulatory Visit: Payer: Self-pay

## 2018-12-28 ENCOUNTER — Encounter: Payer: Self-pay | Admitting: Adult Health

## 2018-12-28 ENCOUNTER — Telehealth: Payer: Self-pay

## 2018-12-28 ENCOUNTER — Ambulatory Visit
Admission: RE | Admit: 2018-12-28 | Discharge: 2018-12-28 | Disposition: A | Discharge status: Home / Self Care | Payer: BC Managed Care – PPO | Source: Ambulatory Visit | Attending: Radiation Oncology | Admitting: Radiation Oncology

## 2018-12-28 DIAGNOSIS — C50412 Malignant neoplasm of upper-outer quadrant of left female breast: Secondary | ICD-10-CM | POA: Diagnosis not present

## 2018-12-28 NOTE — Telephone Encounter (Signed)
Spoke with patient about new issues with lumpectomy site.  Patient stated that she will see Dr. Lisbeth Renshaw tomorrow and he will assess.

## 2018-12-29 ENCOUNTER — Ambulatory Visit
Admission: RE | Admit: 2018-12-29 | Discharge: 2018-12-29 | Disposition: A | Discharge status: Home / Self Care | Payer: BC Managed Care – PPO | Source: Ambulatory Visit | Attending: Radiation Oncology | Admitting: Radiation Oncology

## 2018-12-29 ENCOUNTER — Other Ambulatory Visit: Payer: Self-pay

## 2018-12-29 DIAGNOSIS — C50412 Malignant neoplasm of upper-outer quadrant of left female breast: Secondary | ICD-10-CM

## 2018-12-29 DIAGNOSIS — Z170 Estrogen receptor positive status [ER+]: Secondary | ICD-10-CM

## 2018-12-29 MED ORDER — SONAFINE EX EMUL
1.0000 "application " | Freq: Two times a day (BID) | CUTANEOUS | Status: DC
  Administered 2018-12-29: 1 via TOPICAL

## 2018-12-29 MED ORDER — ALRA NON-METALLIC DEODORANT (RAD-ONC)
1.0000 "application " | Freq: Once | TOPICAL | Status: AC
  Administered 2018-12-29: 1 via TOPICAL

## 2019-01-01 ENCOUNTER — Telehealth: Payer: Self-pay | Admitting: Radiation Oncology

## 2019-01-01 ENCOUNTER — Encounter: Payer: Self-pay | Admitting: Adult Health

## 2019-01-01 ENCOUNTER — Other Ambulatory Visit: Payer: Self-pay

## 2019-01-01 ENCOUNTER — Ambulatory Visit
Admission: RE | Admit: 2019-01-01 | Discharge: 2019-01-01 | Disposition: A | Discharge status: Home / Self Care | Payer: BC Managed Care – PPO | Source: Ambulatory Visit | Attending: Radiation Oncology | Admitting: Radiation Oncology

## 2019-01-01 DIAGNOSIS — C50412 Malignant neoplasm of upper-outer quadrant of left female breast: Secondary | ICD-10-CM | POA: Diagnosis not present

## 2019-01-01 NOTE — Telephone Encounter (Signed)
Received voicemail message from patient explaining she awoke with an elevated heart rate of 100 that rises to 130 with ambulation. Phoned patient back. No answer. Left voicemail message explaining her elevated heart rate is unrelated to radiation therapy and encouraged her to follow up with her PCP. Provided my direct number for further questions or needs.

## 2019-01-01 NOTE — Telephone Encounter (Signed)
This RN spoke with pt per her mychart message stating ongoing elevated heart rate. Veronica Reilly states she feels she has not been hydrating well due to returning to the office last week for work.  Due to noted heart rate- she has aggressively hydrated herself with water, gatorade and pedilyte with heartrate now between 89 and 110. Note prior to hydrating her pulse was 139.  [name] wanted [name] to know above as well as she will continuing to hydrate.  This RN validated pt's information as well as discussed heart rate issues.  No further needs at this time- pt understands to call the office if needed.

## 2019-01-02 ENCOUNTER — Ambulatory Visit
Admission: RE | Admit: 2019-01-02 | Discharge: 2019-01-02 | Disposition: A | Discharge status: Home / Self Care | Payer: BC Managed Care – PPO | Source: Ambulatory Visit | Attending: Radiation Oncology | Admitting: Radiation Oncology

## 2019-01-02 ENCOUNTER — Other Ambulatory Visit: Payer: Self-pay

## 2019-01-02 DIAGNOSIS — C50412 Malignant neoplasm of upper-outer quadrant of left female breast: Secondary | ICD-10-CM | POA: Diagnosis not present

## 2019-01-03 ENCOUNTER — Other Ambulatory Visit: Payer: Self-pay

## 2019-01-03 ENCOUNTER — Ambulatory Visit
Admission: RE | Admit: 2019-01-03 | Discharge: 2019-01-03 | Disposition: A | Discharge status: Home / Self Care | Payer: BC Managed Care – PPO | Source: Ambulatory Visit | Attending: Radiation Oncology | Admitting: Radiation Oncology

## 2019-01-03 DIAGNOSIS — C50412 Malignant neoplasm of upper-outer quadrant of left female breast: Secondary | ICD-10-CM | POA: Diagnosis not present

## 2019-01-04 ENCOUNTER — Other Ambulatory Visit: Payer: Self-pay

## 2019-01-04 ENCOUNTER — Ambulatory Visit
Admission: RE | Admit: 2019-01-04 | Discharge: 2019-01-04 | Disposition: A | Discharge status: Home / Self Care | Payer: BC Managed Care – PPO | Source: Ambulatory Visit | Attending: Radiation Oncology | Admitting: Radiation Oncology

## 2019-01-04 DIAGNOSIS — C50412 Malignant neoplasm of upper-outer quadrant of left female breast: Secondary | ICD-10-CM | POA: Diagnosis not present

## 2019-01-05 ENCOUNTER — Ambulatory Visit
Admission: RE | Admit: 2019-01-05 | Discharge: 2019-01-05 | Disposition: A | Discharge status: Home / Self Care | Payer: BC Managed Care – PPO | Source: Ambulatory Visit | Attending: Radiation Oncology | Admitting: Radiation Oncology

## 2019-01-05 ENCOUNTER — Other Ambulatory Visit: Payer: Self-pay

## 2019-01-05 DIAGNOSIS — C50412 Malignant neoplasm of upper-outer quadrant of left female breast: Secondary | ICD-10-CM | POA: Diagnosis not present

## 2019-01-08 ENCOUNTER — Ambulatory Visit
Admission: RE | Admit: 2019-01-08 | Discharge: 2019-01-08 | Disposition: A | Discharge status: Home / Self Care | Payer: BC Managed Care – PPO | Source: Ambulatory Visit | Attending: Radiation Oncology | Admitting: Radiation Oncology

## 2019-01-08 ENCOUNTER — Other Ambulatory Visit: Payer: Self-pay

## 2019-01-08 DIAGNOSIS — C50412 Malignant neoplasm of upper-outer quadrant of left female breast: Secondary | ICD-10-CM | POA: Diagnosis not present

## 2019-01-09 ENCOUNTER — Other Ambulatory Visit: Payer: Self-pay

## 2019-01-09 ENCOUNTER — Other Ambulatory Visit: Payer: Self-pay | Admitting: *Deleted

## 2019-01-09 ENCOUNTER — Ambulatory Visit
Admission: RE | Admit: 2019-01-09 | Discharge: 2019-01-09 | Disposition: A | Discharge status: Home / Self Care | Payer: BC Managed Care – PPO | Source: Ambulatory Visit | Attending: Radiation Oncology | Admitting: Radiation Oncology

## 2019-01-09 DIAGNOSIS — H1013 Acute atopic conjunctivitis, bilateral: Secondary | ICD-10-CM

## 2019-01-09 DIAGNOSIS — C50412 Malignant neoplasm of upper-outer quadrant of left female breast: Secondary | ICD-10-CM | POA: Diagnosis not present

## 2019-01-09 MED ORDER — PAZEO 0.7 % OP SOLN: 1.0000 [drp] | Freq: Every day | OPHTHALMIC | 0 refills | Status: DC

## 2019-01-10 ENCOUNTER — Ambulatory Visit
Admission: RE | Admit: 2019-01-10 | Discharge: 2019-01-10 | Disposition: A | Discharge status: Home / Self Care | Payer: BC Managed Care – PPO | Source: Ambulatory Visit | Attending: Radiation Oncology | Admitting: Radiation Oncology

## 2019-01-10 ENCOUNTER — Other Ambulatory Visit: Payer: Self-pay

## 2019-01-10 DIAGNOSIS — C50412 Malignant neoplasm of upper-outer quadrant of left female breast: Secondary | ICD-10-CM | POA: Diagnosis not present

## 2019-01-11 ENCOUNTER — Other Ambulatory Visit: Payer: Self-pay

## 2019-01-11 ENCOUNTER — Ambulatory Visit
Admission: RE | Admit: 2019-01-11 | Discharge: 2019-01-11 | Disposition: A | Discharge status: Home / Self Care | Payer: BC Managed Care – PPO | Source: Ambulatory Visit | Attending: Radiation Oncology | Admitting: Radiation Oncology

## 2019-01-11 DIAGNOSIS — C50412 Malignant neoplasm of upper-outer quadrant of left female breast: Secondary | ICD-10-CM | POA: Diagnosis not present

## 2019-01-15 ENCOUNTER — Ambulatory Visit
Admission: RE | Admit: 2019-01-15 | Discharge: 2019-01-15 | Disposition: A | Discharge status: Home / Self Care | Payer: BC Managed Care – PPO | Source: Ambulatory Visit | Attending: Radiation Oncology | Admitting: Radiation Oncology

## 2019-01-15 ENCOUNTER — Other Ambulatory Visit: Payer: Self-pay

## 2019-01-15 DIAGNOSIS — C50412 Malignant neoplasm of upper-outer quadrant of left female breast: Secondary | ICD-10-CM | POA: Diagnosis not present

## 2019-01-16 ENCOUNTER — Other Ambulatory Visit: Payer: Self-pay

## 2019-01-16 ENCOUNTER — Ambulatory Visit
Admission: RE | Admit: 2019-01-16 | Discharge: 2019-01-16 | Disposition: A | Discharge status: Home / Self Care | Payer: BC Managed Care – PPO | Source: Ambulatory Visit | Attending: Radiation Oncology | Admitting: Radiation Oncology

## 2019-01-16 DIAGNOSIS — C50412 Malignant neoplasm of upper-outer quadrant of left female breast: Secondary | ICD-10-CM | POA: Diagnosis not present

## 2019-01-17 ENCOUNTER — Ambulatory Visit
Admission: RE | Admit: 2019-01-17 | Discharge: 2019-01-17 | Disposition: A | Discharge status: Home / Self Care | Payer: BC Managed Care – PPO | Source: Ambulatory Visit | Attending: Radiation Oncology | Admitting: Radiation Oncology

## 2019-01-17 ENCOUNTER — Other Ambulatory Visit: Payer: Self-pay

## 2019-01-17 DIAGNOSIS — C50412 Malignant neoplasm of upper-outer quadrant of left female breast: Secondary | ICD-10-CM | POA: Diagnosis not present

## 2019-01-18 ENCOUNTER — Other Ambulatory Visit: Payer: Self-pay

## 2019-01-18 ENCOUNTER — Encounter: Payer: Self-pay | Admitting: *Deleted

## 2019-01-18 ENCOUNTER — Ambulatory Visit
Admission: RE | Admit: 2019-01-18 | Discharge: 2019-01-18 | Disposition: A | Discharge status: Home / Self Care | Payer: BC Managed Care – PPO | Source: Ambulatory Visit | Attending: Radiation Oncology | Admitting: Radiation Oncology

## 2019-01-18 DIAGNOSIS — C50412 Malignant neoplasm of upper-outer quadrant of left female breast: Secondary | ICD-10-CM | POA: Diagnosis not present

## 2019-01-21 ENCOUNTER — Encounter: Payer: Self-pay | Admitting: *Deleted

## 2019-01-22 ENCOUNTER — Other Ambulatory Visit: Payer: Self-pay

## 2019-01-22 ENCOUNTER — Encounter: Payer: Self-pay | Admitting: *Deleted

## 2019-01-22 ENCOUNTER — Ambulatory Visit
Admission: RE | Admit: 2019-01-22 | Discharge: 2019-01-22 | Disposition: A | Discharge status: Home / Self Care | Payer: BC Managed Care – PPO | Source: Ambulatory Visit | Attending: Radiation Oncology | Admitting: Radiation Oncology

## 2019-01-22 DIAGNOSIS — C50412 Malignant neoplasm of upper-outer quadrant of left female breast: Secondary | ICD-10-CM | POA: Insufficient documentation

## 2019-01-22 DIAGNOSIS — Z170 Estrogen receptor positive status [ER+]: Secondary | ICD-10-CM | POA: Insufficient documentation

## 2019-01-22 DIAGNOSIS — Z510 Encounter for antineoplastic radiation therapy: Secondary | ICD-10-CM | POA: Insufficient documentation

## 2019-01-23 ENCOUNTER — Ambulatory Visit
Admission: RE | Admit: 2019-01-23 | Discharge: 2019-01-23 | Disposition: A | Discharge status: Home / Self Care | Payer: BC Managed Care – PPO | Source: Ambulatory Visit | Attending: Radiation Oncology | Admitting: Radiation Oncology

## 2019-01-23 ENCOUNTER — Other Ambulatory Visit: Payer: Self-pay

## 2019-01-23 DIAGNOSIS — C50412 Malignant neoplasm of upper-outer quadrant of left female breast: Secondary | ICD-10-CM | POA: Diagnosis not present

## 2019-01-24 ENCOUNTER — Other Ambulatory Visit: Payer: Self-pay

## 2019-01-24 ENCOUNTER — Encounter: Payer: Self-pay | Admitting: *Deleted

## 2019-01-24 ENCOUNTER — Encounter: Payer: Self-pay | Admitting: Radiation Oncology

## 2019-01-24 ENCOUNTER — Ambulatory Visit
Admission: RE | Admit: 2019-01-24 | Discharge: 2019-01-24 | Disposition: A | Discharge status: Home / Self Care | Payer: BC Managed Care – PPO | Source: Ambulatory Visit | Attending: Radiation Oncology | Admitting: Radiation Oncology

## 2019-01-24 DIAGNOSIS — C50412 Malignant neoplasm of upper-outer quadrant of left female breast: Secondary | ICD-10-CM | POA: Diagnosis not present

## 2019-01-24 NOTE — Progress Notes (Signed)
[facility]  [phone] [phone]     ID: Veronica Reilly DOB: [birth year]  MR#: [id number]  JYN#:[id number]  Patient Care Team: Leeroy Cha, MD as PCP - General (Internal Medicine) Karra Pink, Virgie Dad, MD as Consulting Physician (Oncology) Kyung Rudd, MD as Consulting Physician (Radiation Oncology) Erroll Luna, MD as Consulting Physician (General Surgery) Delsa Bern, MD as Consulting Physician (Obstetrics and Gynecology) Kennith Gain, MD as Consulting Physician (Allergy) Delrae Rend, MD as Consulting Physician (Endocrinology) Rockwell Germany, RN as Oncology Nurse Navigator Mauro Kaufmann, RN as Oncology Nurse Navigator Chauncey Cruel, MD OTHER MD:  CHIEF COMPLAINT: estrogen receptor positive breast cancer  CURRENT TREATMENT: adjuvant radiation pending.     INTERVAL HISTORY: Veronica Reilly returns today for follow up of her estrogen receptor positive breast cancer.  She started adjuvant radiation therapy on 12/26/2018, and she completed treatments yesterday, 01/24/2019.  She generally tolerated these well except she had significant desquamation.  She is still using the creams.  She tells me she has a good range of motion in the upper extremities.  She is still somewhat fatigued.  She is here today to discuss antiestrogens.  REVIEW OF SYSTEMS: Veronica Reilly tells me she is ready to have her port removed.  She took appropriate pandemic precautions over the holidays and felt well enough to do some cooking with her family.  She is not exercising regularly.  A detailed review of systems today was otherwise noncontributory     HISTORY OF CURRENT ILLNESS: From the original intake note:  Veronica Reilly had some calcifications in the left breast noted March 2019 on screening mammography.. She underwent biopsy of the left retroareolar area of concern on 03/18/2017 with pathology ([id number]) showing fibrocystic changes with no  malignancy. Return in 6 months for left diagnostic mammography and ultrasonography was recommended, and she reports this took place in November 2019 at her gynecologist's office and the changes previously noted were stable.  She underwent bilateral diagnostic mammography with tomography and left breast ultrasonography at [facility] on 06/27/2018 showing: breast density category C; left breast upper-outer quadrant 3.1 cm group of indeterminate calcifications; persistent benign-appearing cyst in the subareolar left breast, post core needle biopsy changes.  On physical exam there were no palpable masses.  Targeted ultrasound revealed no suspicious masses.  There was a left subareolar breast cyst measuring 1.0 cm.  Left axilla was unremarkable.  Accordingly on 06/29/2018 she proceeded to biopsy of the left breast calcifications area in question. The pathology from this procedure ([id number]) showed: invasive ductal carcinoma, grade 2, with ductal carcinoma in situ. Prognostic indicators significant for: estrogen receptor, 100% positive with strong staining intensity and progesterone receptor, 0% negative. Proliferation marker Ki67 at 10%. HER2 negtive by immunohistochemistry, (0).  The patient's subsequent history is as detailed below.   PAST MEDICAL HISTORY: Past Medical History:  Diagnosis Date  . ASCUS (atypical squamous cells of undetermined significance) on Pap smear   . Asthma    controlled with daily inhaler  . Breast cancer ([facility]) 06/29/2018   Left IDC  . Dysplasia of cervix, low grade (CIN 1)   . Eczema   . Family history of breast cancer   . Family history of lung cancer   . Family history of prostate cancer   . GERD (gastroesophageal reflux disease)   . Heart murmur   . History of chicken pox   . Hypertension   . Hypothyroid   . Uterine fibroid   Heart murmur,  evaluated by cardiologist   PAST SURGICAL HISTORY: Past Surgical History:  Procedure Laterality Date  .  ADENOIDECTOMY    . BREAST LUMPECTOMY WITH RADIOACTIVE SEED AND SENTINEL LYMPH NODE BIOPSY Left 08/22/2018   Procedure: LEFT BREAST LUMPECTOMY WITH RADIOACTIVE SEED AND SENTINEL LYMPH NODE MAPPING;  Surgeon: Erroll Luna, MD;  Location: [facility];  Service: General;  Laterality: Left;  . LEEP    . MYOMECTOMY    . PORTACATH PLACEMENT Right 09/27/2018   Procedure: INSERTION PORT-A-CATH WITH ULTRASOUND;  Surgeon: Erroll Luna, MD;  Location: [facility];  Service: General;  Laterality: Right;  . TONSILECTOMY, ADENOIDECTOMY, BILATERAL MYRINGOTOMY AND TUBES    . TONSILLECTOMY      FAMILY HISTORY: Family History  Problem Relation Age of Onset  . Breast cancer Maternal Grandmother   . Heart disease Father   . Diabetes Father   . Prostate cancer Father 82  . Breast cancer Mother 72  . Hypertension Other   . Lung cancer Maternal Aunt   . Allergic rhinitis Neg Hx   . Angioedema Neg Hx   . Asthma Neg Hx   . Eczema Neg Hx   . Immunodeficiency Neg Hx   . Urticaria Neg Hx    Patient's father was 32 years old when he died from diabetes and renal failure. Patient's mother died from breast cancer at age 15. The patient notes a family hx of breast and possibly ovarian cancer. Her mother was diagnosed with inflammatory breast cancer at around age 95 (she was a patient here), and the patient maternal grandmother was also diagnosed with breast cancer and died at age 19.  The patient has 1 brother, no sisters. She also had one maternal aunt with lung cancer and a history of smoking, her father with prostate cancer at age 14, and a possible GYN cancer in a paternal aunt.   GYNECOLOGIC HISTORY:  Last menstrual period February 2020  menarche: 54 years old GX P 0, she has had fertility treatments LMP 05/2018, lasted 3 days, irregular (s/p ablation) Contraceptive: used for apprx. 6 months HRT never used  Hysterectomy? no BSO? 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 She is married. Husband Veronica Reilly is a trauma nurse at [employer]. She lives at home with her husband and some hermit crabs and salt and fresh water fish. 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    ADVANCED DIRECTIVES: Husband Veronica Reilly is her HCPOA.   HEALTH MAINTENANCE: Social History   Tobacco Use  . Smoking status: Never Smoker  . Smokeless tobacco: Never Used  Substance Use Topics  . Alcohol use: No  . Drug use: No     Colonoscopy: Pending  PAP: Up-to-date  Bone density: never done   Allergies  Allergen Reactions  . Apple Anaphylaxis  . Iohexol     Contrast dye  . Other Shortness Of Breath    CHERRIES, TREES, MOLD,DUST, COCKROACHES  . Povidone-Iodine Anaphylaxis  . Shellfish Allergy Anaphylaxis  . Peach [Prunus Persica] Itching and Hives    mouth  . Zocor [Simvastatin] Other (See Comments)    Alopecia  . Betadine [Povidone Iodine]     Due to shellfish allergy  . Latex Rash    Current Outpatient Medications  Medication Sig Dispense Refill  . albuterol (VENTOLIN HFA) 108 (90 Base) MCG/ACT inhaler Use 2 puffs every four hours  as needed for cough or wheeze.  May use 2 puffs 10-20 minutes prior to exercise. 1 Inhaler 1  . budesonide-formoterol (SYMBICORT) 160-4.5 MCG/ACT inhaler INHALE 2 PUFFS INTO THE LUNGS 2 (TWO) TIMES DAILY. (Patient taking differently: Inhale 2 puffs into the lungs daily. ) 10.2 g 5  . Cholecalciferol (VITAMIN D3) 50 MCG (2000 UT) TABS Take 2,000 Units by mouth every evening.    . clotrimazole-betamethasone (LOTRISONE) cream APPLY EXTERNALLY TO THE AFFECTED AREA TWICE DAILY 60 g 0  . dexamethasone (DECADRON) 4 MG tablet Take 2 tablets (8 mg total) by mouth 2 (two) times daily. Start the day before Taxotere. Then again the day after chemo for 3 days. (Patient not taking: Reported on 12/06/2018) 30 tablet 1  . EPINEPHrine (EPIPEN 2-PAK)  0.3 mg/0.3 mL IJ SOAJ injection Use as directed for a threatening allergic reaction (Patient taking differently: Inject 0.3 mg into the muscle as needed for anaphylaxis. Use as directed for a threatening allergic reaction) 4 Device 1  . fluconazole (DIFLUCAN) 200 MG tablet Take 1 tablet (200 mg total) by mouth daily. 60 tablet 1  . fluticasone (FLONASE) 50 MCG/ACT nasal spray USE 2 SPRAYS IN EACH NOSTRIL ONCE DAILY FOR STUFFY NOSE OR DRAINAGE 16 g 0  . folic acid (FOLVITE) 1 MG tablet Take 1 mg by mouth every evening.     . hydrochlorothiazide (MICROZIDE) 12.5 MG capsule Take 12.5 mg by mouth daily.   1  . levocetirizine (XYZAL) 5 MG tablet Take 5 mg by mouth daily.    Marland Kitchen levothyroxine (SYNTHROID, LEVOTHROID) 112 MCG tablet Take 112 mcg by mouth daily before breakfast.     . lidocaine-prilocaine (EMLA) cream Apply to affected area once 30 g 3  . LORazepam (ATIVAN) 0.5 MG tablet Take 1 tablet (0.5 mg total) by mouth at bedtime as needed (Nausea or vomiting). (Patient not taking: Reported on 12/06/2018) 30 tablet 0  . montelukast (SINGULAIR) 10 MG tablet Take 1 tablet (10 mg total) by mouth at bedtime. 30 tablet 0  . NIFEdipine (PROCARDIA XL/ADALAT-CC) 60 MG 24 hr tablet Take 60 mg by mouth daily.  1  . Olopatadine HCl (PAZEO) 0.7 % SOLN Apply 1 drop to eye daily. 2.5 mL 0  . prochlorperazine (COMPAZINE) 10 MG tablet Take 1 tablet (10 mg total) by mouth every 6 (six) hours as needed (Nausea or vomiting). (Patient not taking: Reported on 12/06/2018) 30 tablet 1  . tamoxifen (NOLVADEX) 20 MG tablet Take 1 tablet (20 mg total) by mouth daily. 90 tablet 12  . triamcinolone (KENALOG) 0.025 % ointment Apply 1 application topically 2 (two) times daily. (Patient not taking: Reported on 12/06/2018) 30 g 0   No current facility-administered medications for this visit.    OBJECTIVE: Middle-aged African-American woman in no acute distress  Vitals:   01/25/19 1103  BP: 134/84  Pulse: 91  Resp: 19  Temp:  98.4 F (36.9 C)  SpO2: 99%     Body mass index is 36.22 kg/m.   Wt Readings from Last 3 Encounters:  01/25/19 224 lb 6.4 oz (101.8 kg)  12/11/18 231 lb 1.6 oz (104.8 kg)  11/30/18 227 lb 6.4 oz (103.1 kg)  ECOG FS:1 - Symptomatic but completely ambulatory  Hair is coming in uniformly with no bald spots Sclerae unicteric, EOMs intact Wearing a mask No cervical or supraclavicular adenopathy Lungs no rales or rhonchi Heart regular rate and rhythm Abd soft, nontender, positive bowel sounds MSK no focal spinal tenderness, no upper extremity lymphedema Neuro:  nonfocal, well oriented, appropriate affect Breasts: Right breast is benign per the left breast is status post lumpectomy followed by radiation.  There is significant dry desquamation at this point, as well as hyperpigmentation.  The overall cosmetic result however is good.  There is no evidence of residual recurrent disease.  Both axillae are benign.   LAB RESULTS:  CMP     Component Value Date/Time   NA 141 11/30/2018 0820   K 3.9 11/30/2018 0820   CL 106 11/30/2018 0820   CO2 23 11/30/2018 0820   GLUCOSE 163 (H) 11/30/2018 0820   BUN 25 (H) 11/30/2018 0820   CREATININE 1.06 (H) 11/30/2018 0820   CALCIUM 10.0 11/30/2018 0820   PROT 7.9 11/30/2018 0820   ALBUMIN 4.3 11/30/2018 0820   AST 9 (L) 11/30/2018 0820   ALT 17 11/30/2018 0820   ALKPHOS 94 11/30/2018 0820   BILITOT 0.4 11/30/2018 0820   GFRNONAA >60 11/30/2018 0820   GFRAA >60 11/30/2018 0820    No results found for: TOTALPROTELP, ALBUMINELP, A1GS, A2GS, BETS, BETA2SER, GAMS, MSPIKE, SPEI  No results found for: KPAFRELGTCHN, LAMBDASER, KAPLAMBRATIO  Lab Results  Component Value Date   WBC 22.2 (H) 11/30/2018   NEUTROABS 19.6 (H) 11/30/2018   HGB 11.8 (L) 11/30/2018   HCT 35.4 (L) 11/30/2018   MCV 82.7 11/30/2018   PLT 266 11/30/2018   No results found for: LABCA2  No components found for: VZCHYI502  No results for input(s): INR in the last 168  hours.  No results found for: LABCA2  No results found for: DXA128  No results found for: NOM767  No results found for: MCN470  No results found for: CA2729  No components found for: HGQUANT  No results found for: CEA1 / No results found for: CEA1   No results found for: AFPTUMOR  No results found for: CHROMOGRNA  No results found for: HGBA, HGBA2QUANT, HGBFQUANT, HGBSQUAN (Hemoglobinopathy evaluation)   No results found for: LDH  No results found for: IRON, TIBC, IRONPCTSAT (Iron and TIBC)  Lab Results  Component Value Date   FERRITIN 143 11/09/2018    Urinalysis    Component Value Date/Time   COLORURINE YELLOW 05/15/2007 0957   APPEARANCEUR CLEAR 05/15/2007 0957   LABSPEC 1.010 05/15/2007 0957   PHURINE 8.0 05/15/2007 0957   GLUCOSEU NEGATIVE 05/15/2007 0957   HGBUR NEGATIVE 05/15/2007 0957   BILIRUBINUR NEGATIVE 05/15/2007 0957   KETONESUR NEGATIVE 05/15/2007 0957   PROTEINUR NEGATIVE 05/15/2007 0957   UROBILINOGEN 0.2 05/15/2007 0957   NITRITE NEGATIVE 05/15/2007 0957   LEUKOCYTESUR  05/15/2007 0957    NEGATIVE MICROSCOPIC NOT DONE ON URINES WITH NEGATIVE PROTEIN, BLOOD, LEUKOCYTES, NITRITE, OR GLUCOSE <1000 mg/dL.    STUDIES: No results found.   ELIGIBLE FOR AVAILABLE RESEARCH PROTOCOL: no  ASSESSMENT: 54 y.o.  [place] status post left breast upper outer quadrant biopsy 06/29/2018 for a clinical T2N0 invasive ductal carcinoma, grade 2, estrogen receptor positive, progesterone receptor negative, with an MIB-1-1 of 10%, and no HER-2 amplification.  (1) genetics 07/18/2018 through the [facility] Common Hereditary Cancers Panel found no deleterious mutations in APC, ATM, AXIN2, BARD1, BMPR1A, BRCA1, BRCA2, BRIP1, CDH1, CDKN2A (p14ARF), CDKN2A (p16INK4a), CKD4, CHEK2, CTNNA1, DICER1, EPCAM (Deletion/duplication testing only), GREM1 (promoter region deletion/duplication testing only), KIT, MEN1, MLH1, MSH2, MSH3, MSH6, MUTYH, NBN, NF1, NHTL1, PALB2,  PDGFRA, PMS2, POLD1, POLE, PTEN, RAD50, RAD51C, RAD51D, SDHB, SDHC, SDHD, SMAD4, SMARCA4. STK11, TP53, TSC1, TSC2, and VHL.  The following genes were evaluated for sequence changes  only: SDHA and HOXB13 c.251G>A variant only.   (2) status post left lumpectomy and sentinel lymph node sampling 08/22/2018 for a pT2 pN0, stage IB invasive ductal carcinoma, grade 2, with negative margins  (a) a total of 10 axillary lymph nodes were removed  (3) Oncotype score of 34 predicts a risk of recurrence outside the breast in the next 9 years of 22% if the patient's only systemic therapy is antiestrogens for 5 years.  It also predicts significant benefit from chemotherapy.  (4) adjuvant chemotherapy consisting of cyclophosphamide and docetaxel given every 21 days x 4 from 09/29/2018 through 11/30/2018  (5) adjuvant radiation completed 01/23/2018  (6) tamoxifen started 01/24/2018   PLAN: Mariela is done with her chemotherapy and radiation as well of course as her surgery.  She is now ready to start antiestrogens.  We discussed the difference between tamoxifen and anastrozole in detail. She understands that anastrozole and the aromatase inhibitors in general work by blocking estrogen production. Accordingly vaginal dryness, decrease in bone density, and of course hot flashes can result. The aromatase inhibitors can also negatively affect the cholesterol profile, although that is a minor effect. One out of 5 women on aromatase inhibitors we will feel "old and achy". This arthralgia/myalgia syndrome, which resembles fibromyalgia clinically, does resolve with stopping the medications. Accordingly this is not a reason to not try an aromatase inhibitor but it is a frequent reason to stop it (in other words 20% of women will not be able to tolerate these medications).  Tamoxifen on the other hand does not block estrogen production. It does not "take away a woman's estrogen". It blocks the estrogen receptor in breast cells.  Like anastrozole, it can also cause hot flashes. As opposed to anastrozole, tamoxifen has many estrogen-like effects. It is technically an estrogen receptor modulator. This means that in some tissues tamoxifen works like estrogen-- for example it helps strengthen the bones. It tends to improve the cholesterol profile. It can cause thickening of the endometrial lining, and even endometrial polyps or rarely cancer of the uterus.(The risk of uterine cancer due to tamoxifen is one additional cancer per thousand women year). It can cause vaginal wetness or stickiness. It can cause blood clots through this estrogen-like effect--the risk of blood clots with tamoxifen is exactly the same as with birth control pills or hormone replacement.  Neither of these agents causes mood changes or weight gain, despite the popular belief that they can have these side effects. We have data from studies comparing either of these drugs with placebo, and in those cases the control group had the same amount of weight gain and depression as the group that took the drug.  I think she would be a good candidate for tamoxifen and I went ahead and wrote her another prescription.  She however will start February 1, after she recovers a little bit more from radiation.  She will then return and see me mid April and if she is tolerating tamoxifen well the plan will be to continue that a total of 5 years  I let her surgeon know she is ready to have her port removed at his or her discretion.  She knows to call for any other issue that may develop before the next visit here.  Total time this encounter 25 minutes   Sarajane Jews C. Ina Poupard, MD Medical Oncology and Hematology [facility] [place], [place] [address] Tel. [phone]    Fax. [phone]   I, Wilburn Mylar,  am acting as scribe for Dr. Sarajane Jews C. Shina Wass.  I, Lurline Del MD, have reviewed the above documentation for accuracy and  completeness, and I agree with the above.

## 2019-01-25 ENCOUNTER — Inpatient Hospital Stay: Payer: BC Managed Care – PPO | Attending: Oncology

## 2019-01-25 ENCOUNTER — Inpatient Hospital Stay: Payer: BC Managed Care – PPO | Admitting: Oncology

## 2019-01-25 ENCOUNTER — Telehealth: Payer: Self-pay | Admitting: Radiation Oncology

## 2019-01-25 ENCOUNTER — Other Ambulatory Visit: Payer: Self-pay

## 2019-01-25 VITALS — BP 134/84 | HR 91 | Temp 98.4°F | Resp 19 | Ht 66.0 in | Wt 224.4 lb

## 2019-01-25 DIAGNOSIS — Z79810 Long term (current) use of selective estrogen receptor modulators (SERMs): Secondary | ICD-10-CM | POA: Diagnosis not present

## 2019-01-25 DIAGNOSIS — R5383 Other fatigue: Secondary | ICD-10-CM | POA: Diagnosis not present

## 2019-01-25 DIAGNOSIS — Z170 Estrogen receptor positive status [ER+]: Secondary | ICD-10-CM

## 2019-01-25 DIAGNOSIS — Z833 Family history of diabetes mellitus: Secondary | ICD-10-CM | POA: Insufficient documentation

## 2019-01-25 DIAGNOSIS — Z79899 Other long term (current) drug therapy: Secondary | ICD-10-CM | POA: Diagnosis not present

## 2019-01-25 DIAGNOSIS — Z923 Personal history of irradiation: Secondary | ICD-10-CM | POA: Diagnosis not present

## 2019-01-25 DIAGNOSIS — F329 Major depressive disorder, single episode, unspecified: Secondary | ICD-10-CM | POA: Insufficient documentation

## 2019-01-25 DIAGNOSIS — Z7952 Long term (current) use of systemic steroids: Secondary | ICD-10-CM | POA: Insufficient documentation

## 2019-01-25 DIAGNOSIS — Z8249 Family history of ischemic heart disease and other diseases of the circulatory system: Secondary | ICD-10-CM | POA: Diagnosis not present

## 2019-01-25 DIAGNOSIS — J45909 Unspecified asthma, uncomplicated: Secondary | ICD-10-CM | POA: Insufficient documentation

## 2019-01-25 DIAGNOSIS — Z803 Family history of malignant neoplasm of breast: Secondary | ICD-10-CM | POA: Insufficient documentation

## 2019-01-25 DIAGNOSIS — Z8042 Family history of malignant neoplasm of prostate: Secondary | ICD-10-CM | POA: Diagnosis not present

## 2019-01-25 DIAGNOSIS — Z7951 Long term (current) use of inhaled steroids: Secondary | ICD-10-CM | POA: Insufficient documentation

## 2019-01-25 DIAGNOSIS — E039 Hypothyroidism, unspecified: Secondary | ICD-10-CM | POA: Diagnosis not present

## 2019-01-25 DIAGNOSIS — C50412 Malignant neoplasm of upper-outer quadrant of left female breast: Secondary | ICD-10-CM

## 2019-01-25 DIAGNOSIS — Z801 Family history of malignant neoplasm of trachea, bronchus and lung: Secondary | ICD-10-CM | POA: Diagnosis not present

## 2019-01-25 DIAGNOSIS — I10 Essential (primary) hypertension: Secondary | ICD-10-CM | POA: Insufficient documentation

## 2019-01-25 MED ORDER — TAMOXIFEN CITRATE 20 MG PO TABS: 20.0000 mg | ORAL_TABLET | Freq: Every day | ORAL | 12 refills | Status: AC

## 2019-01-25 NOTE — Telephone Encounter (Signed)
  Radiation Oncology         [phone]) [phone] ________________________________  Name: Veronica Reilly MRN: [id number]  Date of Service: 01/25/2019  DOB: 1965/12/03  Post Treatment Telephone Note  Diagnosis:   Stage IIA, pT2N0M0, grade 2 ER positive invasive ductal carcinoma of the left breast.  Interval Since Last Radiation:  1 day  12/26/2018-01/24/2019: The left breast was treated to 42.56 Gy in 16 fractions followed by an 8 Gy boost to the surgical cavity over 4 fractions.   Narrative:  The patient was contacted today for routine follow-up. During treatment she did very well with radiotherapy but did have some dry desquamation.  Impression/Plan: 1. 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 I contacted the patient to follow up with her regarding her concerns about her skin, but had to leave a message asking her to call us back.     Carola Rhine, PAC

## 2019-01-26 ENCOUNTER — Telehealth: Payer: Self-pay | Admitting: Oncology

## 2019-01-26 NOTE — Telephone Encounter (Signed)
I talk with patient regarding schedule  

## 2019-02-02 ENCOUNTER — Encounter: Payer: Self-pay | Admitting: Allergy

## 2019-02-02 ENCOUNTER — Other Ambulatory Visit: Payer: Self-pay

## 2019-02-02 ENCOUNTER — Ambulatory Visit: Payer: BC Managed Care – PPO | Admitting: Allergy

## 2019-02-02 VITALS — BP 118/72 | HR 72 | Temp 97.9°F | Resp 18 | Ht 65.5 in | Wt 222.0 lb

## 2019-02-02 DIAGNOSIS — H1013 Acute atopic conjunctivitis, bilateral: Secondary | ICD-10-CM

## 2019-02-02 DIAGNOSIS — J3089 Other allergic rhinitis: Secondary | ICD-10-CM | POA: Diagnosis not present

## 2019-02-02 DIAGNOSIS — J4540 Moderate persistent asthma, uncomplicated: Secondary | ICD-10-CM

## 2019-02-02 MED ORDER — LEVOCETIRIZINE DIHYDROCHLORIDE 5 MG PO TABS: 5.0000 mg | ORAL_TABLET | Freq: Every day | ORAL | 5 refills | Status: DC

## 2019-02-02 MED ORDER — ALBUTEROL SULFATE HFA 108 (90 BASE) MCG/ACT IN AERS: INHALATION_SPRAY | RESPIRATORY_TRACT | 1 refills | Status: DC

## 2019-02-02 MED ORDER — MONTELUKAST SODIUM 10 MG PO TABS: 10.0000 mg | ORAL_TABLET | Freq: Every day | ORAL | 5 refills | Status: DC

## 2019-02-02 MED ORDER — BUDESONIDE-FORMOTEROL FUMARATE 160-4.5 MCG/ACT IN AERO: INHALATION_SPRAY | RESPIRATORY_TRACT | 5 refills | Status: DC

## 2019-02-02 MED ORDER — PAZEO 0.7 % OP SOLN: 1.0000 [drp] | Freq: Every day | OPHTHALMIC | 5 refills | Status: DC

## 2019-02-02 MED ORDER — EPINEPHRINE 0.3 MG/0.3ML IJ SOAJ: 0.3000 mg | INTRAMUSCULAR | 1 refills | Status: DC | PRN

## 2019-02-02 NOTE — Progress Notes (Signed)
Follow-up Note  RE: Veronica Reilly MRN: [id number] DOB: Jul 10, 1965 Date of Office Visit: 02/02/2019   History of present illness: Veronica Reilly is a 54 y.o. female presenting today for follow-up of asthma and allergic rhinitis with conjunctivitis.  She was last seen in the office on May 29, 2018 by Dr. Maudie Mercury for an asthma exacerbation.  She was treated at that time with prednisone.  Since her last visit she was diagnosed with breast cancer in July 2020.  She had a lumpectomy in August 2020.  She finished 4 rounds of chemo in November 2020 and her last radiation last week.  She states during the chemo she was receiving dexamethasone.  She believes her port will be removed in the next upcoming weeks.  She states during this time she did have issues with fatigue and feeling winded if she needed to go up steps or walk a prolonged period of time.  This will resolve with resting.  Since she is no longer on chemotherapy she states that she has not had any worsening of her asthma symptoms.  She is currently not taking any Symbicort at this time.  She is however taking her Singulair as well as Xyzal daily.  She does not feel that her Flonase is effective anymore at controlling nasal congestion.  She will use Pazeo as needed for any ocular itch or drainage. She states she plans to get the Covid vaccine when it is available for her.   Review of systems: Review of Systems  Constitutional: Negative.   HENT: Positive for congestion.   Eyes: Negative.   Respiratory: Negative.   Cardiovascular: Negative.   Gastrointestinal: Negative.   Musculoskeletal: Negative.   Skin: Negative.   Neurological: Negative.     All other systems negative unless noted above in HPI  Past medical/social/surgical/family history have been reviewed and are unchanged unless specifically indicated below.  See HPI  Medication List: Current Outpatient Medications  Medication Sig Dispense Refill  . albuterol (VENTOLIN  HFA) 108 (90 Base) MCG/ACT inhaler Use 2 puffs every four hours as needed for cough or wheeze.  May use 2 puffs 10-20 minutes prior to exercise. 18 g 1  . budesonide-formoterol (SYMBICORT) 160-4.5 MCG/ACT inhaler INHALE 2 PUFFS INTO THE LUNGS 2 (TWO) TIMES DAILY. 10.2 g 5  . Cholecalciferol (VITAMIN D3) 50 MCG (2000 UT) TABS Take 2,000 Units by mouth every evening.    Marland Kitchen EPINEPHrine (EPIPEN 2-PAK) 0.3 mg/0.3 mL IJ SOAJ injection Inject 0.3 mLs (0.3 mg total) into the muscle as needed for anaphylaxis. Use as directed for a threatening allergic reaction 2 each 1  . esomeprazole (NEXIUM 24HR) 20 MG capsule Take 20 mg by mouth daily at 12 noon.    . fluticasone (FLONASE) 50 MCG/ACT nasal spray USE 2 SPRAYS IN EACH NOSTRIL ONCE DAILY FOR STUFFY NOSE OR DRAINAGE 16 g 0  . folic acid (FOLVITE) 1 MG tablet Take 1 mg by mouth every evening.     . hydrochlorothiazide (MICROZIDE) 12.5 MG capsule Take 12.5 mg by mouth daily.   1  . levocetirizine (XYZAL) 5 MG tablet Take 1 tablet (5 mg total) by mouth daily. 30 tablet 5  . levothyroxine (SYNTHROID, LEVOTHROID) 112 MCG tablet Take 112 mcg by mouth daily before breakfast.     . montelukast (SINGULAIR) 10 MG tablet Take 1 tablet (10 mg total) by mouth at bedtime. 30 tablet 5  . NIFEdipine (PROCARDIA XL/ADALAT-CC) 60 MG 24 hr tablet Take 60 mg by mouth  daily.  1  . Olopatadine HCl (PAZEO) 0.7 % SOLN Apply 1 drop to eye daily. 2.5 mL 5  . tamoxifen (NOLVADEX) 20 MG tablet Take 1 tablet (20 mg total) by mouth daily. 90 tablet 12   No current facility-administered medications for this visit.     Known medication allergies: Allergies  Allergen Reactions  . Apple Anaphylaxis  . Iohexol     Contrast dye  . Other Shortness Of Breath    CHERRIES, TREES, MOLD,DUST, COCKROACHES  . Povidone-Iodine Anaphylaxis  . Shellfish Allergy Anaphylaxis  . Peach [Prunus Persica] Itching and Hives    mouth  . Zocor [Simvastatin] Other (See Comments)    Alopecia  . Betadine  [Povidone Iodine]     Due to shellfish allergy  . Latex Rash     Physical examination: Blood pressure 118/72, pulse 72, temperature 97.9 F (36.6 C), temperature source Temporal, resp. rate 18, height 5' 5.5" (1.664 m), weight 222 lb (100.7 kg), SpO2 99 %.  General: Alert, interactive, in no acute distress. HEENT: PERRLA, TMs pearly gray, turbinates mildly edematous without discharge, post-pharynx non erythematous. Neck: Supple without lymphadenopathy. Lungs: Clear to auscultation without wheezing, rhonchi or rales. {no increased work of breathing. CV: Normal S1, S2 without murmurs. Abdomen: Nondistended, nontender. Skin: Warm and dry, without lesions or rashes. Extremities:  No clubbing, cyanosis or edema. Neuro:   Grossly intact.  Diagnositics/Labs:  Spirometry: FEV1: 2.05L 82%, FVC: 2.59L 83%, ratio consistent with Nonobstructive pattern  Assessment and plan:   Moderate persistent asthma Under good control . Asthma action plan (initiate if flared or having respiratory tract illness): Symbicort 160- 2 puffs twice a day.   Rinse mouth afterwards. . Prior to physical activity: May use albuterol rescue inhaler 2 puffs 5 to 15 minutes prior to strenuous physical activities. Marland Kitchen Rescue medications: May use albuterol rescue inhaler 2 puffs or nebulizer every 4 to 6 hours as needed for shortness of breath, chest tightness, coughing, and wheezing. Monitor frequency of use.  . Asthma control goals:  o Full participation in all desired activities (may need albuterol before activity) o Albuterol use two times or less a week on average (not counting use with activity) o Cough interfering with sleep two times or less a month o Oral steroids no more than once a year o No hospitalizations   Allergic rhinitis with conjunctivitis - Continue appropriate avoidance measures for dust mites, cat, grasses, trees, and weeds. - Saline nasal rinse daily as needed. - use Nasacort 2 sprays each  nostril as needed for congestion.   This replaces Flonase. - Continue Xyzal (levocetirizine) daily as needed. - Continue Singulair 10mg  daily - Continue Pazeo 1 drop each eye as needed for itchy/watery/red eyes.  This can now be found over-the-counter  Discussed receiving Covid vaccine once available to you.    Follow up in 6 months or sooner if needed  I appreciate the opportunity to take part in [facility] care. Please do not hesitate to contact me with questions.  Sincerely,   Prudy Feeler, MD Allergy/Immunology [facility]

## 2019-02-02 NOTE — Patient Instructions (Addendum)
Moderate persistent asthma Under good control . Asthma action plan (initiate if flared or having respiratory tract illness): Symbicort 160- 2 puffs twice a day.   Rinse mouth afterwards. . Prior to physical activity: May use albuterol rescue inhaler 2 puffs 5 to 15 minutes prior to strenuous physical activities. Marland Kitchen Rescue medications: May use albuterol rescue inhaler 2 puffs or nebulizer every 4 to 6 hours as needed for shortness of breath, chest tightness, coughing, and wheezing. Monitor frequency of use.  . Asthma control goals:  o Full participation in all desired activities (may need albuterol before activity) o Albuterol use two times or less a week on average (not counting use with activity) o Cough interfering with sleep two times or less a month o Oral steroids no more than once a year o No hospitalizations   Allergic rhinoconjunctivitis - Continue appropriate avoidance measures for dust mites, cat, grasses, trees, and weeds. - Saline nasal rinse daily as needed. - use Nasacort 2 sprays each nostril as needed for congestion.   This replaces Flonase. - Continue Xyzal (levocetirizine) daily as needed. - Continue Singulair 10mg  daily - Continue Pazeo 1 drop each eye as needed for itchy/watery/red eyes.  This can now be found over-the-counter  Discussed receiving Covid vaccine once available to you.    Follow up in 6 months or sooner if needed

## 2019-02-07 NOTE — Telephone Encounter (Signed)
No entry 

## 2019-02-08 ENCOUNTER — Other Ambulatory Visit: Payer: Self-pay

## 2019-02-08 DIAGNOSIS — J4540 Moderate persistent asthma, uncomplicated: Secondary | ICD-10-CM

## 2019-02-08 MED ORDER — MONTELUKAST SODIUM 10 MG PO TABS: 10.0000 mg | ORAL_TABLET | Freq: Every day | ORAL | 5 refills | Status: DC

## 2019-02-14 ENCOUNTER — Telehealth: Payer: Self-pay | Admitting: Radiation Oncology

## 2019-02-14 NOTE — Telephone Encounter (Signed)
I called the patient and she is doing much better since radiation ended. We reviewed skin considerations and recommendations to avoid sun exposure. She will follow up with Dr. Jana Hakim and let us know if she has any further questions or concerns.

## 2019-02-24 ENCOUNTER — Encounter: Payer: Self-pay | Admitting: Adult Health

## 2019-02-26 ENCOUNTER — Encounter: Payer: Self-pay | Admitting: Adult Health

## 2019-03-05 ENCOUNTER — Ambulatory Visit: Payer: Self-pay | Admitting: Surgery

## 2019-03-05 NOTE — H&P (Signed)
[name] Documented: 03/05/2019 2:52 PM Location: [facility] Patient #: [id number] DOB: 02/27/1965 Married / Language: English / Race: Black or African American Female  History of Present Illness Marcello Moores A. Shaquna Geigle MD; 03/05/2019 3:47 PM) Patient words: 54 y.o. [place] status post left breast upper outer quadrant biopsy 06/29/2018 for a clinical T2N0 invasive ductal carcinoma, grade 2, estrogen receptor positive, progesterone receptor negative, with an MIB-1-1 of 10%, and no HER-2 amplification.  The patient has completed chemotherapy. She is unwell. She is here today to discuss port removal.  The patient is a 54 year old female.   Problem List/Past Medical Sharyn Lull R. Brooks, CMA; 03/05/2019 2:54 PM) BREAST CANCER, LEFT (C50.912) POST-OPERATIVE STATE [phone])  Past Surgical History Sharyn Lull R. Brooks, CMA; 03/05/2019 2:54 PM) Breast Biopsy Left. multiple Foot Surgery Left. Oral Surgery  Diagnostic Studies History Sharyn Lull R. Rolena Infante, CMA; 03/05/2019 2:54 PM) Colonoscopy never Mammogram within last year Pap Smear 1-5 years ago  Allergies Sharyn Lull R. Rolena Infante, CMA; 03/05/2019 2:54 PM) Zocor *ANTIHYPERLIPIDEMICS*  Medication History Sharyn Lull R. Brooks, CMA; 03/05/2019 2:54 PM) Tamoxifen Citrate (20MG Tablet, Oral) Active. hydroCHLOROthiazide (12.5MG Capsule, Oral) Active. Folic Acid (1MG Tablet, Oral) Active. Flonase (50MCG/DOSE Inhaler, Nasal) Active. Levothyroxine Sodium (112MCG Tablet, Oral) Active. Symbicort (160-4.5MCG/ACT Aerosol, Inhalation) Active. Singulair (10MG Tablet, Oral) Active. NIFEdipine ER Osmotic Release (60MG Tablet ER 24HR, Oral) Active. Vitamin D (Oral) Specific strength unknown - Active. Medications Reconciled  Social History Sharyn Lull R. Brooks, CMA; 03/05/2019 2:54 PM) Caffeine use Carbonated beverages, Coffee. No alcohol use No drug use Tobacco use Never smoker.  Family History Sharyn Lull R. Rolena Infante,  CMA; 03/05/2019 2:54 PM) Alcohol Abuse Brother. Breast Cancer Family Members In General, Mother. Cancer Family Members In General. Diabetes Mellitus Brother, Family Members In General, Father, Mother. Heart Disease Brother, Family Members In General, Father, Mother. Hypertension Brother, Family Members In [place], Father, Mother. Kidney Disease Father. Prostate Cancer Father. Respiratory Condition Mother. Thyroid problems Mother.  Pregnancy / Birth History Sharyn Lull R. Rolena Infante, CMA; 03/05/2019 2:54 PM) Age at menarche 65 years. Age of menopause 50-55 Gravida 0 Irregular periods Para 0  Other Problems Sharyn Lull R. Brooks, CMA; 03/05/2019 2:54 PM) Asthma Breast Cancer Gastroesophageal Reflux Disease Heart murmur High blood pressure Thyroid Disease    Vitals (Michelle R. Brooks CMA; 03/05/2019 2:52 PM) 03/05/2019 2:52 PM Weight: 223.25 lb Height: 66in Body Surface Area: 2.1 m Body Mass Index: 36.03 kg/m  Pulse: 103 (Regular)  BP: 118/70 (Sitting, Left Arm, Standard)        Physical Exam (Dewight Catino A. Sweden Lesure MD; 03/05/2019 3:08 PM)  General Mental Status-Alert. General Appearance-Consistent with stated age. Hydration-Well hydrated. Voice-Normal.  Breast Note: left breast post op changes right sided port in place  Neurologic Neurologic evaluation reveals -alert and oriented x 3 with no impairment of recent or remote memory. Mental Status-Normal.  Musculoskeletal Normal Exam - Left-Upper Extremity Strength Normal and Lower Extremity Strength Normal. Normal Exam - Right-Upper Extremity Strength Normal and Lower Extremity Strength Normal.    Assessment & Plan (Holdyn Poyser A. Tanayia Wahlquist MD; 03/05/2019 3:49 PM)  BREAST CANCER, LEFT (C50.912) Impression: Patient has completed chemotherapy. She would like to have her port removed and we will schedule her for that. TOTAL TIME 20 MINUTES  Current Plans I recommended surgery to  remove the catheter. I explained the technique of removal with use of local anesthesia & possible need for more aggressive sedation/anesthesia for patient comfort.  Risks such as bleeding, infection, and other risks were discussed. Post-operative dressing/incision care was discussed.  I noted a good likelihood this will help address the problem. We will work to minimize complications. Questions were answered. The patient expresses understanding & wishes to proceed with surgery.

## 2019-03-05 NOTE — H&P (View-Only) (Signed)
Veronica Reilly Documented: 03/05/2019 2:52 PM Location: [facility] Patient #: [id number] DOB: 02/27/1965 Married / Language: English / Race: Black or African American Female  History of Present Illness Veronica Moores A. Cortez Flippen MD; 03/05/2019 3:47 PM) Patient words: 54 y.o. [place] status post left breast upper outer quadrant biopsy 06/29/2018 for a clinical T2N0 invasive ductal carcinoma, grade 2, estrogen receptor positive, progesterone receptor negative, with an MIB-1-1 of 10%, and no HER-2 amplification.  The patient has completed chemotherapy. She is unwell. She is here today to discuss port removal.  The patient is a 54 year old female.   Problem List/Past Medical Veronica Reilly, CMA; 03/05/2019 2:54 PM) BREAST CANCER, LEFT (C50.912) POST-OPERATIVE STATE [phone])  Past Surgical History Veronica Reilly, CMA; 03/05/2019 2:54 PM) Breast Biopsy Left. multiple Foot Surgery Left. Oral Surgery  Diagnostic Studies History Veronica Reilly, CMA; 03/05/2019 2:54 PM) Colonoscopy never Mammogram within last year Pap Smear 1-5 years ago  Allergies Veronica Reilly, CMA; 03/05/2019 2:54 PM) Zocor *ANTIHYPERLIPIDEMICS*  Medication History Veronica Reilly, CMA; 03/05/2019 2:54 PM) Tamoxifen Citrate (20MG Tablet, Oral) Active. hydroCHLOROthiazide (12.5MG Capsule, Oral) Active. Folic Acid (1MG Tablet, Oral) Active. Flonase (50MCG/DOSE Inhaler, Nasal) Active. Levothyroxine Sodium (112MCG Tablet, Oral) Active. Symbicort (160-4.5MCG/ACT Aerosol, Inhalation) Active. Singulair (10MG Tablet, Oral) Active. NIFEdipine ER Osmotic Release (60MG Tablet ER 24HR, Oral) Active. Vitamin D (Oral) Specific strength unknown - Active. Medications Reconciled  Social History Veronica Reilly, CMA; 03/05/2019 2:54 PM) Caffeine use Carbonated beverages, Coffee. No alcohol use No drug use Tobacco use Never smoker.  Family History Veronica Reilly,  CMA; 03/05/2019 2:54 PM) Alcohol Abuse Brother. Breast Cancer Family Members In General, Mother. Cancer Family Members In General. Diabetes Mellitus Brother, Family Members In General, Father, Mother. Heart Disease Brother, Family Members In General, Father, Mother. Hypertension Brother, Family Members In [place], Father, Mother. Kidney Disease Father. Prostate Cancer Father. Respiratory Condition Mother. Thyroid problems Mother.  Pregnancy / Birth History Veronica Reilly, CMA; 03/05/2019 2:54 PM) Age at menarche 65 years. Age of menopause 50-55 Gravida 0 Irregular periods Para 0  Other Problems Veronica Reilly, CMA; 03/05/2019 2:54 PM) Asthma Breast Cancer Gastroesophageal Reflux Disease Heart murmur High blood pressure Thyroid Disease    Vitals (Veronica Reilly CMA; 03/05/2019 2:52 PM) 03/05/2019 2:52 PM Weight: 223.25 lb Height: 66in Body Surface Area: 2.1 m Body Mass Index: 36.03 kg/m  Pulse: 103 (Regular)  BP: 118/70 (Sitting, Left Arm, Standard)        Physical Exam (Veronica Tetro A. Swan Zayed MD; 03/05/2019 3:08 PM)  General Mental Status-Alert. General Appearance-Consistent with stated age. Hydration-Well hydrated. Voice-Normal.  Breast Note: left breast post op changes right sided port in place  Neurologic Neurologic evaluation reveals -alert and oriented x 3 with no impairment of recent or remote memory. Mental Status-Normal.  Musculoskeletal Normal Exam - Left-Upper Extremity Strength Normal and Lower Extremity Strength Normal. Normal Exam - Right-Upper Extremity Strength Normal and Lower Extremity Strength Normal.    Assessment & Plan (Veronica Humiston A. Princes Finger MD; 03/05/2019 3:49 PM)  BREAST CANCER, LEFT (C50.912) Impression: Patient has completed chemotherapy. She would like to have her port removed and we will schedule her for that. TOTAL TIME 20 MINUTES  Current Plans I recommended surgery to  remove the catheter. I explained the technique of removal with use of local anesthesia & possible need for more aggressive sedation/anesthesia for patient comfort.  Risks such as bleeding, infection, and other risks were discussed. Post-operative dressing/incision care was discussed.  I noted a good likelihood this will help address the problem. We will work to minimize complications. Questions were answered. The patient expresses understanding & wishes to proceed with surgery.

## 2019-03-06 ENCOUNTER — Other Ambulatory Visit: Payer: Self-pay | Admitting: Oncology

## 2019-03-06 DIAGNOSIS — C50412 Malignant neoplasm of upper-outer quadrant of left female breast: Secondary | ICD-10-CM

## 2019-03-06 DIAGNOSIS — Z170 Estrogen receptor positive status [ER+]: Secondary | ICD-10-CM

## 2019-03-06 NOTE — Progress Notes (Signed)
  Radiation Oncology         [phone] ________________________________  Name: Veronica Reilly MRN: [id number]  Date: 01/24/2019  DOB: [birth year]  End of Treatment Note  Diagnosis:   left-sided breast cancer     Indication for treatment:  Curative       Radiation treatment dates:   12/26/18 - 01/24/19  Site/dose:   The patient initially received a dose of 42.56 Gy in 16 fractions to the breast using whole-breast tangent fields. This was delivered using a 3-D conformal technique. The patient then received a boost to the seroma. This delivered an additional 8 Gy in 36fractions using a 3 field photon technique due to the depth of the seroma. The total dose was 50.56 Gy.  Narrative: The patient tolerated radiation treatment relatively well.   The patient had some expected skin irritation as she progressed during treatment. Moist desquamation was not present at the end of treatment.  Plan: The patient has completed radiation treatment. The patient will return to radiation oncology clinic for routine followup in one month. I advised the patient to call or return sooner if they have any questions or concerns related to their recovery or treatment. ________________________________  Jodelle Gross, M.D., Ph.D.

## 2019-03-07 ENCOUNTER — Encounter (HOSPITAL_BASED_OUTPATIENT_CLINIC_OR_DEPARTMENT_OTHER): Payer: Self-pay | Admitting: Surgery

## 2019-03-07 ENCOUNTER — Other Ambulatory Visit: Payer: Self-pay

## 2019-03-09 ENCOUNTER — Encounter (HOSPITAL_BASED_OUTPATIENT_CLINIC_OR_DEPARTMENT_OTHER)
Admission: RE | Admit: 2019-03-09 | Discharge: 2019-03-09 | Disposition: A | Discharge status: Home / Self Care | Payer: BC Managed Care – PPO | Source: Ambulatory Visit | Attending: Surgery | Admitting: Surgery

## 2019-03-09 ENCOUNTER — Encounter: Payer: Self-pay | Admitting: Adult Health

## 2019-03-09 ENCOUNTER — Other Ambulatory Visit: Payer: Self-pay

## 2019-03-09 ENCOUNTER — Other Ambulatory Visit (HOSPITAL_COMMUNITY)
Admission: RE | Admit: 2019-03-09 | Discharge: 2019-03-09 | Disposition: A | Discharge status: Home / Self Care | Payer: BC Managed Care – PPO | Source: Ambulatory Visit | Attending: Surgery | Admitting: Surgery

## 2019-03-09 DIAGNOSIS — Z20822 Contact with and (suspected) exposure to covid-19: Secondary | ICD-10-CM | POA: Diagnosis not present

## 2019-03-09 DIAGNOSIS — Z01812 Encounter for preprocedural laboratory examination: Secondary | ICD-10-CM | POA: Diagnosis not present

## 2019-03-09 LAB — BASIC METABOLIC PANEL
Anion gap: 12 (ref 5–15)
BUN: 12 mg/dL (ref 6–20)
CO2: 25 mmol/L (ref 22–32)
Calcium: 9.2 mg/dL (ref 8.9–10.3)
Chloride: 104 mmol/L (ref 98–111)
Creatinine, Ser: 0.86 mg/dL (ref 0.44–1.00)
GFR calc Af Amer: >60 mL/min (ref >60)
GFR calc non Af Amer: >60 mL/min (ref >60)
Glucose, Bld: 90 mg/dL (ref 70–99)
Potassium: 3.2 mmol/L — ABNORMAL LOW (ref 3.5–5.1)
Sodium: 141 mmol/L (ref 135–145)

## 2019-03-09 LAB — SARS CORONAVIRUS 2 (TAT 6-24 HRS): SARS Coronavirus 2: NEGATIVE

## 2019-03-09 NOTE — Progress Notes (Signed)

## 2019-03-12 ENCOUNTER — Encounter: Payer: Self-pay | Admitting: Adult Health

## 2019-03-12 NOTE — Progress Notes (Signed)
K+ 3.2 will proceed with surgery as scheduled per Dr. Deatra Canter.

## 2019-03-13 ENCOUNTER — Ambulatory Visit (HOSPITAL_BASED_OUTPATIENT_CLINIC_OR_DEPARTMENT_OTHER)
Admission: RE | Admit: 2019-03-13 | Discharge: 2019-03-13 | Disposition: A | Discharge status: Home / Self Care | Payer: BC Managed Care – PPO | Attending: Surgery | Admitting: Surgery

## 2019-03-13 ENCOUNTER — Other Ambulatory Visit: Payer: Self-pay

## 2019-03-13 ENCOUNTER — Ambulatory Visit (HOSPITAL_BASED_OUTPATIENT_CLINIC_OR_DEPARTMENT_OTHER): Payer: BC Managed Care – PPO | Admitting: Anesthesiology

## 2019-03-13 ENCOUNTER — Encounter (HOSPITAL_BASED_OUTPATIENT_CLINIC_OR_DEPARTMENT_OTHER)
Admission: RE | Disposition: A | Discharge status: Home / Self Care | Payer: Self-pay | Source: Home / Self Care | Attending: Surgery

## 2019-03-13 ENCOUNTER — Encounter (HOSPITAL_BASED_OUTPATIENT_CLINIC_OR_DEPARTMENT_OTHER): Payer: Self-pay | Admitting: Surgery

## 2019-03-13 DIAGNOSIS — Z8042 Family history of malignant neoplasm of prostate: Secondary | ICD-10-CM | POA: Insufficient documentation

## 2019-03-13 DIAGNOSIS — R011 Cardiac murmur, unspecified: Secondary | ICD-10-CM | POA: Diagnosis not present

## 2019-03-13 DIAGNOSIS — Z841 Family history of disorders of kidney and ureter: Secondary | ICD-10-CM | POA: Diagnosis not present

## 2019-03-13 DIAGNOSIS — Z9221 Personal history of antineoplastic chemotherapy: Secondary | ICD-10-CM | POA: Diagnosis not present

## 2019-03-13 DIAGNOSIS — Z833 Family history of diabetes mellitus: Secondary | ICD-10-CM | POA: Insufficient documentation

## 2019-03-13 DIAGNOSIS — Z79899 Other long term (current) drug therapy: Secondary | ICD-10-CM | POA: Diagnosis not present

## 2019-03-13 DIAGNOSIS — Z8249 Family history of ischemic heart disease and other diseases of the circulatory system: Secondary | ICD-10-CM | POA: Diagnosis not present

## 2019-03-13 DIAGNOSIS — K219 Gastro-esophageal reflux disease without esophagitis: Secondary | ICD-10-CM | POA: Diagnosis not present

## 2019-03-13 DIAGNOSIS — Z836 Family history of other diseases of the respiratory system: Secondary | ICD-10-CM | POA: Insufficient documentation

## 2019-03-13 DIAGNOSIS — E039 Hypothyroidism, unspecified: Secondary | ICD-10-CM | POA: Diagnosis not present

## 2019-03-13 DIAGNOSIS — J45909 Unspecified asthma, uncomplicated: Secondary | ICD-10-CM | POA: Insufficient documentation

## 2019-03-13 DIAGNOSIS — Z452 Encounter for adjustment and management of vascular access device: Secondary | ICD-10-CM | POA: Insufficient documentation

## 2019-03-13 DIAGNOSIS — Z803 Family history of malignant neoplasm of breast: Secondary | ICD-10-CM | POA: Diagnosis not present

## 2019-03-13 DIAGNOSIS — Z888 Allergy status to other drugs, medicaments and biological substances status: Secondary | ICD-10-CM | POA: Insufficient documentation

## 2019-03-13 DIAGNOSIS — Z8349 Family history of other endocrine, nutritional and metabolic diseases: Secondary | ICD-10-CM | POA: Diagnosis not present

## 2019-03-13 DIAGNOSIS — I10 Essential (primary) hypertension: Secondary | ICD-10-CM | POA: Diagnosis not present

## 2019-03-13 DIAGNOSIS — Z853 Personal history of malignant neoplasm of breast: Secondary | ICD-10-CM | POA: Insufficient documentation

## 2019-03-13 DIAGNOSIS — Z811 Family history of alcohol abuse and dependence: Secondary | ICD-10-CM | POA: Diagnosis not present

## 2019-03-13 HISTORY — PX: PORT-A-CATH REMOVAL: SHX5289

## 2019-03-13 LAB — POCT PREGNANCY, URINE: Preg Test, Ur: NEGATIVE

## 2019-03-13 SURGERY — REMOVAL PORT-A-CATH
Anesthesia: Monitor Anesthesia Care | Site: Chest

## 2019-03-13 MED ORDER — CEFAZOLIN SODIUM-DEXTROSE 2-4 GM/100ML-% IV SOLN
INTRAVENOUS | Status: AC
  Filled 2019-03-13: qty 100

## 2019-03-13 MED ORDER — BUPIVACAINE HCL (PF) 0.25 % IJ SOLN
INTRAMUSCULAR | Status: DC | PRN
  Administered 2019-03-13: 9 mL

## 2019-03-13 MED ORDER — PROPOFOL 10 MG/ML IV BOLUS
INTRAVENOUS | Status: AC
  Filled 2019-03-13: qty 20

## 2019-03-13 MED ORDER — CHLORHEXIDINE GLUCONATE CLOTH 2 % EX PADS: 6.0000 | MEDICATED_PAD | Freq: Once | CUTANEOUS | Status: DC

## 2019-03-13 MED ORDER — FENTANYL CITRATE (PF) 100 MCG/2ML IJ SOLN
INTRAMUSCULAR | Status: DC | PRN
  Administered 2019-03-13 (×2): 50 ug via INTRAVENOUS

## 2019-03-13 MED ORDER — ACETAMINOPHEN 500 MG PO TABS
1000.0000 mg | ORAL_TABLET | ORAL | Status: AC
  Administered 2019-03-13: 1000 mg via ORAL

## 2019-03-13 MED ORDER — MIDAZOLAM HCL 2 MG/2ML IJ SOLN
INTRAMUSCULAR | Status: AC
  Filled 2019-03-13: qty 2

## 2019-03-13 MED ORDER — ACETAMINOPHEN 500 MG PO TABS
ORAL_TABLET | ORAL | Status: AC
  Filled 2019-03-13: qty 2

## 2019-03-13 MED ORDER — DEXTROSE 5 % IV SOLN
3.0000 g | INTRAVENOUS | Status: AC
  Administered 2019-03-13: 2 g via INTRAVENOUS

## 2019-03-13 MED ORDER — MIDAZOLAM HCL 5 MG/5ML IJ SOLN
INTRAMUSCULAR | Status: DC | PRN
  Administered 2019-03-13: 2 mg via INTRAVENOUS

## 2019-03-13 MED ORDER — PROMETHAZINE HCL 25 MG/ML IJ SOLN: 6.2500 mg | INTRAMUSCULAR | Status: DC | PRN

## 2019-03-13 MED ORDER — MIDAZOLAM HCL 2 MG/2ML IJ SOLN: 1.0000 mg | INTRAMUSCULAR | Status: DC | PRN

## 2019-03-13 MED ORDER — OXYCODONE HCL 5 MG PO TABS: 5.0000 mg | ORAL_TABLET | Freq: Once | ORAL | Status: DC | PRN

## 2019-03-13 MED ORDER — OXYCODONE HCL 5 MG/5ML PO SOLN: 5.0000 mg | Freq: Once | ORAL | Status: DC | PRN

## 2019-03-13 MED ORDER — FENTANYL CITRATE (PF) 100 MCG/2ML IJ SOLN: 50.0000 ug | INTRAMUSCULAR | Status: DC | PRN

## 2019-03-13 MED ORDER — ONDANSETRON HCL 4 MG/2ML IJ SOLN
INTRAMUSCULAR | Status: AC
  Filled 2019-03-13: qty 2

## 2019-03-13 MED ORDER — FENTANYL CITRATE (PF) 100 MCG/2ML IJ SOLN: 25.0000 ug | INTRAMUSCULAR | Status: DC | PRN

## 2019-03-13 MED ORDER — ONDANSETRON HCL 4 MG/2ML IJ SOLN
INTRAMUSCULAR | Status: DC | PRN
  Administered 2019-03-13: 4 mg via INTRAVENOUS

## 2019-03-13 MED ORDER — PROPOFOL 500 MG/50ML IV EMUL
INTRAVENOUS | Status: DC | PRN
  Administered 2019-03-13: 40 mg via INTRAVENOUS
  Administered 2019-03-13: 140 ug/kg/min via INTRAVENOUS

## 2019-03-13 MED ORDER — LACTATED RINGERS IV SOLN: INTRAVENOUS | Status: DC

## 2019-03-13 SURGICAL SUPPLY — 38 items
ADH SKN CLS APL DERMABOND .7 (GAUZE/BANDAGES/DRESSINGS) ×1
APL PRP STRL LF DISP 70% ISPRP (MISCELLANEOUS) ×1
APL SKNCLS STERI-STRIP NONHPOA (GAUZE/BANDAGES/DRESSINGS)
BENZOIN TINCTURE PRP APPL 2/3 (GAUZE/BANDAGES/DRESSINGS) IMPLANT
BLADE SURG 15 STRL LF DISP TIS (BLADE) ×1 IMPLANT
BLADE SURG 15 STRL SS (BLADE) ×3
CHLORAPREP W/TINT 26 (MISCELLANEOUS) ×3 IMPLANT
CLOSURE WOUND 1/2 X4 (GAUZE/BANDAGES/DRESSINGS)
COVER BACK TABLE 60X90IN (DRAPES) ×3 IMPLANT
COVER MAYO STAND STRL (DRAPES) ×3 IMPLANT
COVER WAND RF STERILE (DRAPES) IMPLANT
DECANTER SPIKE VIAL GLASS SM (MISCELLANEOUS) ×3 IMPLANT
DERMABOND ADVANCED (GAUZE/BANDAGES/DRESSINGS) ×2
DERMABOND ADVANCED .7 DNX12 (GAUZE/BANDAGES/DRESSINGS) ×1 IMPLANT
DRAPE LAPAROTOMY 100X72 PEDS (DRAPES) ×3 IMPLANT
DRAPE UTILITY XL STRL (DRAPES) ×3 IMPLANT
ELECT REM PT RETURN 9FT ADLT (ELECTROSURGICAL) ×3
ELECTRODE REM PT RTRN 9FT ADLT (ELECTROSURGICAL) ×1 IMPLANT
GLOVE BIOGEL PI IND STRL 8 (GLOVE) ×1 IMPLANT
GLOVE BIOGEL PI INDICATOR 8 (GLOVE) ×2
GLOVE ECLIPSE 6.5 STRL STRAW (GLOVE) ×2 IMPLANT
GLOVE ECLIPSE 8.0 STRL XLNG CF (GLOVE) ×1 IMPLANT
GLOVE SURG SS PI 6.5 STRL IVOR (GLOVE) ×2 IMPLANT
GLOVE SURG SS PI 8.0 STRL IVOR (GLOVE) ×2 IMPLANT
GOWN STRL REUS W/ TWL LRG LVL3 (GOWN DISPOSABLE) ×2 IMPLANT
GOWN STRL REUS W/TWL LRG LVL3 (GOWN DISPOSABLE) ×6
NDL HYPO 25X1 1.5 SAFETY (NEEDLE) ×1 IMPLANT
NEEDLE HYPO 25X1 1.5 SAFETY (NEEDLE) ×3 IMPLANT
NS IRRIG 1000ML POUR BTL (IV SOLUTION) ×3 IMPLANT
PACK BASIN DAY SURGERY FS (CUSTOM PROCEDURE TRAY) ×3 IMPLANT
PENCIL SMOKE EVACUATOR (MISCELLANEOUS) ×2 IMPLANT
SLEEVE SCD COMPRESS KNEE MED (MISCELLANEOUS) ×2 IMPLANT
SPONGE LAP 4X18 RFD (DISPOSABLE) ×3 IMPLANT
STRIP CLOSURE SKIN 1/2X4 (GAUZE/BANDAGES/DRESSINGS) IMPLANT
SUT MON AB 4-0 PC3 18 (SUTURE) ×3 IMPLANT
SUT VICRYL 3-0 CR8 SH (SUTURE) ×3 IMPLANT
SYR CONTROL 10ML LL (SYRINGE) ×3 IMPLANT
TOWEL GREEN STERILE FF (TOWEL DISPOSABLE) ×3 IMPLANT

## 2019-03-13 NOTE — Discharge Instructions (Signed)
GENERAL SURGERY: POST OP INSTRUCTIONS  ######################################################################  EAT Gradually transition to a high fiber diet with a fiber supplement over the next few weeks after discharge.  Start with a pureed / full liquid diet (see below)  WALK Walk an hour a day.  Control your pain to do that.    CONTROL PAIN Control pain so that you can walk, sleep, tolerate sneezing/coughing, go up/down stairs.    Post Anesthesia Home Care Instructions  Activity: Get plenty of rest for the remainder of the day. A responsible individual must stay with you for 24 hours following the procedure.  For the next 24 hours, DO NOT: -Drive a car -Paediatric nurse -Drink alcoholic beverages -Take any medication unless instructed by your physician -Make any legal decisions or sign important papers.  Meals: Start with liquid foods such as gelatin or soup. Progress to regular foods as tolerated. Avoid greasy, spicy, heavy foods. If nausea and/or vomiting occur, drink only clear liquids until the nausea and/or vomiting subsides. Call your physician if vomiting continues.  Special Instructions/Symptoms: Your throat may feel dry or sore from the anesthesia or the breathing tube placed in your throat during surgery. If this causes discomfort, gargle with warm salt water. The discomfort should disappear within 24 hours.  If you had a scopolamine patch placed behind your ear for the management of post- operative nausea and/or vomiting:  1. The medication in the patch is effective for 72 hours, after which it should be removed.  Wrap patch in a tissue and discard in the trash. Wash hands thoroughly with soap and water. 2. You may remove the patch earlier than 72 hours if you experience unpleasant side effects which may include dry mouth, dizziness or visual disturbances. 3. Avoid touching the patch. Wash your hands with soap and water after contact with the patch.      HAVE A  BOWEL MOVEMENT DAILY Keep your bowels regular to avoid problems.  OK to try a laxative to override constipation.  OK to use an antidairrheal to slow down diarrhea.  Call if not better after 2 tries  CALL IF YOU HAVE PROBLEMS/CONCERNS Call if you are still struggling despite following these instructions. Call if you have concerns not answered by these instructions  ######################################################################   No tylenol until after 5:30pm today    1. DIET: Follow a light bland diet & liquids the first 24 hours after arrival home, such as soup, liquids, starches, etc.  Be sure to drink plenty of fluids.  Quickly advance to a usual solid diet within a few days.  Avoid fast food or heavy meals as your are more likely to get nauseated or have irregular bowels.  A low-fat, high-fiber diet for the rest of your life is ideal.   2. Take your usually prescribed home medications unless otherwise directed. 3. PAIN CONTROL: a. Pain is best controlled by a usual combination of three different methods TOGETHER: i. Ice/Heat ii. Over the counter pain medication iii. Prescription pain medication b. Most patients will experience some swelling and bruising around the incisions.  Ice packs or heating pads (30-60 minutes up to 6 times a day) will help. Use ice for the first few days to help decrease swelling and bruising, then switch to heat to help relax tight/sore spots and speed recovery.  Some people prefer to use ice alone, heat alone, alternating between ice & heat.  Experiment to what works for you.  Swelling and bruising can take several weeks to resolve.  c. It is helpful to take an over-the-counter pain medication regularly for the first few weeks.  Choose one of the following that works best for you: i. Naproxen (Aleve, etc)  Two 220mg  tabs twice a day ii. Ibuprofen (Advil, etc) Three 200mg  tabs four times a day (every meal & bedtime) iii. Acetaminophen (Tylenol, etc)  500-650mg  four times a day (every meal & bedtime) d. A  prescription for pain medication (such as oxycodone, hydrocodone, etc) should be given to you upon discharge.  Take your pain medication as prescribed.  i. If you are having problems/concerns with the prescription medicine (does not control pain, nausea, vomiting, rash, itching, etc), please call us [phone] to see if we need to switch you to a different pain medicine that will work better for you and/or control your side effect better. ii. If you need a refill on your pain medication, please contact your pharmacy.  They will contact our office to request authorization. Prescriptions will not be filled after 5 pm or on week-ends. 4. Avoid getting constipated.  Between the surgery and the pain medications, it is common to experience some constipation.  Increasing fluid intake and taking a fiber supplement (such as Metamucil, Citrucel, FiberCon, MiraLax, etc) 1-2 times a day regularly will usually help prevent this problem from occurring.  A mild laxative (prune juice, Milk of Magnesia, MiraLax, etc) should be taken according to package directions if there are no bowel movements after 48 hours.   5. Wash / shower every day.  You may shower over the dressings as they are waterproof.  Continue to shower over incision(s) after the dressing is off. 6. Remove your waterproof bandages 5 days after surgery.  You may leave the incision open to air.  You may have skin tapes (Steri Strips) covering the incision(s).  Leave them on until one week, then remove.  You may replace a dressing/Band-Aid to cover the incision for comfort if you wish.      7. ACTIVITIES as tolerated:   a. You may resume regular (light) daily activities beginning the next day--such as daily self-care, walking, climbing stairs--gradually increasing activities as tolerated.  If you can walk 30 minutes without difficulty, it is safe to try more intense activity such as jogging,  treadmill, bicycling, low-impact aerobics, swimming, etc. b. Save the most intensive and strenuous activity for last such as sit-ups, heavy lifting, contact sports, etc  Refrain from any heavy lifting or straining until you are off narcotics for pain control.   c. DO NOT PUSH THROUGH PAIN.  Let pain be your guide: If it hurts to do something, don't do it.  Pain is your body warning you to avoid that activity for another week until the pain goes down. d. You may drive when you are no longer taking prescription pain medication, you can comfortably wear a seatbelt, and you can safely maneuver your car and apply brakes. e. Dennis Bast may have sexual intercourse when it is comfortable.  8. FOLLOW UP in our office a. Please call [facility] at [phone] to set up an appointment to see your surgeon in the office for a follow-up appointment approximately 2-3 weeks after your surgery. b. Make sure that you call for this appointment the day you arrive home to insure a convenient appointment time. 9. IF YOU HAVE DISABILITY OR FAMILY LEAVE FORMS, BRING THEM TO THE OFFICE FOR PROCESSING.  DO NOT GIVE THEM TO YOUR DOCTOR.   WHEN TO CALL us [phone]: 1. Poor  pain control 2. Reactions / problems with new medications (rash/itching, nausea, etc)  3. Fever over 101.5 F (38.5 C) 4. Worsening swelling or bruising 5. Continued bleeding from incision. 6. Increased pain, redness, or drainage from the incision 7. Difficulty breathing / swallowing   The clinic staff is available to answer your questions during regular business hours (8:30am-5pm).  Please don't hesitate to call and ask to speak to one of our nurses for clinical concerns.   If you have a medical emergency, go to the nearest emergency room or call 911.  A surgeon from [facility] is always on call at the [facility], [place], [place], [place], [place]  [id number] ? MAIN: [phone] ? TOLL FREE:  [phone] ?  FAX [phone] [email or web address]

## 2019-03-13 NOTE — Anesthesia Postprocedure Evaluation (Signed)
Anesthesia Post Note  Patient: Veronica Reilly  Procedure(s) Performed: PORT REMOVAL (N/A Chest)     Patient location during evaluation: PACU Anesthesia Type: MAC Level of consciousness: awake and alert and oriented Pain management: pain level controlled Vital Signs Assessment: post-procedure vital signs reviewed and stable Respiratory status: spontaneous breathing, nonlabored ventilation and respiratory function stable Cardiovascular status: blood pressure returned to baseline Postop Assessment: no apparent nausea or vomiting Anesthetic complications: no    Last Vitals:  Vitals:   03/13/19 1345 03/13/19 1400  BP: 110/78 111/71  Pulse: 78 88  Resp: 17 17  Temp: 36.9 C 36.9 C  SpO2: 96% 99%    Last Pain:  Vitals:   03/13/19 1400  TempSrc:   PainSc: [place]

## 2019-03-13 NOTE — Op Note (Signed)

## 2019-03-13 NOTE — Transfer of Care (Signed)
Immediate Anesthesia Transfer of Care Note  Patient: Veronica Reilly  Procedure(s) Performed: PORT REMOVAL (N/A Chest)  Patient Location: PACU  Anesthesia Type:MAC  Level of Consciousness: awake, alert  and oriented  Airway & Oxygen Therapy: Patient Spontanous Breathing  Post-op Assessment: Report given to RN and Post -op Vital signs reviewed and stable  Post vital signs: Reviewed and stable  Last Vitals:  Vitals Value Taken Time  BP 113/71 03/13/19 1321  Temp    Pulse 79 03/13/19 1325  Resp 20 03/13/19 1325  SpO2 99 % 03/13/19 1325  Vitals shown include unvalidated device data.  Last Pain:  Vitals:   03/13/19 1137  TempSrc: Oral  PainSc: 0-No pain      Patients Stated Pain Goal: 3 (25/95/63 [id number])  Complications: No apparent anesthesia complications

## 2019-03-13 NOTE — Interval H&P Note (Signed)
History and Physical Interval Note:  03/13/2019 12:31 PM  [name]  has presented today for surgery, with the diagnosis of PORT IN PLACE.  The various methods of treatment have been discussed with the patient and family. After consideration of risks, benefits and other options for treatment, the patient has consented to  Procedure(s): PORT REMOVAL (N/A) as a surgical intervention.  The patient's history has been reviewed, patient examined, no change in status, stable for surgery.  I have reviewed the patient's chart and labs.  Questions were answered to the patient's satisfaction.     [place]

## 2019-03-13 NOTE — Anesthesia Preprocedure Evaluation (Addendum)
Anesthesia Evaluation  Patient identified by MRN, date of birth, ID band Patient awake    Reviewed: Allergy & Precautions, NPO status , Patient's Chart, lab work & pertinent test results  History of Anesthesia Complications Negative for: history of anesthetic complications  Airway Mallampati: II  TM Distance: >3 FB Neck ROM: Full    Dental  (+) Missing,    Pulmonary asthma ,    Pulmonary exam normal        Cardiovascular hypertension, Pt. on medications Normal cardiovascular exam     Neuro/Psych negative neurological ROS  negative psych ROS   GI/Hepatic Neg liver ROS, GERD  Medicated and Controlled,  Endo/Other  Hypothyroidism   Renal/GU negative Renal ROS  negative genitourinary   Musculoskeletal negative musculoskeletal ROS (+)   Abdominal   Peds  Hematology negative hematology ROS (+)   Anesthesia Other Findings Left breast ca s/p chemotherapy  Reproductive/Obstetrics negative OB ROS                            Anesthesia Physical Anesthesia Plan  ASA: II  Anesthesia Plan: MAC   Post-op Pain Management:    Induction:   PONV Risk Score and Plan: 2 and Treatment may vary due to age or medical condition, Propofol infusion and Midazolam  Airway Management Planned: Natural Airway and Simple Face Mask  Additional Equipment: None  Intra-op Plan:   Post-operative Plan:   Informed Consent: I have reviewed the patients History and Physical, chart, labs and discussed the procedure including the risks, benefits and alternatives for the proposed anesthesia with the patient or authorized representative who has indicated his/her understanding and acceptance.       Plan Discussed with: CRNA  Anesthesia Plan Comments:        Anesthesia Quick Evaluation

## 2019-03-14 ENCOUNTER — Encounter: Payer: Self-pay | Admitting: *Deleted

## 2019-03-16 ENCOUNTER — Encounter: Payer: Self-pay | Admitting: Adult Health

## 2019-03-18 ENCOUNTER — Ambulatory Visit: Payer: BC Managed Care – PPO

## 2019-03-18 ENCOUNTER — Ambulatory Visit: Payer: BC Managed Care – PPO | Attending: Internal Medicine

## 2019-03-18 DIAGNOSIS — Z23 Encounter for immunization: Secondary | ICD-10-CM

## 2019-03-18 NOTE — Progress Notes (Addendum)
   Covid-19 Vaccination Clinic  Name:  Veronica Reilly    MRN: [id number] DOB: 04-Jan-1966  03/18/2019  [name] was observed post Covid-19 immunization for 30 minutes without incidence. She was provided with Vaccine Information Sheet and instruction to access the V-Safe system.   Ms. Care was instructed to call 911 with any severe reactions post vaccine: Marland Kitchen Difficulty breathing  . Swelling of your face and throat  . A fast heartbeat  . A bad rash all over your body  . Dizziness and weakness    Immunizations Administered    Name Date Dose VIS Date Route   Moderna COVID-19 Vaccine 03/18/2019  1:01 PM 0.5 mL 12/19/2018 Intramuscular   Manufacturer: Moderna   Lot: [id number]   [id number]

## 2019-03-19 ENCOUNTER — Other Ambulatory Visit: Payer: Self-pay

## 2019-03-19 ENCOUNTER — Telehealth: Payer: Self-pay

## 2019-03-19 DIAGNOSIS — C50412 Malignant neoplasm of upper-outer quadrant of left female breast: Secondary | ICD-10-CM

## 2019-03-19 DIAGNOSIS — Z170 Estrogen receptor positive status [ER+]: Secondary | ICD-10-CM

## 2019-03-19 NOTE — Telephone Encounter (Signed)
Pt called to report swelling and pain to left breast X 2 weeks.    Pt with h/o lumpectomy to left breast.  Pt has completed chemotherapy and radiation.  Port removal on 2/23.    Pt denies any lumps or masses to left breast.  Pt reports tenderness and minimal swelling to left breast X 2 weeks.  Pain is controlled with OTC analgesics.   No trauma, injury, or over use to left axillary/arm.    RN educated on continuation of OTC analgesics, recommendations given for elevation of right arm while sitting and sleeping, verbalized instructions on self massages to area to decrease swelling.  Per MD recommendations pt will be set up at lymphedema clinic for evaluation as well.  Pt verbalized understanding and agreement.

## 2019-03-20 ENCOUNTER — Ambulatory Visit: Payer: BC Managed Care – PPO

## 2019-03-20 ENCOUNTER — Encounter: Payer: Self-pay | Admitting: Adult Health

## 2019-03-20 ENCOUNTER — Telehealth: Payer: Self-pay | Admitting: Adult Health

## 2019-03-20 ENCOUNTER — Telehealth: Payer: Self-pay | Admitting: *Deleted

## 2019-03-20 NOTE — Telephone Encounter (Signed)
Called and talked to patient.  She is at Dr. Josetta Huddle office to have her breast evaluated.  She notes her exhaustion and nausea from tamoxifen has improved.  She wants to know if she needs a sleeve.  I will mail her a script for a class 1 compression sleeve.

## 2019-03-20 NOTE — Telephone Encounter (Signed)
This RN called pt per her VM left earlier today stating she was having increased issue with the edema in her breast- including some " leaking ".  Per call this call - she has arranged for visit at Dr Irven Baltimore at 430 pm today.  She states she placed some pads on her breast for absorption- and had a meeting- after the meeting she states " the pads were soaked,and my bra, and it ran down on the floor in a puddle as well "  She states presently the edema has improved since the above occurrence.  She will keep her appointment with Dr Brantley Stage for evaluation and possible other needed interventions.

## 2019-03-21 ENCOUNTER — Telehealth: Payer: Self-pay

## 2019-03-21 ENCOUNTER — Encounter: Payer: Self-pay | Admitting: Rehabilitation

## 2019-03-21 ENCOUNTER — Other Ambulatory Visit: Payer: Self-pay

## 2019-03-21 ENCOUNTER — Ambulatory Visit: Payer: BC Managed Care – PPO | Attending: Oncology | Admitting: Rehabilitation

## 2019-03-21 DIAGNOSIS — N644 Mastodynia: Secondary | ICD-10-CM | POA: Diagnosis present

## 2019-03-21 DIAGNOSIS — R600 Localized edema: Secondary | ICD-10-CM | POA: Diagnosis present

## 2019-03-21 DIAGNOSIS — I890 Lymphedema, not elsewhere classified: Secondary | ICD-10-CM | POA: Diagnosis present

## 2019-03-21 NOTE — Therapy (Signed)
[place], [place], [address] Phone: [phone]   Fax:  [phone]  Physical Therapy Evaluation  Patient Details  Name: Veronica Reilly MRN: [id number] Date of Birth: 1965/11/28 Referring Provider (PT): Dr. Jana Hakim   Encounter Date: 03/21/2019  PT End of Session - 03/21/19 1850    Visit Number  1    Number of Visits  9    Date for PT Re-Evaluation  04/18/19    Authorization Type  [employer]    PT Start Time  1002    PT Stop Time  1046    PT Time Calculation (min)  44 min    Activity Tolerance  Patient tolerated treatment well    Behavior During Therapy  [facility] for tasks assessed/performed       Past Medical History:  Diagnosis Date  . ASCUS (atypical squamous cells of undetermined significance) on Pap smear   . Asthma    controlled with daily inhaler  . 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 Dysplasia of cervix, low grade (CIN 1)   . Eczema   . Family history of breast cancer   . Family history of lung cancer   . Family history of prostate cancer   . GERD (gastroesophageal reflux disease)   . Heart murmur   . History of chicken pox   . Hypertension   . Hypothyroid   . Uterine fibroid     Past Surgical History:  Procedure Laterality Date  . ADENOIDECTOMY    . BREAST LUMPECTOMY WITH RADIOACTIVE SEED AND SENTINEL LYMPH NODE BIOPSY Left 08/22/2018   Procedure: LEFT BREAST LUMPECTOMY WITH RADIOACTIVE SEED AND SENTINEL LYMPH NODE MAPPING;  Surgeon: Erroll Luna, MD;  Location: [place];  Service: General;  Laterality: Left;  . LEEP    . MYOMECTOMY    . PORT-A-CATH REMOVAL N/A 03/13/2019   Procedure: PORT REMOVAL;  Surgeon: Erroll Luna, MD;  Location: [place];  Service: General;  Laterality: N/A;  . PORTACATH PLACEMENT Right 09/27/2018   Procedure: INSERTION PORT-A-CATH WITH ULTRASOUND;  Surgeon: Erroll Luna, MD;  Location: [place];  Service: General;   Laterality: Right;  . TONSILECTOMY, ADENOIDECTOMY, BILATERAL MYRINGOTOMY AND TUBES    . TONSILLECTOMY      There were no vitals filed for this visit.   Subjective Assessment - 03/21/19 1011    Subjective  I was doing well but the end of radiation my skin was just falling off.  It seemed like the breast was just getting larger and larger but no pain.  After the port was removed it started getting painful and then the left arm starting hurting to move.  It is better now but yesterday it started leaking alot it was an infection I have had a seroma before and this has happened again.    Pertinent History  Post left breast lumpectomy with SLNB on 08/22/18 with 6 lymph nodes removed (all negative); chemotherapy and radiation completed.  Radiation completed 01/24/19 with boost session.  On Tamoxifen currently.    Patient Stated Goals  decrease breast swelling, prevent lymphedema, and get arm back to normal    Currently in Pain?  No/denies   just sitting ok        [facility] PT Assessment - 03/21/19 0001      Assessment   Medical Diagnosis  left breast cancer    Referring Provider (PT)  Dr. Jana Hakim    Onset Date/Surgical Date  08/22/18  Hand Dominance  Right    Next MD Visit  tomorrow    Prior Therapy  no      Precautions   Precaution Comments  lymphedema, infection, seroma      Balance Screen   Has the patient fallen in the past 6 months  No    Has the patient had a decrease in activity level because of a fear of falling?   No    Is the patient reluctant to leave their home because of a fear of falling?   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 peau de orange skin medial breast      Coordination   Gross Motor Movements are Fluid and Coordinated  Yes      Posture/Postural Control   Posture/Postural Control  Postural limitations    Postural Limitations  Rounded Shoulders;Forward head      ROM / Strength   AROM / PROM / Strength  --   not tested today due to infection     Palpation   Palpation comment  left breast warm and slightly red, no pitting        LYMPHEDEMA/ONCOLOGY QUESTIONNAIRE - 03/21/19 1028      Type   Cancer Type  left breast      Surgeries   Lumpectomy Date  08/22/18    Sentinel Lymph Node Biopsy Date  08/22/18    Number Lymph Nodes Removed  6   all neg     Treatment   Active Chemotherapy Treatment  No    Past Chemotherapy Treatment  Yes    Active Radiation Treatment  No    Past Radiation Treatment  Yes    Current Hormone Treatment  Yes    Drug Name  tamoxifen      What other symptoms do you have   Are you Having Heaviness or Tightness  Yes      Lymphedema Stage   Stage  STAGE 1 SPONTANEOUSLY REVERSIBLE      Lymphedema Assessments   Lymphedema Assessments  Upper extremities      Right Upper Extremity Lymphedema   15 cm Proximal to Olecranon Process  35.6 cm    10 cm Proximal to Olecranon Process  35 cm    Olecranon Process  29.7 cm    15 cm Proximal to Ulnar Styloid Process  26.3 cm    10 cm Proximal to Ulnar Styloid Process  21.8 cm    Just Proximal to Ulnar Styloid Process  16.7 cm    Across Hand at [employer]  19.3 cm    At [place] of 2nd Digit  6.5 cm      Left Upper Extremity Lymphedema   15 cm Proximal to Olecranon Process  36.7 cm    10  cm Proximal to Olecranon Process  36 cm    Olecranon Process  30.3 cm     15 cm Proximal to Ulnar Styloid Process  26.3 cm    10 cm Proximal to Ulnar Styloid Process  21.8 cm    Just Proximal to Ulnar Styloid Process  16.7 cm    Across Hand at [employer]  19.4 cm    At [place] of 2nd Digit  6.3 cm             Objective measurements completed on examination: See above findings.      [place] Adult PT Treatment/Exercise - 03/21/19 0001      Self-Care   Self-Care  Other Self-Care Comments    Other Self-Care Comments   education on lymphedema risk reduction, anatomy, and physiology as well as risk factors.  Education on use of compression sleeve and using compression for seroma care.  Pt given prescription for bras to take to Dr. Brantley Stage tomorrow if agreeable.               PT Education - 03/21/19 1849    Education Details  lympedhema, treatment options, POC, risk reduction, use of compression    Person(s) Educated  Patient    Methods  Explanation;Demonstration;Handout    Comprehension  Verbalized understanding          PT Long Term Goals - 03/21/19 1859      PT LONG TERM GOAL #1   Title  Pt will be ind with self care for the left breast lymphedema and seroma to include self MLD and compression    Time  4    Period  Weeks    Status  New      PT LONG TERM GOAL #2   Title  Pt will demonstrate shoulder AROM WNL    Time  4    Period  Weeks    Status  New      PT LONG TERM GOAL #3   Title  Pt will be set up to obtain appropraite compression garment for the left UE    Time  4    Period  Weeks    Status  New             Plan - 03/21/19 1851    Clinical Impression Statement  Pt presents to therapy with active left breast seroma and infection with current significant drainage occuring and pt reporting around 2cmx12cm open tunnel space that is being packed.  Pt started antibiotics today.  Hard to differentiate seroma vs post radiation general edema vs lymphedema changes but seems to be consistent with beginnings of lymphedema plus  seroma complication due to skin changes.  Pt also has a slight increase in the size of the left upper arm.  Pt was educated on lymphedema and risk reduction as well as treatment options.  No other measurements taken today due to breast status.  Will check back in in 1 week to see status and progress as needed.    Personal Factors and Comorbidities  Comorbidity 3+    Comorbidities  SLNB, radiation, seroma    Examination-Activity Limitations  Sleep;Lift    Examination-Participation Restrictions  Cleaning;Community Activity    Stability/Clinical Decision Making  Evolving/Moderate complexity    Clinical Decision Making  Moderate    Rehab Potential  Excellent    PT Frequency  2x / week    PT Duration  4 weeks    PT Treatment/Interventions  ADLs/Self Care Home  Management;Manual lymph drainage;Scar mobilization;Passive range of motion;Manual techniques;Taping;Therapeutic exercise    PT Next Visit Plan  check infection status, how has swelling changed?, still open? get compression bra or garments/appt? pt is getting compression sleeve prescription sent in the mail per chart review, check AROM as able, progress treatment as indicated to include: MLD/education on self MLD, use of breast garments, ROM and strengthening as needed    Recommended Other Services  bras and sleeve, given script to get signed by MD    Consulted and Agree with Plan of Care  Patient       Patient will benefit from skilled therapeutic intervention in order to improve the following deficits and impairments:  Decreased skin integrity, Increased edema, Decreased activity tolerance, Decreased knowledge of use of DME, Decreased knowledge of precautions, Pain  Visit Diagnosis: Lymphedema  Localized edema  Breast pain     Problem List Patient Active Problem List   Diagnosis Date Noted  . Port-A-Cath in place 09/29/2018  . Genetic testing 07/19/2018  . Family history of breast cancer   . Family history of prostate cancer   .  Family history of lung cancer   . Malignant neoplasm of upper-outer quadrant of left breast in female, estrogen receptor positive ([place]) 07/05/2018  . Moderate persistent asthma with (acute) exacerbation 03/30/2018  . Seasonal and perennial allergic rhinoconjunctivitis 03/30/2018  . 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 Hypothyroidism 02/09/2012  . Fibroids 02/09/2012  . Dysplasia of cervix, low grade (CIN 1) 09/21/2011    Stark Bray 03/21/2019, 7:03 PM  [place], [place], [address] Phone: [phone]   Fax:  [phone]  Name: Veronica Reilly MRN: [id number] Date of Birth: 1965-08-24

## 2019-03-21 NOTE — Telephone Encounter (Signed)
Spoke with pt by phone and to inform her we would be mailing her the prescription for a compression sleeve.

## 2019-03-22 ENCOUNTER — Ambulatory Visit: Payer: BC Managed Care – PPO

## 2019-03-28 ENCOUNTER — Ambulatory Visit: Payer: BC Managed Care – PPO | Admitting: Rehabilitation

## 2019-04-17 ENCOUNTER — Ambulatory Visit: Payer: BC Managed Care – PPO | Attending: Internal Medicine

## 2019-04-17 DIAGNOSIS — Z23 Encounter for immunization: Secondary | ICD-10-CM

## 2019-04-17 NOTE — Progress Notes (Signed)
   Covid-19 Vaccination Clinic  Name:  Veronica Reilly    MRN: [id number] DOB: Sep 07, 1965  04/17/2019  [name] was observed post Covid-19 immunization for 15 minutes without incident. She was provided with Vaccine Information Sheet and instruction to access the V-Safe system.   [name] was instructed to call 911 with any severe reactions post vaccine: Marland Kitchen Difficulty breathing  . Swelling of face and throat  . A fast heartbeat  . A bad rash all over body  . Dizziness and weakness   Immunizations Administered    Name Date Dose VIS Date Route   Moderna COVID-19 Vaccine 04/17/2019  2:46 PM 0.5 mL 12/19/2018 Intramuscular   Manufacturer: Moderna   Lot: [id number]   [id number]

## 2019-04-21 ENCOUNTER — Ambulatory Visit: Payer: BC Managed Care – PPO

## 2019-05-02 ENCOUNTER — Other Ambulatory Visit: Payer: Self-pay | Admitting: *Deleted

## 2019-05-02 DIAGNOSIS — C50412 Malignant neoplasm of upper-outer quadrant of left female breast: Secondary | ICD-10-CM

## 2019-05-02 DIAGNOSIS — Z170 Estrogen receptor positive status [ER+]: Secondary | ICD-10-CM

## 2019-05-02 NOTE — Progress Notes (Signed)
[place]  Telephone:([phone]) [phone] Fax:([phone]) [phone]     ID: Veronica Reilly DOB: 1965-10-05  MR#: [id number]  XIH#:[id number]  Patient Care Team: Leeroy Cha, MD as PCP - General (Internal Medicine) Eula Mazzola, Virgie Dad, MD as Consulting Physician (Oncology) Kyung Rudd, MD as Consulting Physician (Radiation Oncology) Erroll Luna, MD as Consulting Physician (General Surgery) Delsa Bern, MD as Consulting Physician (Obstetrics and Gynecology) Kennith Gain, MD as Consulting Physician (Allergy) Delrae Rend, MD as Consulting Physician (Endocrinology) Juanita Craver, MD as Consulting Physician (Gastroenterology) Chauncey Cruel, MD OTHER MD:  CHIEF COMPLAINT: estrogen receptor positive breast cancer  CURRENT TREATMENT: tamoxifen   INTERVAL HISTORY: Veronica Reilly returns today for follow up of her estrogen receptor positive breast cancer.  Since her last visit, she underwent port removal on 03/13/2019 under Dr. Brantley Stage.  She tolerated that with no problems.  On the other hand she did develop an infection in her left breast, which was treated with antibiotics and now is completing healing by secondary intention.  She has had multiple visits with Dr. Brantley Stage most recently 2 days ago and things are going well she feels.  She started tamoxifen in January.  She is tolerating that remarkably well.  She has some hot flashes at night, a little bit of vaginal stickiness, but no other side effects and she "feels great".   REVIEW OF SYSTEMS: Kinnedy has a treadmill at home which she uses for exercise.  She is retiring for her job in [employer] and starting a job in [place] next week.  This will be a little bit more moderate, with steady hours, and generally less stressful.  She and her husband both have had the Moderna vaccine shots, which they tolerated well.  A detailed review of systems was otherwise stable.     HISTORY OF CURRENT ILLNESS: From  the original intake note:  Veronica Reilly had some calcifications in the left breast noted March 2019 on screening mammography.. She underwent biopsy of the left retroareolar area of concern on 03/18/2017 with pathology ([id number]) showing fibrocystic changes with no malignancy. Return in 6 months for left diagnostic mammography and ultrasonography was recommended, and she reports this took place in November 2019 at her gynecologist's office and the changes previously noted were stable.  She underwent bilateral diagnostic mammography with tomography and left breast ultrasonography at [facility] on 06/27/2018 showing: breast density category C; left breast upper-outer quadrant 3.1 cm group of indeterminate calcifications; persistent benign-appearing cyst in the subareolar left breast, post core needle biopsy changes.  On physical exam there were no palpable masses.  Targeted ultrasound revealed no suspicious masses.  There was a left subareolar breast cyst measuring 1.0 cm.  Left axilla was unremarkable.  Accordingly on 06/29/2018 she proceeded to biopsy of the left breast calcifications area in question. The pathology from this procedure ([id number]) showed: invasive ductal carcinoma, grade 2, with ductal carcinoma in situ. Prognostic indicators significant for: estrogen receptor, 100% positive with strong staining intensity and progesterone receptor, 0% negative. Proliferation marker Ki67 at 10%. HER2 negtive by immunohistochemistry, (0).  The patient's subsequent history is as detailed below.   PAST MEDICAL HISTORY: Past Medical History:  Diagnosis Date  . ASCUS (atypical squamous cells of undetermined significance) on Pap smear   . Asthma    controlled with daily inhaler  . Breast cancer ([place]) 06/29/2018   Left IDC  . Dysplasia of cervix, low grade (CIN 1)   . Eczema   . Family history  of breast cancer   . Family history of lung cancer   . Family history of prostate cancer   . GERD  (gastroesophageal reflux disease)   . Heart murmur   . History of chicken pox   . Hypertension   . Hypothyroid   . Uterine fibroid   Heart murmur, evaluated by cardiologist   PAST SURGICAL HISTORY: Past Surgical History:  Procedure Laterality Date  . ADENOIDECTOMY    . BREAST LUMPECTOMY WITH RADIOACTIVE SEED AND SENTINEL LYMPH NODE BIOPSY Left 08/22/2018   Procedure: LEFT BREAST LUMPECTOMY WITH RADIOACTIVE SEED AND SENTINEL LYMPH NODE MAPPING;  Surgeon: Erroll Luna, MD;  Location: [facility];  Service: General;  Laterality: Left;  . LEEP    . MYOMECTOMY    . PORT-A-CATH REMOVAL N/A 03/13/2019   Procedure: PORT REMOVAL;  Surgeon: Erroll Luna, MD;  Location: [facility];  Service: General;  Laterality: N/A;  . PORTACATH PLACEMENT Right 09/27/2018   Procedure: INSERTION PORT-A-CATH WITH ULTRASOUND;  Surgeon: Erroll Luna, MD;  Location: [facility];  Service: General;  Laterality: Right;  . TONSILECTOMY, ADENOIDECTOMY, BILATERAL MYRINGOTOMY AND TUBES    . TONSILLECTOMY      FAMILY HISTORY: Family History  Problem Relation Age of Onset  . Breast cancer Maternal Grandmother   . Heart disease Father   . Diabetes Father   . Prostate cancer Father 39  . Breast cancer Mother 29  . Hypertension Other   . Lung cancer Maternal Aunt   . Allergic rhinitis Neg Hx   . Angioedema Neg Hx   . Asthma Neg Hx   . Eczema Neg Hx   . Immunodeficiency Neg Hx   . Urticaria Neg Hx    Patient's father was 50 years old when he died from diabetes and renal failure. Patient's mother died from breast cancer at age 70. The patient notes a family hx of breast and possibly ovarian cancer. Her mother was diagnosed with inflammatory breast cancer at around age 33 (she was a patient here), and the patient maternal grandmother was also diagnosed with breast cancer and died at age 52.  The patient has 1 brother, no sisters. She also had one maternal aunt with lung cancer and a history  of smoking, her father with prostate cancer at age 8, and a possible GYN cancer in a paternal aunt.   GYNECOLOGIC HISTORY:  Last menstrual period February 2020  menarche: 54 years old GX P 0, she has had fertility treatments LMP 05/2018, lasted 3 days, irregular (s/p ablation) Contraceptive: used for apprx. 6 months HRT never used  Hysterectomy? no BSO? 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 Husband Celesta Gentile is a trauma nurse at [employer]. She lives at home with her husband and some hermit crabs and salt and fresh water fish. 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    ADVANCED DIRECTIVES: Husband Celesta Gentile is her HCPOA.   HEALTH MAINTENANCE: Social History   Tobacco Use  . Smoking status: Never Smoker  . Smokeless tobacco: Never Used  Substance Use Topics  . Alcohol use: No  . Drug use: No     Colonoscopy: Pending  PAP: Up-to-date  Bone density: never done   Allergies  Allergen Reactions  . Apple Anaphylaxis  .  Iohexol     Contrast dye  . Other Shortness Of Breath    CHERRIES, TREES, MOLD,DUST, COCKROACHES  . Povidone-Iodine Anaphylaxis  . Shellfish Allergy Anaphylaxis  . Peach [Prunus Persica] Itching and Hives    mouth  . Zocor [Simvastatin] Other (See Comments)    Alopecia  . Betadine [Povidone Iodine]     Due to shellfish allergy  . Latex Rash    Current Outpatient Medications  Medication Sig Dispense Refill  . albuterol (VENTOLIN HFA) 108 (90 Base) MCG/ACT inhaler Use 2 puffs every four hours as needed for cough or wheeze.  May use 2 puffs 10-20 minutes prior to exercise. 18 g 1  . budesonide-formoterol (SYMBICORT) 160-4.5 MCG/ACT inhaler INHALE 2 PUFFS INTO THE LUNGS 2 (TWO) TIMES DAILY. 10.2 g 5  .  Cholecalciferol (VITAMIN D3) 50 MCG (2000 UT) TABS Take 2,000 Units by mouth every evening.    Marland Kitchen EPINEPHrine (EPIPEN 2-PAK) 0.3 mg/0.3 mL IJ SOAJ injection Inject 0.3 mLs (0.3 mg total) into the muscle as needed for anaphylaxis. Use as directed for a threatening allergic reaction 2 each 1  . esomeprazole (NEXIUM 24HR) 20 MG capsule Take 20 mg by mouth daily at 12 noon.    . folic acid (FOLVITE) 1 MG tablet Take 1 mg by mouth every evening.     . hydrochlorothiazide (MICROZIDE) 12.5 MG capsule Take 12.5 mg by mouth daily.   1  . levocetirizine (XYZAL) 5 MG tablet Take 1 tablet (5 mg total) by mouth daily. 30 tablet 5  . levothyroxine (SYNTHROID, LEVOTHROID) 112 MCG tablet Take 112 mcg by mouth daily before breakfast.     . montelukast (SINGULAIR) 10 MG tablet Take 1 tablet (10 mg total) by mouth at bedtime. 30 tablet 5  . NIFEdipine (PROCARDIA XL/ADALAT-CC) 60 MG 24 hr tablet Take 60 mg by mouth daily.  1  . Olopatadine HCl (PAZEO) 0.7 % SOLN Apply 1 drop to eye daily. 2.5 mL 5  . tamoxifen (NOLVADEX) 20 MG tablet Take 20 mg by mouth daily.    Marland Kitchen triamcinolone (NASACORT ALLERGY 24HR) 55 MCG/ACT AERO nasal inhaler Place 2 sprays into the nose daily.     No current facility-administered medications for this visit.    OBJECTIVE: African-American woman in no acute distress  Vitals:   05/03/19 1036  BP: 134/90  Pulse: 86  Resp: 20  Temp: 98.5 F (36.9 C)  SpO2: 100%     Body mass index is 35.23 kg/m.   Wt Readings from Last 3 Encounters:  05/03/19 218 lb 4.8 oz (99 kg)  03/13/19 220 lb 3.8 oz (99.9 kg)  02/02/19 222 lb (100.7 kg)  ECOG FS:1 - Symptomatic but completely ambulatory  Sclerae unicteric, EOMs intact Wearing a mask No cervical or supraclavicular adenopathy Lungs no rales or rhonchi Heart regular rate and rhythm Abd soft, nontender, positive bowel sounds MSK no focal spinal tenderness, no upper extremity lymphedema Neuro: nonfocal, well oriented, appropriate affect  Breasts: The right breast is unremarkable.  The left breast is still somewhat swollen, hyperpigmented, and the left axillary incision is healing by secondary intention as imaged below.  There is no erythema.  There is no evidence of local recurrence.  Left breast 05/03/2019    LAB RESULTS:  CMP     Component Value Date/Time   NA 142 05/03/2019 1020   K 3.8 05/03/2019 1020   CL 109 05/03/2019 1020   CO2 24 05/03/2019 1020   GLUCOSE 97 05/03/2019 1020   BUN 15  05/03/2019 1020   CREATININE 0.81 05/03/2019 1020   CALCIUM 9.1 05/03/2019 1020   PROT 7.4 05/03/2019 1020   ALBUMIN 3.6 05/03/2019 1020   AST 10 (L) 05/03/2019 1020   ALT 8 05/03/2019 1020   ALKPHOS 61 05/03/2019 1020   BILITOT 0.5 05/03/2019 1020   GFRNONAA >60 05/03/2019 1020   GFRAA >60 05/03/2019 1020    No results found for: TOTALPROTELP, ALBUMINELP, A1GS, A2GS, BETS, BETA2SER, GAMS, MSPIKE, SPEI  No results found for: KPAFRELGTCHN, LAMBDASER, KAPLAMBRATIO  Lab Results  Component Value Date   WBC 7.1 05/03/2019   NEUTROABS 4.6 05/03/2019   HGB 11.6 (L) 05/03/2019   HCT 35.5 (L) 05/03/2019   MCV 75.5 (L) 05/03/2019   PLT 252 05/03/2019   No results found for: LABCA2  No components found for: EZMOQH476  No results for input(s): INR in the last 168 hours.  No results found for: LABCA2  No results found for: LYY503  No results found for: TWS568  No results found for: LEX517  No results found for: CA2729  No components found for: HGQUANT  No results found for: CEA1 / No results found for: CEA1   No results found for: AFPTUMOR  No results found for: CHROMOGRNA  No results found for: HGBA, HGBA2QUANT, HGBFQUANT, HGBSQUAN (Hemoglobinopathy evaluation)   No results found for: LDH  No results found for: IRON, TIBC, IRONPCTSAT (Iron and TIBC)  Lab Results  Component Value Date   FERRITIN 143 11/09/2018    Urinalysis    Component Value Date/Time   COLORURINE YELLOW 05/15/2007 0957    APPEARANCEUR CLEAR 05/15/2007 0957   LABSPEC 1.010 05/15/2007 0957   PHURINE 8.0 05/15/2007 0957   GLUCOSEU NEGATIVE 05/15/2007 0957   HGBUR NEGATIVE 05/15/2007 0957   BILIRUBINUR NEGATIVE 05/15/2007 0957   KETONESUR NEGATIVE 05/15/2007 0957   PROTEINUR NEGATIVE 05/15/2007 0957   UROBILINOGEN 0.2 05/15/2007 0957   NITRITE NEGATIVE 05/15/2007 0957   LEUKOCYTESUR  05/15/2007 0957    NEGATIVE MICROSCOPIC NOT DONE ON URINES WITH NEGATIVE PROTEIN, BLOOD, LEUKOCYTES, NITRITE, OR GLUCOSE <1000 mg/dL.    STUDIES: No results found.   ELIGIBLE FOR AVAILABLE RESEARCH PROTOCOL: no  ASSESSMENT: 54 y.o.  [place] status post left breast upper outer quadrant biopsy 06/29/2018 for a clinical T2N0 invasive ductal carcinoma, grade 2, estrogen receptor positive, progesterone receptor negative, with an MIB-1-1 of 10%, and no HER-2 amplification.  (1) genetics 07/18/2018 through the [facility] Common Hereditary Cancers Panel found no deleterious mutations in APC, ATM, AXIN2, BARD1, BMPR1A, BRCA1, BRCA2, BRIP1, CDH1, CDKN2A (p14ARF), CDKN2A (p16INK4a), CKD4, CHEK2, CTNNA1, DICER1, EPCAM (Deletion/duplication testing only), GREM1 (promoter region deletion/duplication testing only), KIT, MEN1, MLH1, MSH2, MSH3, MSH6, MUTYH, NBN, NF1, NHTL1, PALB2, PDGFRA, PMS2, POLD1, POLE, PTEN, RAD50, RAD51C, RAD51D, SDHB, SDHC, SDHD, SMAD4, SMARCA4. STK11, TP53, TSC1, TSC2, and VHL.  The following genes were evaluated for sequence changes only: SDHA and HOXB13 c.251G>A variant only.   (2) status post left lumpectomy and sentinel lymph node sampling 08/22/2018 for a pT2 pN0, stage IB invasive ductal carcinoma, grade 2, with negative margins  (a) a total of 10 axillary lymph nodes were removed  (3) Oncotype score of 34 predicts a risk of recurrence outside the breast in the next 9 years of 22% if the patient's only systemic therapy is antiestrogens for 5 years.  It also predicts significant benefit from chemotherapy.  (4)  adjuvant chemotherapy consisting of cyclophosphamide and docetaxel given every 21 days x 4 from 09/29/2018 through 11/30/2018  (5) adjuvant  radiation 12/26/18 - 01/24/19 Site/dose:   The patient initially received a dose of 42.56 Gy in 16 fractions to the breast using whole-breast tangent fields. This was delivered using a 3-D conformal technique. The patient then received a boost to the seroma. This delivered an additional 8 Gy in 39factions using a 3 field photon technique due to the depth of the seroma. The total dose was 50.56 Gy.  (6) tamoxifen started 01/24/2018   PLAN: MLebais tolerating tamoxifen well and the plan will be to continue that a total of 5 years.  If that the nighttime hot flashes get worse we can always add gabapentin but at this point it does not appear to be necessary.  She has a lot on her plate, changing jobs, continuing to deal with the residual infection in the left breast, and of course dealing with all the changes associated with the diagnosis of cancer.  Nevertheless she is doing remarkably well.  We reviewed her MCVs and hemoglobins.  I find the whole picture a little confusing since she did have a good ferritin in October last year with low MCV.  She could have thalassemia.  I am going to add the appropriate labs before the next visit here.  In the meantime it would not hurt if she went back to her iron supplementation  She knows to call for any other issue that may develop before the next visit  Total encounter time 35 minutes.*Sarajane JewsC. Mailin Coglianese, MD Medical Oncology and Hematology [facility] [place] [id number]. [phone]   Fax. [phone]  I, KWilburn Mylar am acting as scribe for Dr. GVirgie Dad Ezequiel Macauley.  I, GLurline DelMD, have reviewed the above documentation for accuracy and completeness, and I agree with the above.   *Total Encounter Time as defined by the Centers for Medicare and Medicaid  Services includes, in addition to the face-to-face time of a patient visit (documented in the note above) non-face-to-face time: obtaining and reviewing outside history, ordering and reviewing medications, tests or procedures, care coordination (communications with other health care professionals or caregivers) and documentation in the medical record.

## 2019-05-03 ENCOUNTER — Encounter: Payer: Self-pay | Admitting: Adult Health

## 2019-05-03 ENCOUNTER — Inpatient Hospital Stay: Payer: BC Managed Care – PPO

## 2019-05-03 ENCOUNTER — Inpatient Hospital Stay: Payer: BC Managed Care – PPO | Attending: Oncology | Admitting: Oncology

## 2019-05-03 ENCOUNTER — Other Ambulatory Visit: Payer: Self-pay

## 2019-05-03 VITALS — BP 134/90 | HR 86 | Temp 98.5°F | Resp 20 | Ht 66.0 in | Wt 218.3 lb

## 2019-05-03 DIAGNOSIS — Z9221 Personal history of antineoplastic chemotherapy: Secondary | ICD-10-CM | POA: Diagnosis not present

## 2019-05-03 DIAGNOSIS — C50412 Malignant neoplasm of upper-outer quadrant of left female breast: Secondary | ICD-10-CM

## 2019-05-03 DIAGNOSIS — E038 Other specified hypothyroidism: Secondary | ICD-10-CM | POA: Diagnosis not present

## 2019-05-03 DIAGNOSIS — Z170 Estrogen receptor positive status [ER+]: Secondary | ICD-10-CM

## 2019-05-03 DIAGNOSIS — J4541 Moderate persistent asthma with (acute) exacerbation: Secondary | ICD-10-CM

## 2019-05-03 DIAGNOSIS — Z923 Personal history of irradiation: Secondary | ICD-10-CM | POA: Diagnosis not present

## 2019-05-03 DIAGNOSIS — Z79810 Long term (current) use of selective estrogen receptor modulators (SERMs): Secondary | ICD-10-CM | POA: Diagnosis not present

## 2019-05-03 LAB — CBC WITH DIFFERENTIAL (CANCER CENTER ONLY)
Abs Immature Granulocytes: 0.02 10*3/uL (ref 0.00–0.07)
Basophils Absolute: 0.1 10*3/uL (ref 0.0–0.1)
Basophils Relative: 1 %
Eosinophils Absolute: 0.2 10*3/uL (ref 0.0–0.5)
Eosinophils Relative: 2 %
HCT: 35.5 % — ABNORMAL LOW (ref 36.0–46.0)
Hemoglobin: 11.6 g/dL — ABNORMAL LOW (ref 12.0–15.0)
Immature Granulocytes: 0 %
Lymphocytes Relative: 18 %
Lymphs Abs: 1.3 10*3/uL (ref 0.7–4.0)
MCH: 24.7 pg — ABNORMAL LOW (ref 26.0–34.0)
MCHC: 32.7 g/dL (ref 30.0–36.0)
MCV: 75.5 fL — ABNORMAL LOW (ref 80.0–100.0)
Monocytes Absolute: 0.9 10*3/uL (ref 0.1–1.0)
Monocytes Relative: 12 %
Neutro Abs: 4.6 10*3/uL (ref 1.7–7.7)
Neutrophils Relative %: 67 %
Platelet Count: 252 10*3/uL (ref 150–400)
RBC: 4.7 MIL/uL (ref 3.87–5.11)
RDW: 16.2 % — ABNORMAL HIGH (ref 11.5–15.5)
WBC Count: 7.1 10*3/uL (ref 4.0–10.5)
nRBC: 0 % (ref 0.0–0.2)

## 2019-05-03 LAB — CMP (CANCER CENTER ONLY)
ALT: 8 U/L (ref 0–44)
AST: 10 U/L — ABNORMAL LOW (ref 15–41)
Albumin: 3.6 g/dL (ref 3.5–5.0)
Alkaline Phosphatase: 61 U/L (ref 38–126)
Anion gap: 9 (ref 5–15)
BUN: 15 mg/dL (ref 6–20)
CO2: 24 mmol/L (ref 22–32)
Calcium: 9.1 mg/dL (ref 8.9–10.3)
Chloride: 109 mmol/L (ref 98–111)
Creatinine: 0.81 mg/dL (ref 0.44–1.00)
GFR, Est AFR Am: >60 mL/min (ref >60)
GFR, Estimated: >60 mL/min (ref >60)
Glucose, Bld: 97 mg/dL (ref 70–99)
Potassium: 3.8 mmol/L (ref 3.5–5.1)
Sodium: 142 mmol/L (ref 135–145)
Total Bilirubin: 0.5 mg/dL (ref 0.3–1.2)
Total Protein: 7.4 g/dL (ref 6.5–8.1)

## 2019-05-07 ENCOUNTER — Telehealth: Payer: Self-pay | Admitting: Oncology

## 2019-05-07 NOTE — Telephone Encounter (Signed)
Scheduled appts per 4/15 los. Pt confirmed appt date and time.

## 2019-09-05 ENCOUNTER — Other Ambulatory Visit: Payer: Self-pay | Admitting: Allergy

## 2019-09-07 ENCOUNTER — Other Ambulatory Visit: Payer: Self-pay | Admitting: *Deleted

## 2019-09-07 DIAGNOSIS — J4540 Moderate persistent asthma, uncomplicated: Secondary | ICD-10-CM

## 2019-09-07 MED ORDER — MONTELUKAST SODIUM 10 MG PO TABS: 10.0000 mg | ORAL_TABLET | Freq: Every day | ORAL | 0 refills | Status: DC

## 2019-09-07 NOTE — Telephone Encounter (Signed)
Courtesy refill of montelukast sent to [facility]. Pt needs office visit for additional refills.

## 2019-09-10 ENCOUNTER — Other Ambulatory Visit: Payer: Self-pay

## 2019-09-10 ENCOUNTER — Encounter: Payer: Self-pay | Admitting: Adult Health

## 2019-09-10 DIAGNOSIS — J4540 Moderate persistent asthma, uncomplicated: Secondary | ICD-10-CM

## 2019-09-10 MED ORDER — MONTELUKAST SODIUM 10 MG PO TABS: ORAL_TABLET | ORAL | 0 refills | Status: DC

## 2019-10-06 ENCOUNTER — Other Ambulatory Visit: Payer: Self-pay | Admitting: Allergy

## 2019-10-06 DIAGNOSIS — J4540 Moderate persistent asthma, uncomplicated: Secondary | ICD-10-CM

## 2019-10-19 HISTORY — PX: BREAST BIOPSY: SHX20

## 2019-10-26 ENCOUNTER — Other Ambulatory Visit: Payer: Self-pay | Admitting: Oncology

## 2019-10-26 ENCOUNTER — Ambulatory Visit
Admission: RE | Admit: 2019-10-26 | Discharge: 2019-10-26 | Disposition: A | Discharge status: Home / Self Care | Payer: BC Managed Care – PPO | Source: Ambulatory Visit | Attending: Oncology | Admitting: Oncology

## 2019-10-26 ENCOUNTER — Other Ambulatory Visit: Payer: Self-pay

## 2019-10-26 DIAGNOSIS — Z170 Estrogen receptor positive status [ER+]: Secondary | ICD-10-CM

## 2019-10-26 DIAGNOSIS — E038 Other specified hypothyroidism: Secondary | ICD-10-CM

## 2019-10-26 DIAGNOSIS — J4541 Moderate persistent asthma with (acute) exacerbation: Secondary | ICD-10-CM

## 2019-10-26 DIAGNOSIS — R921 Mammographic calcification found on diagnostic imaging of breast: Secondary | ICD-10-CM

## 2019-10-26 HISTORY — DX: Personal history of antineoplastic chemotherapy: Z92.21

## 2019-10-26 HISTORY — DX: Personal history of irradiation: Z92.3

## 2019-10-27 ENCOUNTER — Encounter: Payer: Self-pay | Admitting: Adult Health

## 2019-10-29 ENCOUNTER — Other Ambulatory Visit: Payer: Self-pay

## 2019-10-29 ENCOUNTER — Telehealth: Payer: Self-pay | Admitting: Allergy

## 2019-10-29 ENCOUNTER — Inpatient Hospital Stay: Payer: BC Managed Care – PPO | Attending: Oncology

## 2019-10-29 DIAGNOSIS — Z79810 Long term (current) use of selective estrogen receptor modulators (SERMs): Secondary | ICD-10-CM | POA: Insufficient documentation

## 2019-10-29 DIAGNOSIS — Z7951 Long term (current) use of inhaled steroids: Secondary | ICD-10-CM | POA: Diagnosis not present

## 2019-10-29 DIAGNOSIS — Z79899 Other long term (current) drug therapy: Secondary | ICD-10-CM | POA: Diagnosis not present

## 2019-10-29 DIAGNOSIS — I2510 Atherosclerotic heart disease of native coronary artery without angina pectoris: Secondary | ICD-10-CM | POA: Insufficient documentation

## 2019-10-29 DIAGNOSIS — J45909 Unspecified asthma, uncomplicated: Secondary | ICD-10-CM | POA: Insufficient documentation

## 2019-10-29 DIAGNOSIS — Z833 Family history of diabetes mellitus: Secondary | ICD-10-CM | POA: Insufficient documentation

## 2019-10-29 DIAGNOSIS — E038 Other specified hypothyroidism: Secondary | ICD-10-CM

## 2019-10-29 DIAGNOSIS — Z9221 Personal history of antineoplastic chemotherapy: Secondary | ICD-10-CM | POA: Insufficient documentation

## 2019-10-29 DIAGNOSIS — Z803 Family history of malignant neoplasm of breast: Secondary | ICD-10-CM | POA: Insufficient documentation

## 2019-10-29 DIAGNOSIS — J4541 Moderate persistent asthma with (acute) exacerbation: Secondary | ICD-10-CM

## 2019-10-29 DIAGNOSIS — C50412 Malignant neoplasm of upper-outer quadrant of left female breast: Secondary | ICD-10-CM | POA: Diagnosis not present

## 2019-10-29 DIAGNOSIS — Z801 Family history of malignant neoplasm of trachea, bronchus and lung: Secondary | ICD-10-CM | POA: Insufficient documentation

## 2019-10-29 DIAGNOSIS — Z8249 Family history of ischemic heart disease and other diseases of the circulatory system: Secondary | ICD-10-CM | POA: Insufficient documentation

## 2019-10-29 DIAGNOSIS — Z923 Personal history of irradiation: Secondary | ICD-10-CM | POA: Diagnosis not present

## 2019-10-29 DIAGNOSIS — Z170 Estrogen receptor positive status [ER+]: Secondary | ICD-10-CM | POA: Diagnosis not present

## 2019-10-29 DIAGNOSIS — Z8042 Family history of malignant neoplasm of prostate: Secondary | ICD-10-CM | POA: Diagnosis not present

## 2019-10-29 DIAGNOSIS — Z8349 Family history of other endocrine, nutritional and metabolic diseases: Secondary | ICD-10-CM | POA: Diagnosis not present

## 2019-10-29 DIAGNOSIS — J4540 Moderate persistent asthma, uncomplicated: Secondary | ICD-10-CM

## 2019-10-29 LAB — COMPREHENSIVE METABOLIC PANEL
ALT: 12 U/L (ref 0–44)
AST: 10 U/L — ABNORMAL LOW (ref 15–41)
Albumin: 3.5 g/dL (ref 3.5–5.0)
Alkaline Phosphatase: 64 U/L (ref 38–126)
Anion gap: 3 — ABNORMAL LOW (ref 5–15)
BUN: 11 mg/dL (ref 6–20)
CO2: 30 mmol/L (ref 22–32)
Calcium: 9.2 mg/dL (ref 8.9–10.3)
Chloride: 106 mmol/L (ref 98–111)
Creatinine, Ser: 0.88 mg/dL (ref 0.44–1.00)
GFR, Estimated: >60 mL/min (ref >60)
Glucose, Bld: 122 mg/dL — ABNORMAL HIGH (ref 70–99)
Potassium: 3.5 mmol/L (ref 3.5–5.1)
Sodium: 139 mmol/L (ref 135–145)
Total Bilirubin: 0.2 mg/dL — ABNORMAL LOW (ref 0.3–1.2)
Total Protein: 7.1 g/dL (ref 6.5–8.1)

## 2019-10-29 LAB — CBC WITH DIFFERENTIAL/PLATELET
Abs Immature Granulocytes: 0.04 10*3/uL (ref 0.00–0.07)
Basophils Absolute: 0.1 10*3/uL (ref 0.0–0.1)
Basophils Relative: 1 %
Eosinophils Absolute: 0.3 10*3/uL (ref 0.0–0.5)
Eosinophils Relative: 4 %
HCT: 36.3 % (ref 36.0–46.0)
Hemoglobin: 12.1 g/dL (ref 12.0–15.0)
Immature Granulocytes: 1 %
Lymphocytes Relative: 25 %
Lymphs Abs: 2 10*3/uL (ref 0.7–4.0)
MCH: 26.5 pg (ref 26.0–34.0)
MCHC: 33.3 g/dL (ref 30.0–36.0)
MCV: 79.4 fL — ABNORMAL LOW (ref 80.0–100.0)
Monocytes Absolute: 0.8 10*3/uL (ref 0.1–1.0)
Monocytes Relative: 11 %
Neutro Abs: 4.6 10*3/uL (ref 1.7–7.7)
Neutrophils Relative %: 58 %
Platelets: 248 10*3/uL (ref 150–400)
RBC: 4.57 MIL/uL (ref 3.87–5.11)
RDW: 13.7 % (ref 11.5–15.5)
WBC: 7.9 10*3/uL (ref 4.0–10.5)
nRBC: 0 % (ref 0.0–0.2)

## 2019-10-29 LAB — IRON AND TIBC
Iron: 54 ug/dL (ref 41–142)
Saturation Ratios: 19 % — ABNORMAL LOW (ref 21–57)
TIBC: 285 ug/dL (ref 236–444)
UIBC: 231 ug/dL (ref 120–384)

## 2019-10-29 LAB — RETICULOCYTES
Immature Retic Fract: 7.1 % (ref 2.3–15.9)
RBC.: 4.6 MIL/uL (ref 3.87–5.11)
Retic Count, Absolute: 53.8 10*3/uL (ref 19.0–186.0)
Retic Ct Pct: 1.2 % (ref 0.4–3.1)

## 2019-10-29 LAB — FERRITIN: Ferritin: 103 ng/mL (ref 11–307)

## 2019-10-29 MED ORDER — MONTELUKAST SODIUM 10 MG PO TABS: ORAL_TABLET | ORAL | 0 refills | Status: DC

## 2019-10-29 MED ORDER — LEVOCETIRIZINE DIHYDROCHLORIDE 5 MG PO TABS
5.0000 mg | ORAL_TABLET | Freq: Every evening | ORAL | 0 refills | Status: DC
Start: 2019-10-29 — End: 2019-11-26

## 2019-10-29 NOTE — Telephone Encounter (Signed)
Patient aware of prescriptions being sent in.

## 2019-10-29 NOTE — Telephone Encounter (Signed)
Prescriptions have been sent to requested pharmacy. I did call patient to notify but she was unable to hear me I believe.

## 2019-10-29 NOTE — Telephone Encounter (Signed)
patien came in and made appointment for 12/16 and needs to have xyzal and singulair called into [facility] [address] [phone].

## 2019-10-30 ENCOUNTER — Inpatient Hospital Stay: Payer: BC Managed Care – PPO

## 2019-10-31 LAB — HGB SOLUBILITY: Hgb Solubility: POSITIVE — AB

## 2019-10-31 LAB — HGB FRACTIONATION CASCADE
Hgb A2: 2.8 % (ref 1.8–3.2)
Hgb A: 56.6 % — ABNORMAL LOW (ref 96.4–98.8)
Hgb F: 0 % (ref 0.0–2.0)
Hgb S: 40.6 % — ABNORMAL HIGH

## 2019-11-05 NOTE — Progress Notes (Signed)
[place]  [phone] [phone]     ID: Veronica Reilly DOB: [birth year]  MR#: [id number]  [facility]#:[id number]  Patient Care Team: Leeroy Cha, MD as PCP - General (Internal Medicine) Ikechukwu Cerny, Virgie Dad, MD as Consulting Physician (Oncology) Kyung Rudd, MD as Consulting Physician (Radiation Oncology) Erroll Luna, MD as Consulting Physician (General Surgery) Delsa Bern, MD as Consulting Physician (Obstetrics and Gynecology) Kennith Gain, MD as Consulting Physician (Allergy) Delrae Rend, MD as Consulting Physician (Endocrinology) Juanita Craver, MD as Consulting Physician (Gastroenterology) Chauncey Cruel, MD OTHER MD:  CHIEF COMPLAINT: estrogen receptor positive breast cancer  CURRENT TREATMENT: tamoxifen   INTERVAL HISTORY: Audrea Muscat" returns today for follow up of her estrogen receptor positive breast cancer.  She continues on tamoxifen.  She does have significant problems with hot flashes and she wonders if there is anything we can do to help with that.  Vaginal wetness is not an issue.  Since her last visit, she underwent bilateral diagnostic mammography with tomography at [facility] on 10/26/2019 showing: breast density category C; expected postoperative changes in left breast; 2 groups of indeterminate calcifications in right breast. She is scheduled for biopsy of the two right breast areas tomorrow, 11/07/2019.   REVIEW OF SYSTEMS: Veronica "Lelon Frohlich" is under a great deal of stress currently.  This is true not only because of her own diagnosis but because of her husband who has type 1 diabetes and is at risk of kidney failure.  They are both working hard on this, with a very good diet and and improving exercise program.  They walk at least 20 minutes daily and sometimes she does it more than once a day.  She did get some leg cramps the other night and we discussed that at length.  A detailed review of systems  was otherwise stable.     HISTORY OF CURRENT ILLNESS: From the original intake note:  Veronica Reilly had some calcifications in the left breast noted March 2019 on screening mammography.. She underwent biopsy of the left retroareolar area of concern on 03/18/2017 with pathology ([id number]) showing fibrocystic changes with no malignancy. Return in 6 months for left diagnostic mammography and ultrasonography was recommended, and she reports this took place in November 2019 at her gynecologist's office and the changes previously noted were stable.  She underwent bilateral diagnostic mammography with tomography and left breast ultrasonography at [facility] on 06/27/2018 showing: breast density category C; left breast upper-outer quadrant 3.1 cm group of indeterminate calcifications; persistent benign-appearing cyst in the subareolar left breast, post core needle biopsy changes.  On physical exam there were no palpable masses.  Targeted ultrasound revealed no suspicious masses.  There was a left subareolar breast cyst measuring 1.0 cm.  Left axilla was unremarkable.  Accordingly on 06/29/2018 she proceeded to biopsy of the left breast calcifications area in question. The pathology from this procedure ([id number]) showed: invasive ductal carcinoma, grade 2, with ductal carcinoma in situ. Prognostic indicators significant for: estrogen receptor, 100% positive with strong staining intensity and progesterone receptor, 0% negative. Proliferation marker Ki67 at 52% HER2 negtive by immunohistochemistry, (0).  The patient's subsequent history is as detailed below.   PAST MEDICAL HISTORY: Past Medical History:  Diagnosis Date  . ASCUS (atypical squamous cells of undetermined significance) on Pap smear   . Asthma    controlled with daily inhaler  . Breast cancer ([place]) 06/29/2018   Left IDC  . Dysplasia of cervix, low grade (CIN  1)   . Eczema   . Family history of breast cancer   . Family history of  lung cancer   . Family history of prostate cancer   . GERD (gastroesophageal reflux disease)   . Heart murmur   . History of chicken pox   . Hypertension   . Hypothyroid   . Personal history of chemotherapy   . Personal history of radiation therapy   . Uterine fibroid   Heart murmur, evaluated by cardiologist   PAST SURGICAL HISTORY: Past Surgical History:  Procedure Laterality Date  . ADENOIDECTOMY    . BREAST LUMPECTOMY Left 08/22/2018  . BREAST LUMPECTOMY WITH RADIOACTIVE SEED AND SENTINEL LYMPH NODE BIOPSY Left 08/22/2018   Procedure: LEFT BREAST LUMPECTOMY WITH RADIOACTIVE SEED AND SENTINEL LYMPH NODE MAPPING;  Surgeon: Erroll Luna, MD;  Location: [facility];  Service: General;  Laterality: Left;  . LEEP    . MYOMECTOMY    . PORT-A-CATH REMOVAL N/A 03/13/2019   Procedure: PORT REMOVAL;  Surgeon: Erroll Luna, MD;  Location: [facility];  Service: General;  Laterality: N/A;  . PORTACATH PLACEMENT Right 09/27/2018   Procedure: INSERTION PORT-A-CATH WITH ULTRASOUND;  Surgeon: Erroll Luna, MD;  Location: [facility];  Service: General;  Laterality: Right;  . TONSILECTOMY, ADENOIDECTOMY, BILATERAL MYRINGOTOMY AND TUBES    . TONSILLECTOMY      FAMILY HISTORY: Family History  Problem Relation Age of Onset  . Breast cancer Maternal Grandmother   . Heart disease Father   . Diabetes Father   . Prostate cancer Father 76  . Breast cancer Mother 46  . Hypertension Other   . Lung cancer Maternal Aunt   . Allergic rhinitis Neg Hx   . Angioedema Neg Hx   . Asthma Neg Hx   . Eczema Neg Hx   . Immunodeficiency Neg Hx   . Urticaria Neg Hx    Patient's father was 32 years old when he died from diabetes and renal failure. Patient's mother died from breast cancer at age 30. The patient notes a family hx of breast and possibly ovarian cancer. Her mother was diagnosed with inflammatory breast cancer at around age 35 (she was a patient here), and the patient  maternal grandmother was also diagnosed with breast cancer and died at age 42.  The patient has 1 brother, no sisters. She also had one maternal aunt with lung cancer and a history of smoking, her father with prostate cancer at age 16, and a possible GYN cancer in a paternal aunt.   GYNECOLOGIC HISTORY:  Last menstrual period February 2020  menarche: 54 years old GX P 0, she has had fertility treatments LMP 05/2018, lasted 3 days, irregular (s/p ablation) Contraceptive: used for apprx. 6 months HRT never used  Hysterectomy? no BSO? 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 Husband Celesta Gentile is a trauma nurse at [employer]. She lives at home with her husband and some hermit crabs and salt and fresh water fish. 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    ADVANCED DIRECTIVES: Husband Celesta Gentile is her HCPOA.   HEALTH MAINTENANCE: Social History   Tobacco Use  . Smoking status: Never Smoker  . Smokeless tobacco: Never Used  Substance Use Topics  .  Alcohol use: No  . Drug use: No     Colonoscopy: Pending  PAP: Up-to-date  Bone density: never done   Allergies  Allergen Reactions  . Apple Anaphylaxis  . Iohexol     Contrast dye  . Other Shortness Of Breath    CHERRIES, TREES, MOLD,DUST, COCKROACHES  . Povidone-Iodine Anaphylaxis  . Shellfish Allergy Anaphylaxis  . Peach [Prunus Persica] Itching and Hives    mouth  . Zocor [Simvastatin] Other (See Comments)    Alopecia  . Betadine [Povidone Iodine]     Due to shellfish allergy  . Latex Rash    Current Outpatient Medications  Medication Sig Dispense Refill  . albuterol (VENTOLIN HFA) 108 (90 Base) MCG/ACT inhaler Use 2 puffs every four hours as needed for cough or wheeze.   May use 2 puffs 10-20 minutes prior to exercise. 18 g 1  . budesonide-formoterol (SYMBICORT) 160-4.5 MCG/ACT inhaler INHALE 2 PUFFS INTO THE LUNGS 2 (TWO) TIMES DAILY. 10.2 g 5  . Cholecalciferol (VITAMIN D3) 50 MCG (2000 UT) TABS Take 2,000 Units by mouth every evening.    Marland Kitchen EPINEPHrine (EPIPEN 2-PAK) 0.3 mg/0.3 mL IJ SOAJ injection Inject 0.3 mLs (0.3 mg total) into the muscle as needed for anaphylaxis. Use as directed for a threatening allergic reaction 2 each 1  . esomeprazole (NEXIUM 24HR) 20 MG capsule Take 20 mg by mouth daily at 12 noon.    . folic acid (FOLVITE) 1 MG tablet Take 1 mg by mouth every evening.     . hydrochlorothiazide (MICROZIDE) 12.5 MG capsule Take 12.5 mg by mouth daily.   1  . levocetirizine (XYZAL) 5 MG tablet Take 1 tablet (5 mg total) by mouth every evening. 30 tablet 0  . levothyroxine (SYNTHROID, LEVOTHROID) 112 MCG tablet Take 112 mcg by mouth daily before breakfast.     . montelukast (SINGULAIR) 10 MG tablet Pt needs an appt before getting more refills. 30 tablet 0  . NIFEdipine (PROCARDIA XL/ADALAT-CC) 60 MG 24 hr tablet Take 60 mg by mouth daily.  1  . Olopatadine HCl (PAZEO) 0.7 % SOLN Apply 1 drop to eye daily. 2.5 mL 5  . tamoxifen (NOLVADEX) 20 MG tablet Take 20 mg by mouth daily.    Marland Kitchen triamcinolone (NASACORT ALLERGY 24HR) 55 MCG/ACT AERO nasal inhaler Place 2 sprays into the nose daily.     No current facility-administered medications for this visit.    OBJECTIVE: African-American woman who appears stated age  54:   11/06/19 1516  BP: 116/77  Pulse: 80  Resp: 17  Temp: (!) 97 F (36.1 C)  SpO2: 100%     Body mass index is 35.86 kg/m.   Wt Readings from Last 3 Encounters:  11/06/19 222 lb 3.2 oz (100.8 kg)  05/03/19 218 lb 4.8 oz (99 kg)  03/13/19 220 lb 3.8 oz (99.9 kg)  ECOG FS:1 - Symptomatic but completely ambulatory  Sclerae unicteric, EOMs intact Wearing a mask No cervical or supraclavicular adenopathy Lungs no rales or  rhonchi Heart regular rate and rhythm Abd soft, nontender, positive bowel sounds MSK no focal spinal tenderness, no upper extremity lymphedema Neuro: nonfocal, well oriented, appropriate affect Breasts: The right breast is benign and I do not palpate any masses.  There are no skin changes or nipple changes of concern.  The left breast is status post lumpectomy and radiation.  There is the expected hyperpigmentation and some skin coarsening but no evidence of disease recurrence.  Both axillae are benign.  LAB RESULTS:  CMP     Component Value Date/Time   NA 139 10/29/2019 1353   K 3.5 10/29/2019 1353   CL 106 10/29/2019 1353   CO2 30 10/29/2019 1353   GLUCOSE 122 (H) 10/29/2019 1353   BUN 11 10/29/2019 1353   CREATININE 0.88 10/29/2019 1353   CREATININE 0.81 05/03/2019 1020   CALCIUM 9.2 10/29/2019 1353   PROT 7.1 10/29/2019 1353   ALBUMIN 3.5 10/29/2019 1353   AST 10 (L) 10/29/2019 1353   AST 10 (L) 05/03/2019 1020   ALT 12 10/29/2019 1353   ALT 8 05/03/2019 1020   ALKPHOS 64 10/29/2019 1353   BILITOT 0.2 (L) 10/29/2019 1353   BILITOT 0.5 05/03/2019 1020   GFRNONAA >60 10/29/2019 1353   GFRNONAA >60 05/03/2019 1020   GFRAA >60 05/03/2019 1020    No results found for: TOTALPROTELP, ALBUMINELP, A1GS, A2GS, BETS, BETA2SER, GAMS, MSPIKE, SPEI  No results found for: KPAFRELGTCHN, LAMBDASER, KAPLAMBRATIO  Lab Results  Component Value Date   WBC 7.9 10/29/2019   NEUTROABS 4.6 10/29/2019   HGB 12.1 10/29/2019   HCT 36.3 10/29/2019   MCV 79.4 (L) 10/29/2019   PLT 248 10/29/2019   No results found for: LABCA2  No components found for: RKYHCW237  No results for input(s): INR in the last 168 hours.  No results found for: LABCA2  No results found for: SEG315  No results found for: VVO160  No results found for: VPX106  No results found for: CA2729  No components found for: HGQUANT  No results found for: CEA1 / No results found for: CEA1   No results found for:  AFPTUMOR  No results found for: CHROMOGRNA  No results found for: HGBA, HGBA2QUANT, HGBFQUANT, HGBSQUAN (Hemoglobinopathy evaluation)   No results found for: LDH  Lab Results  Component Value Date   IRON 54 10/29/2019   TIBC 285 10/29/2019   IRONPCTSAT 19 (L) 10/29/2019   (Iron and TIBC)  Lab Results  Component Value Date   FERRITIN 103 10/29/2019    Urinalysis    Component Value Date/Time   COLORURINE YELLOW 05/15/2007 0957   APPEARANCEUR CLEAR 05/15/2007 0957   LABSPEC 1.010 05/15/2007 0957   PHURINE 8.0 05/15/2007 0957   GLUCOSEU NEGATIVE 05/15/2007 0957   HGBUR NEGATIVE 05/15/2007 0957   BILIRUBINUR NEGATIVE 05/15/2007 0957   KETONESUR NEGATIVE 05/15/2007 0957   PROTEINUR NEGATIVE 05/15/2007 0957   UROBILINOGEN 0.2 05/15/2007 0957   NITRITE NEGATIVE 05/15/2007 0957   LEUKOCYTESUR  05/15/2007 0957    NEGATIVE MICROSCOPIC NOT DONE ON URINES WITH NEGATIVE PROTEIN, BLOOD, LEUKOCYTES, NITRITE, OR GLUCOSE <1000 mg/dL.    STUDIES: MM DIAG BREAST TOMO BILATERAL  Result Date: 10/26/2019 CLINICAL DATA:  Status post LEFT lumpectomy in August 2020. Patient is treated with radiation and chemotherapy. EXAM: DIGITAL DIAGNOSTIC BILATERAL MAMMOGRAM WITH TOMO AND CAD COMPARISON:  06/27/2018 ACR Breast Density Category c: The breast tissue is heterogeneously dense, which may obscure small masses. FINDINGS: Post operative changes are seen in the LEFTbreast. Calcifications in the MEDIAL portion of the RIGHT breast are further evaluated on magnified views. These views demonstrate 2 groups of calcifications. The first group is in the [place] of the RIGHT breast. These calcifications span 1.6 x 0.7 x 0.9 centimeters. The second group of calcifications is identified in the [place] of the RIGHT breast scanning 1.2 x 1.0 x 0.9 centimeters. Mammographic images were processed with CAD. IMPRESSION: 1. Expected postoperative changes in the LEFT breast. 2. 2 groups  of  indeterminate calcifications in the RIGHT breast warranting tissue diagnosis. RECOMMENDATION: Recommend stereotactic guided core biopsy of 2 sites of calcifications in the RIGHT breast. I have discussed the findings and recommendations with the patient and her husband by speaker phone. If applicable, a reminder letter will be sent to the patient regarding the next appointment. BI-RADS CATEGORY  4: Suspicious. Electronically Signed   By: Nolon Nations M.D.   On: 10/26/2019 17:03     ELIGIBLE FOR AVAILABLE RESEARCH PROTOCOL: no  Hemoglobin electrophoresis 10/29/2019 is consistent with sickle cell trait (56.6% hemoglobin A, 2.8% hemoglobin A 2, 40.6% hemoglobin S, 0% hemoglobin F).  (a) ferritin 103 with saturation 19% and MCV 79.4 on 10/29/2019.  ASSESSMENT: 54 y.o.  [place] status post left breast upper outer quadrant biopsy 06/29/2018 for a clinical T2N0 invasive ductal carcinoma, grade 2, estrogen receptor positive, progesterone receptor negative, with an MIB-1-1 of 10%, and no HER-2 amplification.  (1) genetics 07/18/2018 through the [facility] Common Hereditary Cancers Panel found no deleterious mutations in APC, ATM, AXIN2, BARD1, BMPR1A, BRCA1, BRCA2, BRIP1, CDH1, CDKN2A (p14ARF), CDKN2A (p16INK4a), CKD4, CHEK2, CTNNA1, DICER1, EPCAM (Deletion/duplication testing only), GREM1 (promoter region deletion/duplication testing only), KIT, MEN1, MLH1, MSH2, MSH3, MSH6, MUTYH, NBN, NF1, NHTL1, PALB2, PDGFRA, PMS2, POLD1, POLE, PTEN, RAD50, RAD51C, RAD51D, SDHB, SDHC, SDHD, SMAD4, SMARCA4. STK11, TP53, TSC1, TSC2, and VHL.  The following genes were evaluated for sequence changes only: SDHA and HOXB13 c.251G>A variant only.   (2) status post left lumpectomy and sentinel lymph node sampling 08/22/2018 for a pT2 pN0, stage IB invasive ductal carcinoma, grade 2, with negative margins  (a) a total of 10 axillary lymph nodes were removed  (3) Oncotype score of 34 predicts a risk of recurrence outside the  breast in the next 9 years of 22% if the patient's only systemic therapy is antiestrogens for 5 years.  It also predicts significant benefit from chemotherapy.  (4) adjuvant chemotherapy consisting of cyclophosphamide and docetaxel given every 21 days x 4 from 09/29/2018 through 11/30/2018  (5) adjuvant radiation 12/26/18 - 01/24/19 Site/dose:   The patient initially received a dose of 42.56 Gy in 16 fractions to the breast using whole-breast tangent fields. This was delivered using a 3-D conformal technique. The patient then received a boost to the seroma. This delivered an additional 8 Gy in 36factions using a 3 field photon technique due to the depth of the seroma. The total dose was 50.56 Gy.  (6) tamoxifen started 01/24/2018     PLAN: MGaynel Schaafsmais now a little over a year out from definitive surgery for her breast cancer with no evidence of disease recurrence.  This is very favorable.  She is tolerating tamoxifen generally well although the hot flashes are real issue.  There is also quite a bit of stress going on right now as described above.  I think she would benefit from venlafaxine.  She is going to start at 37.5 mg daily for 2 weeks and then up the dose to 75 mg daily.  When she runs out and [place] she will [place] if she wants to stay on the 37.5 or continue on the 75 mg a day.  For cramps in addition to hydration she needs to make sure she has plenty of potassium and if that does not take care of the problem she can add magnesium 400 mg at bedtime so as it does not affect bowel movement frequency  Of course she is ready  for her biopsies tomorrow.  Hopefully those will be benign.  If not she will return to see me for definitive treatment plan.  Assuming they are benign she will see me again in 6 months  Total encounter time 25 minutes.Sarajane Jews C. Leonora Gores, MD Medical Oncology and Hematology [facility] [place], [place] [address] Tel.  [phone]    Fax. [phone]   I, Wilburn Mylar, am acting as scribe for Dr. Virgie Dad. Justo Hengel.  I, Lurline Del MD, have reviewed the above documentation for accuracy and completeness, and I agree with the above.   *Total Encounter Time as defined by the Centers for Medicare and Medicaid Services includes, in addition to the face-to-face time of a patient visit (documented in the note above) non-face-to-face time: obtaining and reviewing outside history, ordering and reviewing medications, tests or procedures, care coordination (communications with other health care professionals or caregivers) and documentation in the medical record.

## 2019-11-06 ENCOUNTER — Other Ambulatory Visit: Payer: Self-pay

## 2019-11-06 ENCOUNTER — Inpatient Hospital Stay (HOSPITAL_BASED_OUTPATIENT_CLINIC_OR_DEPARTMENT_OTHER): Payer: BC Managed Care – PPO | Admitting: Oncology

## 2019-11-06 VITALS — BP 116/77 | HR 80 | Temp 97.0°F | Resp 17 | Ht 66.0 in | Wt 222.2 lb

## 2019-11-06 DIAGNOSIS — Z170 Estrogen receptor positive status [ER+]: Secondary | ICD-10-CM | POA: Diagnosis not present

## 2019-11-06 DIAGNOSIS — C50412 Malignant neoplasm of upper-outer quadrant of left female breast: Secondary | ICD-10-CM | POA: Diagnosis not present

## 2019-11-06 MED ORDER — VENLAFAXINE HCL ER 37.5 MG PO CP24: 37.5000 mg | ORAL_CAPSULE | Freq: Every day | ORAL | 0 refills | Status: DC

## 2019-11-07 ENCOUNTER — Ambulatory Visit
Admission: RE | Admit: 2019-11-07 | Discharge: 2019-11-07 | Disposition: A | Discharge status: Home / Self Care | Payer: BC Managed Care – PPO | Source: Ambulatory Visit | Attending: Oncology | Admitting: Oncology

## 2019-11-07 DIAGNOSIS — R921 Mammographic calcification found on diagnostic imaging of breast: Secondary | ICD-10-CM

## 2019-11-24 ENCOUNTER — Encounter: Payer: Self-pay | Admitting: Oncology

## 2019-11-24 ENCOUNTER — Other Ambulatory Visit: Payer: Self-pay | Admitting: Allergy

## 2019-12-09 ENCOUNTER — Emergency Department (HOSPITAL_COMMUNITY)
Admission: EM | Admit: 2019-12-09 | Discharge: 2019-12-09 | Disposition: A | Discharge status: Home / Self Care | Payer: BC Managed Care – PPO | Attending: Emergency Medicine | Admitting: Emergency Medicine

## 2019-12-09 ENCOUNTER — Other Ambulatory Visit: Payer: Self-pay

## 2019-12-09 ENCOUNTER — Encounter (HOSPITAL_COMMUNITY): Payer: Self-pay | Admitting: Emergency Medicine

## 2019-12-09 DIAGNOSIS — I10 Essential (primary) hypertension: Secondary | ICD-10-CM | POA: Insufficient documentation

## 2019-12-09 DIAGNOSIS — E039 Hypothyroidism, unspecified: Secondary | ICD-10-CM | POA: Insufficient documentation

## 2019-12-09 DIAGNOSIS — Z79899 Other long term (current) drug therapy: Secondary | ICD-10-CM | POA: Insufficient documentation

## 2019-12-09 DIAGNOSIS — L299 Pruritus, unspecified: Secondary | ICD-10-CM | POA: Diagnosis present

## 2019-12-09 DIAGNOSIS — L509 Urticaria, unspecified: Secondary | ICD-10-CM | POA: Diagnosis not present

## 2019-12-09 DIAGNOSIS — L52 Erythema nodosum: Secondary | ICD-10-CM | POA: Diagnosis not present

## 2019-12-09 DIAGNOSIS — Z853 Personal history of malignant neoplasm of breast: Secondary | ICD-10-CM | POA: Insufficient documentation

## 2019-12-09 MED ORDER — FAMOTIDINE 20 MG PO TABS
20.0000 mg | ORAL_TABLET | Freq: Once | ORAL | Status: AC
  Administered 2019-12-09: 20 mg via ORAL
  Filled 2019-12-09: qty 1

## 2019-12-09 MED ORDER — HYDROXYZINE HCL 25 MG PO TABS
25.0000 mg | ORAL_TABLET | Freq: Once | ORAL | Status: AC
  Administered 2019-12-09: 25 mg via ORAL
  Filled 2019-12-09: qty 1

## 2019-12-09 MED ORDER — PREDNISONE 10 MG PO TABS
ORAL_TABLET | ORAL | 0 refills | Status: AC
Start: 2019-12-09 — End: 2019-12-14

## 2019-12-09 MED ORDER — PREDNISONE 20 MG PO TABS
60.0000 mg | ORAL_TABLET | Freq: Once | ORAL | Status: AC
  Administered 2019-12-09: 60 mg via ORAL
  Filled 2019-12-09: qty 3

## 2019-12-09 MED ORDER — EPINEPHRINE 0.3 MG/0.3ML IJ SOAJ: 0.3000 mg | INTRAMUSCULAR | 0 refills | Status: DC | PRN

## 2019-12-09 NOTE — ED Provider Notes (Signed)
[facility] DEPT Provider Note   CSN: [id number] Arrival date & time: 12/09/19  0932     History Chief Complaint  Patient presents with  . Pruritis    Veronica Reilly is a 54 y.o. female with a history of asthma, allergies, HTN, and breast cancer status post chemotherapy and radiation who presents the emergency department with a chief complaint of rash.  The patient reports that she was seen by her oncologist on 11 5 and was started on Effexor for hot flashes.  She discontinued the medication on November 12 after she began developing a pruritic rash.  She reports that the rash has been coming and going since onset.  It is pruritic and painful.  She has noticed it on her bilateral arms, palms, soles, buttocks, and chest since onset.  Symptoms have been well controlled with Benadryl and Pepcid.  However, tonight she was unable to sleep due to the itching and discomfort with the rash.  She notes that she has had a lesion on her right palm, sole of the left foot, chest wall, left buttock, and flexor surface of the right elbow.  She took Benadryl earlier tonight with no improvement.  No recent illness.  She denies fever, chills, shortness of breath, chest pain, lip or tongue swelling, throat closing, nausea, vomiting, diarrhea, abdominal pain, dysuria, headaches, neck pain or stiffness.  No recent hiking.  No concern for tick bites.  No new soaps, lotions, detergents, sleeping environments.  No other medications aside from Effexor.  She did undergo radiation for breast cancer, but has not had radiation since January.  She does have a significant history of many allergies and is followed by an allergy specialist.  The history is provided by the patient and medical records. No language interpreter was used.       Past Medical History:  Diagnosis Date  . ASCUS (atypical squamous cells of undetermined significance) on Pap smear   . Asthma    controlled with daily  inhaler  . Breast cancer ([place]) 06/29/2018   Left IDC  . Dysplasia of cervix, low grade (CIN 1)   . Eczema   . Family history of breast cancer   . Family history of lung cancer   . Family history of prostate cancer   . GERD (gastroesophageal reflux disease)   . Heart murmur   . History of chicken pox   . Hypertension   . Hypothyroid   . Personal history of chemotherapy   . Personal history of radiation therapy   . Uterine fibroid     Patient Active Problem List   Diagnosis Date Noted  . Port-A-Cath in place 09/29/2018  . Genetic testing 07/19/2018  . Family history of breast cancer   . Family history of prostate cancer   . Family history of lung cancer   . Malignant neoplasm of upper-outer quadrant of left breast in female, estrogen receptor positive ([place]) 07/05/2018  . Moderate persistent asthma with (acute) exacerbation 03/30/2018  . Seasonal and perennial allergic rhinoconjunctivitis 03/30/2018  . 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 Hypothyroidism 02/09/2012  . Fibroids 02/09/2012  . Dysplasia of cervix, low grade (CIN 1) 09/21/2011    Past Surgical History:  Procedure Laterality Date  . ADENOIDECTOMY    . BREAST LUMPECTOMY Left 08/22/2018  . BREAST LUMPECTOMY WITH RADIOACTIVE SEED AND SENTINEL LYMPH NODE BIOPSY Left 08/22/2018   Procedure: LEFT BREAST LUMPECTOMY WITH RADIOACTIVE SEED AND SENTINEL LYMPH NODE MAPPING;  Surgeon: Erroll Luna,  MD;  Location: [facility];  Service: General;  Laterality: Left;  . LEEP    . MYOMECTOMY    . PORT-A-CATH REMOVAL N/A 03/13/2019   Procedure: PORT REMOVAL;  Surgeon: Erroll Luna, MD;  Location: [facility];  Service: General;  Laterality: N/A;  . PORTACATH PLACEMENT Right 09/27/2018   Procedure: INSERTION PORT-A-CATH WITH ULTRASOUND;  Surgeon: Erroll Luna, MD;  Location: [facility];  Service: General;  Laterality: Right;  . TONSILECTOMY, ADENOIDECTOMY, BILATERAL MYRINGOTOMY AND TUBES    .  TONSILLECTOMY       OB History    Gravida  0   Para  0   Term  0   Preterm  0   AB  0   Living  0     SAB  0   TAB  0   Ectopic  0   Multiple  0   Live Births              Family History  Problem Relation Age of Onset  . Breast cancer Maternal Grandmother   . Heart disease Father   . Diabetes Father   . Prostate cancer Father 11  . Breast cancer Mother 69  . Hypertension Other   . Lung cancer Maternal Aunt   . Allergic rhinitis Neg Hx   . Angioedema Neg Hx   . Asthma Neg Hx   . Eczema Neg Hx   . Immunodeficiency Neg Hx   . Urticaria Neg Hx     Social History   Tobacco Use  . Smoking status: Never Smoker  . Smokeless tobacco: Never Used  Substance Use Topics  . Alcohol use: No  . Drug use: No    Home Medications Prior to Admission medications   Medication Sig Start Date End Date Taking? Authorizing Provider  albuterol (VENTOLIN HFA) 108 (90 Base) MCG/ACT inhaler Use 2 puffs every four hours as needed for cough or wheeze.  May use 2 puffs 10-20 minutes prior to exercise. 02/02/19   Kennith Gain, MD  budesonide-formoterol (SYMBICORT) 160-4.5 MCG/ACT inhaler INHALE 2 PUFFS INTO THE LUNGS 2 (TWO) TIMES DAILY. 02/02/19   Kennith Gain, MD  Cholecalciferol (VITAMIN D3) 50 MCG (2000 UT) TABS Take 2,000 Units by mouth every evening.    [provider]  EPINEPHrine 0.3 mg/0.3 mL IJ SOAJ injection Inject 0.3 mg into the muscle as needed for anaphylaxis. 12/09/19   Alicia Seib A, PA-C  esomeprazole (NEXIUM 24HR) 20 MG capsule Take 20 mg by mouth daily at 12 noon.    [provider]  folic acid (FOLVITE) 1 MG tablet Take 1 mg by mouth every evening.     [provider]  hydrochlorothiazide (MICROZIDE) 12.5 MG capsule Take 12.5 mg by mouth daily.  07/02/16   [provider]  levocetirizine (XYZAL) 5 MG tablet TAKE 1 TABLET(5 MG) BY MOUTH EVERY EVENING 11/26/19   Padgett, Rae Halsted, MD    levothyroxine (SYNTHROID, LEVOTHROID) 112 MCG tablet Take 112 mcg by mouth daily before breakfast.  04/26/17   [provider]  montelukast (SINGULAIR) 10 MG tablet Pt needs an appt before getting more refills. 10/29/19   Kennith Gain, MD  NIFEdipine (PROCARDIA XL/ADALAT-CC) 60 MG 24 hr tablet Take 60 mg by mouth daily. 07/02/16   [provider]  Olopatadine HCl (PAZEO) 0.7 % SOLN Apply 1 drop to eye daily. 02/02/19   Kennith Gain, MD  predniSONE (DELTASONE) 10 MG tablet Take 5 tablets (50  mg total) by mouth daily for 1 day, THEN 4 tablets (40 mg total) daily for 1 day, THEN 3 tablets (30 mg total) daily for 1 day, THEN 2 tablets (20 mg total) daily for 1 day, THEN 1 tablet (10 mg total) daily for 1 day. 12/09/19 12/14/19  Daleysa Kristiansen A, PA-C  tamoxifen (NOLVADEX) 20 MG tablet Take 20 mg by mouth daily.    [provider]  triamcinolone (NASACORT ALLERGY 24HR) 55 MCG/ACT AERO nasal inhaler Place 2 sprays into the nose daily.    [provider]  venlafaxine XR (EFFEXOR-XR) 37.5 MG 24 hr capsule Take 1 capsule (37.5 mg total) by mouth daily with breakfast. After two weeks, take 2 tablets daily by mouth with breakfast. 11/06/19   Magrinat, Virgie Dad, MD    Allergies    Apple, Iohexol, Other, Povidone-iodine, Shellfish allergy, Peach [prunus persica], Zocor [simvastatin], Betadine [povidone iodine], and Latex  Review of Systems   Review of Systems  Constitutional: Negative for activity change, chills, diaphoresis and fever.  HENT: Negative for congestion and sore throat.   Respiratory: Negative for cough, chest tightness, shortness of breath and wheezing.   Cardiovascular: Negative for chest pain and palpitations.  Gastrointestinal: Negative for abdominal pain, blood in stool, diarrhea, nausea and vomiting.  Genitourinary: Negative for dysuria.  Musculoskeletal: Positive for myalgias. Negative for arthralgias, back pain, gait problem,  joint swelling, neck pain and neck stiffness.  Skin: Positive for rash.  Allergic/Immunologic: Negative for immunocompromised state.  Neurological: Negative for dizziness, seizures, syncope, weakness, numbness and headaches.  Psychiatric/Behavioral: Negative for confusion.    Physical Exam Updated Vital Signs BP (!) 158/100 (BP Location: Right Arm)   Pulse 88   Temp 97.9 F (36.6 C) (Oral)   Resp 18   SpO2 99%   Physical Exam Vitals and nursing note reviewed.  Constitutional:      General: She is not in acute distress.    Appearance: She is not ill-appearing, toxic-appearing or diaphoretic.  HENT:     Head: Normocephalic.  Eyes:     Conjunctiva/sclera: Conjunctivae normal.  Cardiovascular:     Rate and Rhythm: Normal rate and regular rhythm.     Pulses: Normal pulses.     Heart sounds: Normal heart sounds. No murmur heard.  No friction rub. No gallop.   Pulmonary:     Effort: Pulmonary effort is normal. No respiratory distress.     Breath sounds: No stridor. No wheezing, rhonchi or rales.  Chest:     Chest wall: No tenderness.  Abdominal:     General: There is no distension.     Palpations: Abdomen is soft. There is no mass.     Tenderness: There is no abdominal tenderness. There is no right CVA tenderness, left CVA tenderness, guarding or rebound.     Hernia: No hernia is present.  Musculoskeletal:     Cervical back: Neck supple.     Right lower leg: No edema.     Left lower leg: No edema.  Skin:    General: Skin is warm.     Coloration: Skin is not pale.     Findings: No rash.     Comments: There is a large erythematous nodule noted to the flexor surface of the right elbow, palm of the right hand, sole of the left foot, chest wall between the breast, and to the left buttock.  No satellite lesions, pustules, vesicles, desquamation, scaling.   Neurological:     Mental Status: She is  alert.  Psychiatric:        Behavior: Behavior normal.                 ED Results / Procedures / Treatments   Labs (all labs ordered are listed, but only abnormal results are displayed) Labs Reviewed - No data to display  EKG None  Radiology No results found.  Procedures Procedures (including critical care time)  Medications Ordered in ED Medications  famotidine (PEPCID) tablet 20 mg (20 mg Oral Given 12/09/19 0501)  hydrOXYzine (ATARAX/VISTARIL) tablet 25 mg (25 mg Oral Given 12/09/19 0501)  predniSONE (DELTASONE) tablet 60 mg (60 mg Oral Given 12/09/19 0608)    ED Course  I have reviewed the triage vital signs and the nursing notes.  Pertinent labs & imaging results that were available during my care of the patient were reviewed by me and considered in my medical decision making (see chart for details).    MDM Rules/Calculators/A&P                          54 year old with a history of asthma, allergies, HTN, and breast cancer status post chemotherapy and radiation who presents the emergency department with a chief complaint of rash.  Patient is intermittently had a rash for the last 9 days that has been managed with Benadryl and Pepcid.  However, she was unable to sleep tonight due to the discomfort and itching after taking Benadryl.  She is concerned she may be having an allergic reaction to Effexor, which she discontinued after developing the rash.  The patient was discussed and independently evaluated by Dr. Leonides Schanz, attending physician.  On exam, patient has multiple erythematous nodules throughout the body.  These are concerning for erythema nodosum.  However, after my initial examination she did develop a new lesion to the right posterior shoulder that does appear more consistent with urticaria.  She is having no other associated symptoms including respiratory involvement or GI involvement to suggest anaphylaxis.   Pt has a patent airway without stridor and is handling secretions without difficulty; no angioedema. No  blisters, no pustules, no warmth, no draining sinus tracts, no superficial abscesses, no bullous impetigo, no vesicles, no desquamation, no target lesions with dusky purpura or a central bulla. Not tender to touch. No concern for superimposed infection. No concern for SJS, TEN, TSS, tick borne illness, syphilis or other life-threatening condition.  Given that she has had some improvement with histamine blockers, Dr. Leonides Schanz recommend starting the patient on a short prednisone taper.  First dose given in the ER.  She plans to follow-up with her allergy specialist and primary care.  This plan has been discussed with the patient and her husband who are in agreement with the plan.  She is hemodynamically stable no acute distress.  Safe discharge home with outpatient follow-up as indicated.  Final Clinical Impression(s) / ED Diagnoses Final diagnoses:  Erythema nodosum  Urticaria    Rx / DC Orders ED Discharge Orders         Ordered    predniSONE (DELTASONE) 10 MG tablet        12/09/19 0601    EPINEPHrine 0.3 mg/0.3 mL IJ SOAJ injection  As needed        12/09/19 0601           Rayneisha Bouza A, PA-C 12/09/19 0819    Ward, Delice Bison, DO 12/09/19 2308

## 2019-12-09 NOTE — ED Triage Notes (Addendum)
Pt reports pruritis that is causing her not to be able to sleep. States that she was placed on effexor last month for hot flashes and the itching started, she stopped about 3 weeks ago, but the itching is still coming and going. She also presents with urticaria on her R arm, bottom of her feet, buttocks and chest. She has taken 2 benadryl tonight. Denies SOB. States that she has a lot of allergies.

## 2019-12-09 NOTE — Discharge Instructions (Addendum)
Thank you for allowing me to care for you today in the Emergency Department.   Your rash is suspicious for erythema nodosum.  However, some of the lesions are concerning for hives or urticaria. We are going to treat this with a short course of prednisone.  You were given the first dose today in the emergency department.  Starting tomorrow, take the tablets as prescribed for the next 5 days.  Take 650 mg of Tylenol or 600 mg of ibuprofen with food every 6 hours for pain.  You can alternate between these 2 medications every 3 hours if your pain returns.  For instance, you can take Tylenol at noon, followed by a dose of ibuprofen at 3, followed by second dose of Tylenol and 6.  You can continue to take Benadryl and Pepcid for itching.  Please follow-up with primary care with your allergy specialist.  Return to the emergency department if you develop difficulty breathing, uncontrollable vomiting, swelling of your tongue or lips, fevers, red streaking up the arms or legs, or other new, concerning symptoms.

## 2019-12-10 ENCOUNTER — Other Ambulatory Visit: Payer: Self-pay | Admitting: Allergy

## 2019-12-10 DIAGNOSIS — J4540 Moderate persistent asthma, uncomplicated: Secondary | ICD-10-CM

## 2019-12-17 ENCOUNTER — Ambulatory Visit: Payer: Self-pay | Admitting: Surgery

## 2019-12-17 DIAGNOSIS — N6489 Other specified disorders of breast: Secondary | ICD-10-CM

## 2019-12-17 NOTE — H&P (Signed)
Veronica Reilly Appointment: 12/17/2019 9:40 AM Location: [facility] Patient #: [id number] DOB: 09-27-1965 Married / Language: English / Race: Black or African American Female  History of Present Illness Veronica Moores A. Ayanni Tun MD; 12/17/2019 11:10 AM) Patient words: Patient returns for follow-up of her left breast cancer. She underwent her yearly mammogram and had 2 right breast biopsies performed. One showed a radial scar. Otherwise, she is been doing well with all complaint.   54 y.o. [place] status post left breast upper outer quadrant biopsy 06/29/2018 for a clinical T2N0 invasive ductal carcinoma, grade 2, estrogen receptor positive, progesterone receptor negative, with an MIB-1-1 of 10%, and no HER-2 amplification   Diagnosis 1. Breast, right, needle core biopsy, upper inner - FIBROADENOMATOID CHANGE WITH CALCIFICATIONS. - NO MALIGNANCY IDENTIFIED. 2. Breast, right, needle core biopsy, lower inner - FIBROADENOMATOID CHANGE WITH CALCIFICATIONS. - COMPLEX SCLEROSING LESION WITH FLORID USUAL DUCTAL HYPERPLASIA, SEE COMMENT. Microscopic Comment 2. The differential includes a hyalinized intraductal papilloma. The case was called to [facility] on 11/08/2019. Vicente Males MD Pathologist, Electronic Signature (Case signed 11/08/2019) Specimen Gross and Clinical Information Specimen Comment 1. TIF: 8:15am CIT: < 2 min, hx left br ca in 2020, calcifications 2. TIF: 8:30am CIT: < 2 min, calcifications Specimen(s) Obtained: 1. Breast, right, needle core biopsy, upper inner 2. Breast, right, needle core biopsy, lower inner Specimen Clinical.  The patient is a 54 year old female.   Allergies (Chanel Teressa Senter, CMA; 12/17/2019 9:37 AM) Zocor *ANTIHYPERLIPIDEMICS* Betadine *ANTISEPTICS & DISINFECTANTS* SHELLFISH Latex Allergies Reconciled  Medication History (Chanel Teressa Senter, CMA; 12/17/2019 9:37 AM) Loratadine (Oral) Specific strength unknown -  Active. Nasacort (55MCG/ACT Aerosol Soln, Nasal) Active. NexIUM (20MG  Capsule DR, Oral) Active. Tamoxifen Citrate (20MG  Tablet, Oral) Active. hydroCHLOROthiazide (12.5MG  Capsule, Oral) Active. Folic Acid (1MG  Tablet, Oral) Active. Levothyroxine Sodium (112MCG Tablet, Oral) Active. Singulair (10MG  Tablet, Oral) Active. NIFEdipine ER Osmotic Release (60MG  Tablet ER 24HR, Oral) Active. Vitamin D (Oral) Specific strength unknown - Active. Medications Reconciled    Vitals (Chanel Nolan CMA; 12/17/2019 9:38 AM) 12/17/2019 9:37 AM Weight: 224 lb Height: 66in Body Surface Area: 2.1 m Body Mass Index: 36.15 kg/m  Temp.: 97.32F  Pulse: 104 (Regular)  BP: 126/78(Sitting, Left Arm, Standard)        Physical Exam (Chancellor Vanderloop A. Annisa Mazzarella MD; 12/17/2019 11:10 AM)  General Mental Status-Alert. General Appearance-Consistent with stated age. Hydration-Well hydrated. Voice-Normal.  Chest and Lung Exam Note: Pressures postsurgical radiation changes. No masses. Right breast is normal without mass lesion or nipple discharge.  Neurologic Neurologic evaluation reveals -alert and oriented x 3 with no impairment of recent or remote memory. Mental Status-Normal.  Lymphatic Head & Neck  General Head & Neck Lymphatics: Bilateral - Description - Normal. Axillary  General Axillary Region: Bilateral - Description - Normal. Tenderness - Non Tender.    Assessment & Plan (Jahmad Petrich A. 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 Patient agrees to proceed. Risk of lumpectomy include bleeding, infection, seroma, more surgery, use of seed/wire, wound care, cosmetic deformity and the need for other treatments, death , blood clots, death. Pt agrees to  proceed.  Current Plans Pt Education - [facility] Free Text Education/Instructions: discussed with patient and provided information. Pt Education - [facility] Breast Biopsy HCI: discussed with patient and provided information.

## 2020-01-03 ENCOUNTER — Ambulatory Visit (INDEPENDENT_AMBULATORY_CARE_PROVIDER_SITE_OTHER): Payer: BC Managed Care – PPO | Admitting: Allergy

## 2020-01-03 ENCOUNTER — Other Ambulatory Visit: Payer: Self-pay

## 2020-01-03 ENCOUNTER — Other Ambulatory Visit: Payer: Self-pay | Admitting: Allergy

## 2020-01-03 ENCOUNTER — Encounter: Payer: Self-pay | Admitting: Allergy

## 2020-01-03 VITALS — BP 104/74 | HR 88 | Temp 98.1°F | Resp 18

## 2020-01-03 DIAGNOSIS — T50905D Adverse effect of unspecified drugs, medicaments and biological substances, subsequent encounter: Secondary | ICD-10-CM

## 2020-01-03 DIAGNOSIS — J4540 Moderate persistent asthma, uncomplicated: Secondary | ICD-10-CM

## 2020-01-03 DIAGNOSIS — H1013 Acute atopic conjunctivitis, bilateral: Secondary | ICD-10-CM

## 2020-01-03 DIAGNOSIS — J3089 Other allergic rhinitis: Secondary | ICD-10-CM | POA: Diagnosis not present

## 2020-01-03 MED ORDER — BUDESONIDE-FORMOTEROL FUMARATE 160-4.5 MCG/ACT IN AERO: INHALATION_SPRAY | RESPIRATORY_TRACT | 5 refills | Status: DC

## 2020-01-03 MED ORDER — ALBUTEROL SULFATE HFA 108 (90 BASE) MCG/ACT IN AERS
INHALATION_SPRAY | RESPIRATORY_TRACT | 1 refills | Status: DC
Start: 2020-01-03 — End: 2020-08-15

## 2020-01-03 MED ORDER — MONTELUKAST SODIUM 10 MG PO TABS: ORAL_TABLET | ORAL | 5 refills | Status: DC

## 2020-01-03 NOTE — Progress Notes (Signed)
Follow-up Note  RE: Veronica Reilly MRN: [id number] DOB: 16-Nov-1965 Date of Office Visit: 01/03/2020   History of present illness: Veronica Reilly is a 54 y.o. female presenting today for follow-up of asthma, allergic rhinitis with conjunctivitis.   She was started on venlafaxine and was diagnosed with erythema nodusum.  She had the rash on her neck, arms, breast, thighs, back, feet.  She took benadryl initially with the rash this did not help and she went to the ED on 12/09/2019 and was told she had erythema nodsum.   She was prescribed a course of prednisone which she states helped but the rash returned after the course was completed.  She recalls starting the medication 2 weeks proir to the ED visit.  She states she started out with 1 pill for 2 weeks and was supposed to increase to twice a day.  However around the time she was supposed to increase she was noting the rash.  She believes the rash started about a week into taking the medication and kept getting worse.  The rash was painful and felt like "someone had hit me".  It became difficult to walk with the rash on the soles of feet.  The rash has resolved.  She stopped taking the medication.  She states the medication was not a medication that she needed to take and was using it to help with her hot flashes. In regards to her asthma she states she did need to use Symbicort on Monday due to cough however she states she is also having more postnasal drainage.  She believes the symptoms are mostly tied to the change in the weather going back and forth between colder and warmer weather.  She otherwise has not needed to use her Symbicort at any other times and has not had any frank asthma flareups.  She has not required any ED or urgent care visits or systemic steroids for asthma. She did change Xyzal to Zyrtec.  She has been using Nasacort which she states has been helping with the nasal drainage.  She also continues to take Singulair daily.   As she has no complaints of worsening allergy symptoms at this time. She did get her flu vaccine on Tuesday of this week and has some itching at the injection site.  She has had both doses of the Moderna vaccine and is planning to get her third dose over Christmas break.  Review of systems: Review of Systems  Constitutional: Negative.   HENT:       See HPI  Eyes: Negative.   Respiratory: Positive for cough.   Cardiovascular: Negative.   Gastrointestinal: Negative.   Musculoskeletal: Negative.   Skin: Positive for itching and rash.  Neurological: Negative.     All other systems negative unless noted above in HPI  Past medical/social/surgical/family history have been reviewed and are unchanged unless specifically indicated below.  No changes  Medication List: Current Outpatient Medications  Medication Sig Dispense Refill  . albuterol (VENTOLIN HFA) 108 (90 Base) MCG/ACT inhaler Use 2 puffs every four hours as needed for cough or wheeze.  May use 2 puffs 10-20 minutes prior to exercise. 18 g 1  . budesonide-formoterol (SYMBICORT) 160-4.5 MCG/ACT inhaler INHALE 2 PUFFS INTO THE LUNGS 2 (TWO) TIMES DAILY. 10.2 g 5  . Cholecalciferol (VITAMIN D3) 50 MCG (2000 UT) TABS Take 2,000 Units by mouth every evening.    Marland Kitchen EPINEPHrine 0.3 mg/0.3 mL IJ SOAJ injection Inject 0.3 mg into the muscle  as needed for anaphylaxis. 1 each 0  . esomeprazole (NEXIUM) 20 MG capsule Take 20 mg by mouth daily at 12 noon.    . folic acid (FOLVITE) 1 MG tablet Take 1 mg by mouth every evening.     . hydrochlorothiazide (MICROZIDE) 12.5 MG capsule Take 12.5 mg by mouth daily.   1  . levothyroxine (SYNTHROID, LEVOTHROID) 112 MCG tablet Take 112 mcg by mouth daily before breakfast.     . montelukast (SINGULAIR) 10 MG tablet Pt needs an appt before getting more refills. 30 tablet 0  . NIFEdipine (PROCARDIA XL/ADALAT-CC) 60 MG 24 hr tablet Take 60 mg by mouth daily.  1  . Olopatadine HCl (PAZEO) 0.7 % SOLN Apply 1  drop to eye daily. 2.5 mL 5  . tamoxifen (NOLVADEX) 20 MG tablet Take 20 mg by mouth daily.    Marland Kitchen triamcinolone (NASACORT) 55 MCG/ACT AERO nasal inhaler Place 2 sprays into the nose daily.    Marland Kitchen venlafaxine XR (EFFEXOR-XR) 37.5 MG 24 hr capsule Take 1 capsule (37.5 mg total) by mouth daily with breakfast. After two weeks, take 2 tablets daily by mouth with breakfast. 90 capsule 0  . levocetirizine (XYZAL) 5 MG tablet TAKE 1 TABLET(5 MG) BY MOUTH EVERY EVENING (Patient not taking: Reported on 01/03/2020) 30 tablet 1   No current facility-administered medications for this visit.     Known medication allergies: Allergies  Allergen Reactions  . Apple Anaphylaxis  . Iohexol     Contrast dye  . Other Shortness Of Breath    CHERRIES, TREES, MOLD,DUST, COCKROACHES  . Povidone-Iodine Anaphylaxis  . Shellfish Allergy Anaphylaxis  . Peach [Prunus Persica] Itching and Hives    mouth  . Zocor [Simvastatin] Other (See Comments)    Alopecia  . Betadine [Povidone Iodine]     Due to shellfish allergy  . Latex Rash     Physical examination: Blood pressure 104/74, pulse 88, temperature 98.1 F (36.7 C), temperature source Temporal, resp. rate 18, SpO2 99 %.  General: Alert, interactive, in no acute distress. HEENT: PERRLA, TMs pearly gray, turbinates minimally edematous with clear discharge, post-pharynx non erythematous. Neck: Supple without lymphadenopathy. Lungs: Clear to auscultation without wheezing, rhonchi or rales. {no increased work of breathing. CV: Normal S1, S2 without murmurs. Abdomen: Nondistended, nontender. Skin: Warm and dry, without lesions or rashes. Extremities:  No clubbing, cyanosis or edema. Neuro:   Grossly intact.  Diagnositics/Labs:  Spirometry: FEV1: 1.97L 79%, FVC: 2.66L 86%, ratio consistent with nonobstructive pattern  Assessment and plan:   Moderate persistent asthma Under good control . Asthma action plan (initiate if flared or having respiratory tract  illness): Symbicort 160- 2 puffs twice a day.   Rinse mouth afterwards. . Prior to physical activity: May use albuterol rescue inhaler 2 puffs 5 to 15 minutes prior to strenuous physical activities. Marland Kitchen Rescue medications: May use albuterol rescue inhaler 2 puffs or nebulizer every 4 to 6 hours as needed for shortness of breath, chest tightness, coughing, and wheezing. Monitor frequency of use.  Can also use Symbicort as needed and max puffs per day ~8-10 puffs . Asthma control goals:  o Full participation in all desired activities (may need albuterol before activity) o Albuterol use two times or less a week on average (not counting use with activity) o Cough interfering with sleep two times or less a month o Oral steroids no more than once a year o No hospitalizations   Allergic rhinoconjunctivitis - Continue appropriate avoidance measures for dust mites,  cat, grasses, trees, and weeds. - Saline nasal rinse daily as needed. - use Nasacort 2 sprays each nostril as needed for congestion.   This replaces Flonase. - Continue Zyrtec or Xyzal daily as needed. - Continue Singulair 10mg  daily - Continue Pazeo 1 drop each eye as needed for itchy/watery/red eyes.     Adverse drug reaction/delayed drug reaction - development of erythema nodosum vs urticaria due to use of Venlafaxine - reports nodules were painful which is less likely to be urticarial in nature - would recommend she avoid this medication in the future due to concern for delayed drug reaction  Follow up in 6 months or sooner if needed  I appreciate the opportunity to take part in [facility] care. Please do not hesitate to contact me with questions.  Sincerely,   Prudy Feeler, MD Allergy/Immunology [facility]

## 2020-01-03 NOTE — Patient Instructions (Addendum)
Moderate persistent asthma Under good control . Asthma action plan (initiate if flared or having respiratory tract illness): Symbicort 160- 2 puffs twice a day.   Rinse mouth afterwards. . Prior to physical activity: May use albuterol rescue inhaler 2 puffs 5 to 15 minutes prior to strenuous physical activities. Marland Kitchen Rescue medications: May use albuterol rescue inhaler 2 puffs or nebulizer every 4 to 6 hours as needed for shortness of breath, chest tightness, coughing, and wheezing. Monitor frequency of use.  Can also use Symbicort as needed and max puffs per day ~8-10 puffs . Asthma control goals:  o Full participation in all desired activities (may need albuterol before activity) o Albuterol use two times or less a week on average (not counting use with activity) o Cough interfering with sleep two times or less a month o Oral steroids no more than once a year o No hospitalizations   Allergic rhinoconjunctivitis - Continue appropriate avoidance measures for dust mites, cat, grasses, trees, and weeds. - Saline nasal rinse daily as needed. - use Nasacort 2 sprays each nostril as needed for congestion.   This replaces Flonase. - Continue Zyrtec or Xyzal daily as needed. - Continue Singulair 10mg  daily - Continue Pazeo 1 drop each eye as needed for itchy/watery/red eyes.     Adverse drug reaction/delayed drug reaction - development of erythema nodosum vs urticaria due to use of Venlafaxine - reports nodules were painful which is less likely to be urticarial in nature - would recommend she avoid this medication in the future due to concern for delayed drug reaction  Follow up in 6 months or sooner if needed

## 2020-01-30 ENCOUNTER — Other Ambulatory Visit: Payer: Self-pay | Admitting: Surgery

## 2020-01-30 DIAGNOSIS — N6489 Other specified disorders of breast: Secondary | ICD-10-CM

## 2020-02-09 ENCOUNTER — Other Ambulatory Visit: Payer: Self-pay | Admitting: Allergy

## 2020-03-05 ENCOUNTER — Encounter (HOSPITAL_BASED_OUTPATIENT_CLINIC_OR_DEPARTMENT_OTHER): Payer: Self-pay | Admitting: Surgery

## 2020-03-05 ENCOUNTER — Other Ambulatory Visit: Payer: Self-pay

## 2020-03-10 ENCOUNTER — Encounter (HOSPITAL_BASED_OUTPATIENT_CLINIC_OR_DEPARTMENT_OTHER)
Admission: RE | Admit: 2020-03-10 | Discharge: 2020-03-10 | Disposition: A | Discharge status: Home / Self Care | Payer: BC Managed Care – PPO | Source: Ambulatory Visit | Attending: Surgery | Admitting: Surgery

## 2020-03-10 ENCOUNTER — Other Ambulatory Visit (HOSPITAL_COMMUNITY): Payer: BC Managed Care – PPO

## 2020-03-10 DIAGNOSIS — Z01818 Encounter for other preprocedural examination: Secondary | ICD-10-CM | POA: Diagnosis present

## 2020-03-10 LAB — CBC WITH DIFFERENTIAL/PLATELET
Abs Immature Granulocytes: 0.03 10*3/uL (ref 0.00–0.07)
Basophils Absolute: 0.1 10*3/uL (ref 0.0–0.1)
Basophils Relative: 1 %
Eosinophils Absolute: 0.1 10*3/uL (ref 0.0–0.5)
Eosinophils Relative: 2 %
HCT: 37.6 % (ref 36.0–46.0)
Hemoglobin: 12.4 g/dL (ref 12.0–15.0)
Immature Granulocytes: 0 %
Lymphocytes Relative: 27 %
Lymphs Abs: 2.3 10*3/uL (ref 0.7–4.0)
MCH: 26.5 pg (ref 26.0–34.0)
MCHC: 33 g/dL (ref 30.0–36.0)
MCV: 80.3 fL (ref 80.0–100.0)
Monocytes Absolute: 0.9 10*3/uL (ref 0.1–1.0)
Monocytes Relative: 11 %
Neutro Abs: 5.2 10*3/uL (ref 1.7–7.7)
Neutrophils Relative %: 59 %
Platelets: 260 10*3/uL (ref 150–400)
RBC: 4.68 MIL/uL (ref 3.87–5.11)
RDW: 13.2 % (ref 11.5–15.5)
WBC: 8.7 10*3/uL (ref 4.0–10.5)
nRBC: 0 % (ref 0.0–0.2)

## 2020-03-10 LAB — COMPREHENSIVE METABOLIC PANEL
ALT: 12 U/L (ref 0–44)
AST: 13 U/L — ABNORMAL LOW (ref 15–41)
Albumin: 3.6 g/dL (ref 3.5–5.0)
Alkaline Phosphatase: 55 U/L (ref 38–126)
Anion gap: 10 (ref 5–15)
BUN: 9 mg/dL (ref 6–20)
CO2: 25 mmol/L (ref 22–32)
Calcium: 9.1 mg/dL (ref 8.9–10.3)
Chloride: 105 mmol/L (ref 98–111)
Creatinine, Ser: 0.79 mg/dL (ref 0.44–1.00)
GFR, Estimated: >60 mL/min (ref >60)
Glucose, Bld: 95 mg/dL (ref 70–99)
Potassium: 3.7 mmol/L (ref 3.5–5.1)
Sodium: 140 mmol/L (ref 135–145)
Total Bilirubin: 0.5 mg/dL (ref 0.3–1.2)
Total Protein: 6.9 g/dL (ref 6.5–8.1)

## 2020-03-10 NOTE — Progress Notes (Signed)

## 2020-03-11 ENCOUNTER — Other Ambulatory Visit (HOSPITAL_COMMUNITY)
Admission: RE | Admit: 2020-03-11 | Discharge: 2020-03-11 | Disposition: A | Discharge status: Home / Self Care | Payer: BC Managed Care – PPO | Source: Ambulatory Visit | Attending: Surgery | Admitting: Surgery

## 2020-03-11 DIAGNOSIS — Z01812 Encounter for preprocedural laboratory examination: Secondary | ICD-10-CM | POA: Insufficient documentation

## 2020-03-11 DIAGNOSIS — U071 COVID-19: Secondary | ICD-10-CM | POA: Diagnosis not present

## 2020-03-11 LAB — SARS CORONAVIRUS 2 (TAT 6-24 HRS): SARS Coronavirus 2: POSITIVE — AB

## 2020-03-12 ENCOUNTER — Telehealth: Payer: Self-pay | Admitting: Family

## 2020-03-12 NOTE — Progress Notes (Signed)
Patient with positive covid result. Contacted MD and informed of result.   

## 2020-03-12 NOTE — Telephone Encounter (Signed)
Called to discuss with patient about COVID-19 symptoms and the use of one of the available treatments for those with mild to moderate Covid symptoms and at a high risk of hospitalization.  Pt appears to qualify for outpatient treatment due to co-morbid conditions and/or a member of an at-risk group in accordance with the FDA Emergency Use Authorization.    Symptom onset: unknown Vaccinated: yes Booster: Unknown Immunocompromised: Yes Qualifiers: ethnicity,  Asthma, cancer  Unable to reach pt - Left VM. Sent MyChart message.    Loel Dubonnet

## 2020-03-12 NOTE — Telephone Encounter (Signed)
Patient returned call. She is aware of positive test. Tells me she has no symptoms. As such, no indication for treatment at this time. Reviewed isolation precautions.   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 Tells me stayed at the school and had some mild symptoms at that time and coughing up lots of mucus. Her allergy medications were changed at that time. Discussed that it could be still viral shedding if she was COVID positive at that time.  Loel Dubonnet, NP

## 2020-03-12 NOTE — Telephone Encounter (Signed)
Attempted to call again 03/12/20 at 12:13pm. Left VM.   Loel Dubonnet, NP

## 2020-03-12 NOTE — Progress Notes (Signed)
[facility] breast coordinator, Karlene Einstein, notified of positive Covid test for patient.

## 2020-03-13 DIAGNOSIS — N6489 Other specified disorders of breast: Secondary | ICD-10-CM

## 2020-03-14 ENCOUNTER — Telehealth: Payer: Self-pay

## 2020-03-14 ENCOUNTER — Encounter: Payer: Self-pay | Admitting: Oncology

## 2020-03-14 NOTE — Telephone Encounter (Signed)
Pt messaged through MyChart that she has tested positive for COVID. Contacted pt to answer questions about COVID treatment. Pt given information about monoclonal antibody treatment but stated since she has no symptoms and tested positive on 03/11/20 that she did not want it at this time.

## 2020-03-18 HISTORY — PX: BREAST EXCISIONAL BIOPSY: SUR124

## 2020-04-09 ENCOUNTER — Encounter (HOSPITAL_BASED_OUTPATIENT_CLINIC_OR_DEPARTMENT_OTHER): Payer: Self-pay | Admitting: Surgery

## 2020-04-09 ENCOUNTER — Other Ambulatory Visit: Payer: Self-pay

## 2020-04-09 ENCOUNTER — Other Ambulatory Visit: Payer: Self-pay | Admitting: Oncology

## 2020-04-15 ENCOUNTER — Other Ambulatory Visit: Payer: Self-pay

## 2020-04-15 ENCOUNTER — Ambulatory Visit
Admission: RE | Admit: 2020-04-15 | Discharge: 2020-04-15 | Disposition: A | Discharge status: Home / Self Care | Payer: BC Managed Care – PPO | Source: Ambulatory Visit | Attending: Surgery | Admitting: Surgery

## 2020-04-15 DIAGNOSIS — N6489 Other specified disorders of breast: Secondary | ICD-10-CM

## 2020-04-16 ENCOUNTER — Ambulatory Visit
Admission: RE | Admit: 2020-04-16 | Discharge: 2020-04-16 | Disposition: A | Discharge status: Home / Self Care | Payer: BC Managed Care – PPO | Source: Ambulatory Visit | Attending: Surgery | Admitting: Surgery

## 2020-04-16 ENCOUNTER — Ambulatory Visit (HOSPITAL_BASED_OUTPATIENT_CLINIC_OR_DEPARTMENT_OTHER)
Admission: RE | Admit: 2020-04-16 | Discharge: 2020-04-16 | Disposition: A | Discharge status: Home / Self Care | Payer: BC Managed Care – PPO | Attending: Surgery | Admitting: Surgery

## 2020-04-16 ENCOUNTER — Ambulatory Visit (HOSPITAL_BASED_OUTPATIENT_CLINIC_OR_DEPARTMENT_OTHER): Payer: BC Managed Care – PPO | Admitting: Certified Registered"

## 2020-04-16 ENCOUNTER — Encounter (HOSPITAL_BASED_OUTPATIENT_CLINIC_OR_DEPARTMENT_OTHER): Payer: Self-pay | Admitting: Surgery

## 2020-04-16 ENCOUNTER — Other Ambulatory Visit: Payer: Self-pay

## 2020-04-16 ENCOUNTER — Encounter (HOSPITAL_BASED_OUTPATIENT_CLINIC_OR_DEPARTMENT_OTHER)
Admission: RE | Disposition: A | Discharge status: Home / Self Care | Payer: Self-pay | Source: Home / Self Care | Attending: Surgery

## 2020-04-16 DIAGNOSIS — Z170 Estrogen receptor positive status [ER+]: Secondary | ICD-10-CM | POA: Diagnosis not present

## 2020-04-16 DIAGNOSIS — Z79810 Long term (current) use of selective estrogen receptor modulators (SERMs): Secondary | ICD-10-CM | POA: Diagnosis not present

## 2020-04-16 DIAGNOSIS — C50412 Malignant neoplasm of upper-outer quadrant of left female breast: Secondary | ICD-10-CM | POA: Insufficient documentation

## 2020-04-16 DIAGNOSIS — Z91040 Latex allergy status: Secondary | ICD-10-CM | POA: Diagnosis not present

## 2020-04-16 DIAGNOSIS — N6489 Other specified disorders of breast: Secondary | ICD-10-CM | POA: Insufficient documentation

## 2020-04-16 DIAGNOSIS — Z888 Allergy status to other drugs, medicaments and biological substances status: Secondary | ICD-10-CM | POA: Insufficient documentation

## 2020-04-16 DIAGNOSIS — Z79899 Other long term (current) drug therapy: Secondary | ICD-10-CM | POA: Insufficient documentation

## 2020-04-16 HISTORY — DX: Other specified disorders of breast: N64.89

## 2020-04-16 HISTORY — PX: BREAST LUMPECTOMY WITH RADIOACTIVE SEED LOCALIZATION: SHX6424

## 2020-04-16 LAB — POCT PREGNANCY, URINE: Preg Test, Ur: NEGATIVE

## 2020-04-16 SURGERY — BREAST LUMPECTOMY WITH RADIOACTIVE SEED LOCALIZATION
Anesthesia: General | Site: Breast | Laterality: Right

## 2020-04-16 MED ORDER — MIDAZOLAM HCL 2 MG/2ML IJ SOLN
INTRAMUSCULAR | Status: AC
  Filled 2020-04-16: qty 2

## 2020-04-16 MED ORDER — VANCOMYCIN HCL 500 MG IV SOLR
INTRAVENOUS | Status: DC | PRN
Start: 2020-04-16 — End: 2020-04-16
  Administered 2020-04-16: 500 mg via TOPICAL

## 2020-04-16 MED ORDER — SODIUM CHLORIDE 0.9 % IV SOLN
INTRAVENOUS | Status: DC | PRN
  Administered 2020-04-16: 500 mL

## 2020-04-16 MED ORDER — CHLORHEXIDINE GLUCONATE CLOTH 2 % EX PADS: 6.0000 | MEDICATED_PAD | Freq: Once | CUTANEOUS | Status: DC

## 2020-04-16 MED ORDER — PHENYLEPHRINE HCL (PRESSORS) 10 MG/ML IV SOLN
INTRAVENOUS | Status: DC | PRN
  Administered 2020-04-16: 80 ug via INTRAVENOUS

## 2020-04-16 MED ORDER — OXYCODONE HCL 5 MG PO TABS
5.0000 mg | ORAL_TABLET | Freq: Once | ORAL | Status: AC | PRN
  Administered 2020-04-16: 5 mg via ORAL

## 2020-04-16 MED ORDER — BUPIVACAINE-EPINEPHRINE (PF) 0.25% -1:200000 IJ SOLN
INTRAMUSCULAR | Status: DC | PRN
  Administered 2020-04-16: 19 mL

## 2020-04-16 MED ORDER — ONDANSETRON HCL 4 MG/2ML IJ SOLN
INTRAMUSCULAR | Status: DC | PRN
  Administered 2020-04-16: 4 mg via INTRAVENOUS

## 2020-04-16 MED ORDER — DEXAMETHASONE SODIUM PHOSPHATE 4 MG/ML IJ SOLN
INTRAMUSCULAR | Status: DC | PRN
  Administered 2020-04-16: 4 mg via INTRAVENOUS

## 2020-04-16 MED ORDER — PHENYLEPHRINE 40 MCG/ML (10ML) SYRINGE FOR IV PUSH (FOR BLOOD PRESSURE SUPPORT)
PREFILLED_SYRINGE | INTRAVENOUS | Status: AC
  Filled 2020-04-16: qty 20

## 2020-04-16 MED ORDER — FENTANYL CITRATE (PF) 100 MCG/2ML IJ SOLN
INTRAMUSCULAR | Status: DC | PRN
  Administered 2020-04-16: 25 ug via INTRAVENOUS
  Administered 2020-04-16: 100 ug via INTRAVENOUS

## 2020-04-16 MED ORDER — SODIUM CHLORIDE 0.9 % IV SOLN
INTRAVENOUS | Status: AC
  Filled 2020-04-16: qty 10

## 2020-04-16 MED ORDER — OXYCODONE HCL 5 MG/5ML PO SOLN: 5.0000 mg | Freq: Once | ORAL | Status: DC | PRN

## 2020-04-16 MED ORDER — ONDANSETRON HCL 4 MG/2ML IJ SOLN: 4.0000 mg | Freq: Once | INTRAMUSCULAR | Status: DC | PRN

## 2020-04-16 MED ORDER — HYDROCODONE-ACETAMINOPHEN 5-325 MG PO TABS: 1.0000 | ORAL_TABLET | Freq: Four times a day (QID) | ORAL | 0 refills | Status: DC | PRN

## 2020-04-16 MED ORDER — CEFAZOLIN SODIUM-DEXTROSE 2-4 GM/100ML-% IV SOLN
INTRAVENOUS | Status: AC
  Filled 2020-04-16: qty 100

## 2020-04-16 MED ORDER — LACTATED RINGERS IV SOLN: INTRAVENOUS | Status: DC

## 2020-04-16 MED ORDER — LIDOCAINE 2% (20 MG/ML) 5 ML SYRINGE
INTRAMUSCULAR | Status: DC | PRN
  Administered 2020-04-16: 60 mg via INTRAVENOUS

## 2020-04-16 MED ORDER — FENTANYL CITRATE (PF) 100 MCG/2ML IJ SOLN
INTRAMUSCULAR | Status: AC
  Filled 2020-04-16: qty 2

## 2020-04-16 MED ORDER — EPHEDRINE 5 MG/ML INJ
INTRAVENOUS | Status: AC
  Filled 2020-04-16: qty 10

## 2020-04-16 MED ORDER — MIDAZOLAM HCL 5 MG/5ML IJ SOLN
INTRAMUSCULAR | Status: DC | PRN
  Administered 2020-04-16: 2 mg via INTRAVENOUS

## 2020-04-16 MED ORDER — OXYCODONE HCL 5 MG PO TABS
ORAL_TABLET | ORAL | Status: AC
  Filled 2020-04-16: qty 1

## 2020-04-16 MED ORDER — PROPOFOL 10 MG/ML IV BOLUS
INTRAVENOUS | Status: DC | PRN
  Administered 2020-04-16: 150 mg via INTRAVENOUS

## 2020-04-16 MED ORDER — OXYCODONE HCL 5 MG/5ML PO SOLN: 5.0000 mg | Freq: Once | ORAL | Status: AC | PRN

## 2020-04-16 MED ORDER — CEFAZOLIN SODIUM-DEXTROSE 2-4 GM/100ML-% IV SOLN
2.0000 g | INTRAVENOUS | Status: AC
  Administered 2020-04-16: 2 g via INTRAVENOUS

## 2020-04-16 MED ORDER — OXYCODONE HCL 5 MG PO TABS: 5.0000 mg | ORAL_TABLET | Freq: Once | ORAL | Status: DC | PRN

## 2020-04-16 MED ORDER — FENTANYL CITRATE (PF) 100 MCG/2ML IJ SOLN: 25.0000 ug | INTRAMUSCULAR | Status: DC | PRN

## 2020-04-16 SURGICAL SUPPLY — 52 items
ADH SKN CLS APL DERMABOND .7 (GAUZE/BANDAGES/DRESSINGS) ×1
APL PRP STRL LF DISP 70% ISPRP (MISCELLANEOUS) ×1
APPLIER CLIP 9.375 MED OPEN (MISCELLANEOUS)
APR CLP MED 9.3 20 MLT OPN (MISCELLANEOUS)
BINDER BREAST LRG (GAUZE/BANDAGES/DRESSINGS) IMPLANT
BINDER BREAST MEDIUM (GAUZE/BANDAGES/DRESSINGS) IMPLANT
BINDER BREAST XLRG (GAUZE/BANDAGES/DRESSINGS) IMPLANT
BINDER BREAST XXLRG (GAUZE/BANDAGES/DRESSINGS) ×1 IMPLANT
BLADE SURG 15 STRL LF DISP TIS (BLADE) ×1 IMPLANT
BLADE SURG 15 STRL SS (BLADE) ×2
CANISTER SUC SOCK COL 7IN (MISCELLANEOUS) IMPLANT
CANISTER SUCT 1200ML W/VALVE (MISCELLANEOUS) IMPLANT
CHLORAPREP W/TINT 26 (MISCELLANEOUS) ×2 IMPLANT
CLIP APPLIE 9.375 MED OPEN (MISCELLANEOUS) IMPLANT
COVER BACK TABLE 60X90IN (DRAPES) ×2 IMPLANT
COVER MAYO STAND STRL (DRAPES) ×2 IMPLANT
COVER PROBE W GEL 5X96 (DRAPES) ×2 IMPLANT
COVER WAND RF STERILE (DRAPES) IMPLANT
DECANTER SPIKE VIAL GLASS SM (MISCELLANEOUS) IMPLANT
DERMABOND ADVANCED (GAUZE/BANDAGES/DRESSINGS) ×1
DERMABOND ADVANCED .7 DNX12 (GAUZE/BANDAGES/DRESSINGS) ×1 IMPLANT
DRAPE LAPAROSCOPIC ABDOMINAL (DRAPES) IMPLANT
DRAPE LAPAROTOMY 100X72 PEDS (DRAPES) ×2 IMPLANT
DRAPE UTILITY XL STRL (DRAPES) ×2 IMPLANT
ELECT COATED BLADE 2.86 ST (ELECTRODE) ×2 IMPLANT
ELECT REM PT RETURN 9FT ADLT (ELECTROSURGICAL) ×2
ELECTRODE REM PT RTRN 9FT ADLT (ELECTROSURGICAL) ×1 IMPLANT
GLOVE SRG 8 PF TXTR STRL LF DI (GLOVE) ×1 IMPLANT
GLOVE SURG LTX SZ8 (GLOVE) ×2 IMPLANT
GLOVE SURG UNDER POLY LF SZ8 (GLOVE) ×2
GOWN STRL REUS W/ TWL LRG LVL3 (GOWN DISPOSABLE) ×2 IMPLANT
GOWN STRL REUS W/ TWL XL LVL3 (GOWN DISPOSABLE) ×1 IMPLANT
GOWN STRL REUS W/TWL LRG LVL3 (GOWN DISPOSABLE) ×4
GOWN STRL REUS W/TWL XL LVL3 (GOWN DISPOSABLE) ×2
HEMOSTAT ARISTA ABSORB 3G PWDR (HEMOSTASIS) IMPLANT
HEMOSTAT SNOW SURGICEL 2X4 (HEMOSTASIS) IMPLANT
KIT MARKER MARGIN INK (KITS) ×2 IMPLANT
NDL HYPO 25X1 1.5 SAFETY (NEEDLE) ×1 IMPLANT
NEEDLE HYPO 25X1 1.5 SAFETY (NEEDLE) ×2 IMPLANT
NS IRRIG 1000ML POUR BTL (IV SOLUTION) ×2 IMPLANT
PACK BASIN DAY SURGERY FS (CUSTOM PROCEDURE TRAY) ×2 IMPLANT
PENCIL SMOKE EVACUATOR (MISCELLANEOUS) ×2 IMPLANT
SLEEVE SCD COMPRESS KNEE MED (STOCKING) ×2 IMPLANT
SPONGE LAP 4X18 RFD (DISPOSABLE) ×2 IMPLANT
SUT MNCRL AB 4-0 PS2 18 (SUTURE) ×2 IMPLANT
SUT SILK 2 0 SH (SUTURE) IMPLANT
SUT VICRYL 3-0 CR8 SH (SUTURE) ×2 IMPLANT
SYR CONTROL 10ML LL (SYRINGE) ×2 IMPLANT
TOWEL GREEN STERILE FF (TOWEL DISPOSABLE) ×2 IMPLANT
TRAY FAXITRON CT DISP (TRAY / TRAY PROCEDURE) ×2 IMPLANT
TUBE CONNECTING 20X1/4 (TUBING) IMPLANT
YANKAUER SUCT BULB TIP NO VENT (SUCTIONS) IMPLANT

## 2020-04-16 NOTE — Discharge Instructions (Signed)
[facility] °Office Phone Number [phone] ° °BREAST BIOPSY/ PARTIAL MASTECTOMY: POST OP INSTRUCTIONS ° °Always review your discharge instruction sheet given to you by the facility where your surgery was performed. ° °IF YOU HAVE DISABILITY OR FAMILY LEAVE FORMS, YOU MUST BRING THEM TO THE OFFICE FOR PROCESSING.  DO NOT GIVE THEM TO YOUR DOCTOR. ° °1. A prescription for pain medication may be given to you upon discharge.  Take your pain medication as prescribed, if needed.  If narcotic pain medicine is not needed, then you may take acetaminophen (Tylenol) or ibuprofen (Advil) as needed. °2. Take your usually prescribed medications unless otherwise directed °3. If you need a refill on your pain medication, please contact your pharmacy.  They will contact our office to request authorization.  Prescriptions will not be filled after 5pm or on week-ends. °4. You should eat very light the first 24 hours after surgery, such as soup, crackers, pudding, etc.  Resume your normal diet the day after surgery. °5. Most patients will experience some swelling and bruising in the breast.  Ice packs and a good support bra will help.  Swelling and bruising can take several days to resolve.  °6. It is common to experience some constipation if taking pain medication after surgery.  Increasing fluid intake and taking a stool softener will usually help or prevent this problem from occurring.  A mild laxative (Milk of Magnesia or Miralax) should be taken according to package directions if there are no bowel movements after 48 hours. °7. Unless discharge instructions indicate otherwise, you may remove your bandages 24-48 hours after surgery, and you may shower at that time.  You may have steri-strips (small skin tapes) in place directly over the incision.  These strips should be left on the skin for 7-10 days.  If your surgeon used skin glue on the incision, you may shower in 24 hours.  The glue will flake off over the  next 2-3 weeks.  Any sutures or staples will be removed at the office during your follow-up visit. °8. ACTIVITIES:  You may resume regular daily activities (gradually increasing) beginning the next day.  Wearing a good support bra or sports bra minimizes pain and swelling.  You may have sexual intercourse when it is comfortable. °a. You may drive when you no longer are taking prescription pain medication, you can comfortably wear a seatbelt, and you can safely maneuver your car and apply brakes. °b. RETURN TO WORK:  ______________________________________________________________________________________ °9. You should see your doctor in the office for a follow-up appointment approximately two weeks after your surgery.  Your doctor’s nurse will typically make your follow-up appointment when she calls you with your pathology report.  Expect your pathology report 2-3 business days after your surgery.  You may call to check if you do not hear from us after three days. °10. OTHER INSTRUCTIONS: _______________________________________________________________________________________________ _____________________________________________________________________________________________________________________________________ °_____________________________________________________________________________________________________________________________________ °_____________________________________________________________________________________________________________________________________ ° °WHEN TO CALL YOUR DOCTOR: °1. Fever over 101.0 °2. Nausea and/or vomiting. °3. Extreme swelling or bruising. °4. Continued bleeding from incision. °5. Increased pain, redness, or drainage from the incision. ° °The clinic staff is available to answer your questions during regular business hours.  Please don’t hesitate to call and ask to speak to one of the nurses for clinical concerns.  If you have a medical emergency, go to the nearest  emergency room or call 911.  A surgeon from [facility] is always on call at the hospital. ° °For further questions, please visit [email or web address]  ° ° ° ° °  Post Anesthesia Home Care Instructions ° °Activity: °Get plenty of rest for the remainder of the day. A responsible individual must stay with you for 24 hours following the procedure.  °For the next 24 hours, DO NOT: °-Drive a car °-Operate machinery °-Drink alcoholic beverages °-Take any medication unless instructed by your physician °-Make any legal decisions or sign important papers. ° °Meals: °Start with liquid foods such as gelatin or soup. Progress to regular foods as tolerated. Avoid greasy, spicy, heavy foods. If nausea and/or vomiting occur, drink only clear liquids until the nausea and/or vomiting subsides. Call your physician if vomiting continues. ° °Special Instructions/Symptoms: °Your throat may feel dry or sore from the anesthesia or the breathing tube placed in your throat during surgery. If this causes discomfort, gargle with warm salt water. The discomfort should disappear within 24 hours. ° °If you had a scopolamine patch placed behind your ear for the management of post- operative nausea and/or vomiting: ° °1. The medication in the patch is effective for 72 hours, after which it should be removed.  Wrap patch in a tissue and discard in the trash. Wash hands thoroughly with soap and water. °2. You may remove the patch earlier than 72 hours if you experience unpleasant side effects which may include dry mouth, dizziness or visual disturbances. °3. Avoid touching the patch. Wash your hands with soap and water after contact with the patch. °  ° °

## 2020-04-16 NOTE — Interval H&P Note (Signed)
History and Physical Interval Note:  04/16/2020 2:23 PM  Veronica Reilly  has presented today for surgery, with the diagnosis of Ajo.  The various methods of treatment have been discussed with the patient and family. After consideration of risks, benefits and other options for treatment, the patient has consented to  Procedure(s): RIGHT BREAST LUMPECTOMY WITH RADIOACTIVE SEED LOCALIZATION (Right) as a surgical intervention.  The patient's history has been reviewed, patient examined, no change in status, stable for surgery.  I have reviewed the patient's chart and labs.  Questions were answered to the patient's satisfaction.     Turner Daniels MD

## 2020-04-16 NOTE — Op Note (Signed)
Preoperative diagnosis: Right breast radial scar  Postoperative diagnosis: Same  Procedure: Right breast seed localized lumpectomy  Surgeon: Erroll Luna MD  Assistant: Dr. Wallie Char MD  Anesthesia: LMA with local  EBL: Minimal  Specimen: Right breast tissue with seed and clip verified by Faxitron  Drains: None  IV fluids: Per anesthesia record  Indications for procedure: The patient is a 55 year old female with a right breast mammographic abnormality.  Core biopsy showed radial scar.  She has history of left breast cancer.  She opted for right breast lumpectomy due to the potential upgrade risk.The procedure has been discussed with the patient. Alternatives to surgery have been discussed with the patient.  Risks of surgery include bleeding,  Infection,  Seroma formation, death,  and the need for further surgery.   The patient understands and wishes to proceed.   Description of procedure: The patient was met in holding area and questions were answered.  Right breast was examined with the neoprobe and seed localized in the right breast.  Questions were answered.  The right breast was marked as the correct site.  She was then taken back to the operating.  She is placed upon the OR table.  After induction of general esthesia right breast was prepped and draped in sterile fashion timeout performed.  Proper patient, site and procedure were verified.  Neoprobe used to seed identified in the medial right breast.  Local anesthetic was infiltrated in the medial right breast.  Transverse incision was made.  Dissection was carried down all tissue around the seed and clip were excised with a grossly negative margin.  Hemostasis achieved.  Antibiotic solution used for  irrigation and vancomycin powder 500 mg placed.  Hemostasis excellent.  Wound closed with a deep layer of 3-0 Vicryl and 4-0 Monocryl for the skin.  Dressings applied.  All counts found to be correct.  Patient was extubated taken recovery in  satisfactory condition.

## 2020-04-16 NOTE — Anesthesia Postprocedure Evaluation (Signed)
Anesthesia Post Note  Patient: Veronica Reilly  Procedure(s) Performed: RIGHT BREAST LUMPECTOMY WITH RADIOACTIVE SEED LOCALIZATION (Right Breast)     Patient location during evaluation: PACU Anesthesia Type: General Level of consciousness: awake and alert Pain management: pain level controlled Vital Signs Assessment: post-procedure vital signs reviewed and stable Respiratory status: spontaneous breathing, nonlabored ventilation, respiratory function stable and patient connected to nasal cannula oxygen Cardiovascular status: blood pressure returned to baseline and stable Postop Assessment: no apparent nausea or vomiting Anesthetic complications: no   No complications documented.  Last Vitals:  Vitals:   04/16/20 1615 04/16/20 1635  BP: 114/76 (!) 139/91  Pulse: (!) 102   Resp: 17 18  Temp:  36.7 C  SpO2: 100% 100%    Last Pain:  Vitals:   04/16/20 1635  TempSrc:   PainSc: 0-No pain                 Alaycia Eardley COKER

## 2020-04-16 NOTE — Anesthesia Preprocedure Evaluation (Addendum)
Anesthesia Evaluation  Patient identified by MRN, date of birth, ID band Patient awake    Reviewed: Allergy & Precautions, NPO status , Patient's Chart, lab work & pertinent test results  Airway Mallampati: II  TM Distance: >3 FB Neck ROM: Full    Dental  (+) Teeth Intact, Dental Advisory Given   Pulmonary    breath sounds clear to auscultation       Cardiovascular hypertension,  Rhythm:Regular Rate:Normal     Neuro/Psych    GI/Hepatic   Endo/Other    Renal/GU      Musculoskeletal   Abdominal   Peds  Hematology   Anesthesia Other Findings   Reproductive/Obstetrics                            Anesthesia Physical Anesthesia Plan  ASA: II  Anesthesia Plan: General   Post-op Pain Management:    Induction: Intravenous  PONV Risk Score and Plan: Ondansetron and Dexamethasone  Airway Management Planned: LMA  Additional Equipment:   Intra-op Plan:   Post-operative Plan: Extubation in OR  Informed Consent: I have reviewed the patients History and Physical, chart, labs and discussed the procedure including the risks, benefits and alternatives for the proposed anesthesia with the patient or authorized representative who has indicated his/her understanding and acceptance.     Dental advisory given  Plan Discussed with: CRNA and Anesthesiologist  Anesthesia Plan Comments:         Anesthesia Quick Evaluation

## 2020-04-16 NOTE — Transfer of Care (Signed)
Immediate Anesthesia Transfer of Care Note  Patient: Veronica Reilly  Procedure(s) Performed: RIGHT BREAST LUMPECTOMY WITH RADIOACTIVE SEED LOCALIZATION (Right Breast)  Patient Location: PACU  Anesthesia Type:General  Level of Consciousness: awake, alert , oriented and patient cooperative  Airway & Oxygen Therapy: Patient Spontanous Breathing and Patient connected to face mask oxygen  Post-op Assessment: Report given to RN and Post -op Vital signs reviewed and stable  Post vital signs: Reviewed and stable  Last Vitals:  Vitals Value Taken Time  BP 107/73 04/16/20 1539  Temp    Pulse 120 04/16/20 1540  Resp 20 04/16/20 1540  SpO2 100 % 04/16/20 1540  Vitals shown include unvalidated device data.  Last Pain:  Vitals:   04/16/20 1359  TempSrc: Oral  PainSc: 0-No pain         Complications: No complications documented.

## 2020-04-16 NOTE — Anesthesia Procedure Notes (Signed)
Procedure Name: LMA Insertion Date/Time: 04/16/2020 2:53 PM Performed by: Skylin Kennerson, Ernesta Amble, CRNA Pre-anesthesia Checklist: Patient identified, Emergency Drugs available, Suction available and Patient being monitored Patient Re-evaluated:Patient Re-evaluated prior to induction Oxygen Delivery Method: Circle System Utilized Preoxygenation: Pre-oxygenation with 100% oxygen Induction Type: IV induction Ventilation: Mask ventilation without difficulty LMA: LMA inserted LMA Size: 4.0 Number of attempts: 1 Airway Equipment and Method: bite block Placement Confirmation: positive ETCO2 Tube secured with: Tape Dental Injury: Teeth and Oropharynx as per pre-operative assessment

## 2020-04-16 NOTE — H&P (Signed)
Veronica Reilly  Location: [facility] Patient #: [id number] DOB: May 16, 1965 Married / Language: English / Race: Black or African American Female  History of Present Illness Patient words: Patient returns for follow-up of her left breast cancer. She underwent her yearly mammogram and had 2 right breast biopsies performed. One showed a radial scar. Otherwise, she is been doing well with all complaint.   55 y.o. [place] status post left breast upper outer quadrant biopsy 06/29/2018 for a clinical T2N0 invasive ductal carcinoma, grade 2, estrogen receptor positive, progesterone receptor negative, with an MIB-1-1 of 10%, and no HER-2 amplification   Diagnosis 1. Breast, right, needle core biopsy, upper inner - FIBROADENOMATOID CHANGE WITH CALCIFICATIONS. - NO MALIGNANCY IDENTIFIED. 2. Breast, right, needle core biopsy, lower inner - FIBROADENOMATOID CHANGE WITH CALCIFICATIONS. - COMPLEX SCLEROSING LESION WITH FLORID USUAL DUCTAL HYPERPLASIA, SEE COMMENT. Microscopic Comment 2. The differential includes a hyalinized intraductal papilloma. The case was called to [facility] on 11/08/2019. Vicente Males MD Pathologist, Electronic Signature (Case signed 11/08/2019) Specimen Gross and Clinical Information Specimen Comment 1. TIF: 8:15am CIT: < 2 min, hx left br ca in 2020, calcifications 2. TIF: 8:30am CIT: < 2 min, calcifications Specimen(s) Obtained: 1. Breast, right, needle core biopsy, upper inner 2. Breast, right, needle core biopsy, lower inner Specimen Clinical.  The patient is a 55 year old female.   Allergies Zocor *ANTIHYPERLIPIDEMICS* Betadine *ANTISEPTICS & DISINFECTANTS* SHELLFISH Latex Allergies Reconciled  Medication History  Loratadine (Oral) Specific strength unknown - Active. Nasacort (55MCG/ACT Aerosol Soln, Nasal) Active. NexIUM (20MG Capsule DR, Oral) Active. Tamoxifen Citrate (20MG Tablet, Oral)  Active. hydroCHLOROthiazide (12.5MG Capsule, Oral) Active. Folic Acid (1MG Tablet, Oral) Active. Levothyroxine Sodium (112MCG Tablet, Oral) Active. Singulair (10MG Tablet, Oral) Active. NIFEdipine ER Osmotic Release (60MG Tablet ER 24HR, Oral) Active. Vitamin D (Oral) Specific strength unknown - Active. Medications Reconciled    Vitals  Weight: 224 lb Height: 66in Body Surface Area: 2.1 m Body Mass Index: 36.15 kg/m  Temp.: 97.51F  Pulse: 104 (Regular)  BP: 126/78(Sitting, Left Arm, Standard)        Physical Exam   General Mental Status-Alert. General Appearance-Consistent with stated age. Hydration-Well hydrated. Voice-Normal.  Chest and Lung Exam Note: Pressures postsurgical radiation changes. No masses. Right breast is normal without mass lesion or nipple discharge.  Neurologic Neurologic evaluation reveals -alert and oriented x 3 with no impairment of recent or remote memory. Mental Status-Normal.  Lymphatic Head & Neck  General Head & Neck Lymphatics: Bilateral - Description - Normal. Axillary  General Axillary Region: Bilateral - Description - Normal. Tenderness - Non Tender.    Assessment & Plan  BREAST CANCER, LEFT (C50.912) Impression: follow up 1 year total time 20 minutes   RADIAL SCAR OF RIGHT BREAST (N64.89) Impression: Discussed right breast seed localized lumpectomy for potential upgrade risk of up to 10%. Patient agrees to proceed. Risk of lumpectomy include bleeding, infection, seroma, more surgery, use of seed/wire, wound care, cosmetic deformity and the need for other treatments, death , blood clots, death. Pt agrees to proceed.  Current Plans Pt Education - [facility] Free Text Education/Instructions: discussed with patient and provided information. Pt Education - [facility] Breast Biopsy HCI: discussed with patient and provided information.

## 2020-04-17 ENCOUNTER — Encounter (HOSPITAL_BASED_OUTPATIENT_CLINIC_OR_DEPARTMENT_OTHER): Payer: Self-pay | Admitting: Surgery

## 2020-04-17 ENCOUNTER — Other Ambulatory Visit: Payer: Self-pay

## 2020-04-17 MED ORDER — EPINEPHRINE 0.3 MG/0.3ML IJ SOAJ: 0.3000 mg | Freq: Once | INTRAMUSCULAR | Status: AC

## 2020-04-17 NOTE — Progress Notes (Signed)
Veronica Reilly was sent in to [facility].  Patient was notified.

## 2020-04-21 ENCOUNTER — Telehealth: Payer: Self-pay

## 2020-04-21 LAB — SURGICAL PATHOLOGY

## 2020-04-21 NOTE — Telephone Encounter (Signed)
Returned call to pt. Pt awaiting path results from her breast lumpectomy from last week. Pt had contacted surgeons office and is awaiting return call. Pt to call back if she does not hear from them.

## 2020-05-05 NOTE — Progress Notes (Signed)
Veronica Reilly  Telephone:([phone]) [phone] Fax:(336) [phone]     ID: Veronica Reilly DOB: 1965-06-30  MR#: [id number]  SJG#:[id number]  Patient Care Team: Leeroy Cha, MD as PCP - General (Internal Medicine) Constancia Geeting, Virgie Dad, MD as Consulting Physician (Oncology) Kyung Rudd, MD as Consulting Physician (Radiation Oncology) Erroll Luna, MD as Consulting Physician (General Surgery) Delsa Bern, MD as Consulting Physician (Obstetrics and Gynecology) Kennith Gain, MD as Consulting Physician (Allergy) Delrae Rend, MD as Consulting Physician (Endocrinology) Juanita Craver, MD as Consulting Physician (Gastroenterology) Chauncey Cruel, MD OTHER MD:  CHIEF COMPLAINT: estrogen receptor positive breast cancer  CURRENT TREATMENT: tamoxifen   INTERVAL HISTORY: Veronica Reilly" returns today for follow up of her estrogen receptor positive breast cancer.  Her husband Veronica Reilly participated during part of the visit through speaker phone  She continues on tamoxifen.  She does have significant problems with hot flashes and she wonders if there is anything we can do to help with that.  Vaginal wetness is not an issue.  Since her last visit, she underwent biopsy of the two right breast areas on 11/07/2019. Pathology from the procedure ([id number]) showed: 1. Upper Inner  - fibroadenomatoid change with calcifications 2. Lower Inner  - fibroadenomatoid change with calcifications  - complex sclerosing lesion with florid usual ductal hyperplasia  She opted to proceed with lumpectomy. However, she tested positive for Covid on 03/11/2020.  She eventually underwent right lumpectomy on 04/16/2020 under Dr. Brantley Stage. Pathology from the procedure ([id number]) showed: complex sclerosing lesion to include hyalinized intraductal papilloma; usual ductal hyperplasia.   REVIEW OF SYSTEMS: Veronica Reilly" did well with her surgery, with very little pain no bleeding or fever.   She tells me she is trying to clean up her diet.  She has not been exercising regularly but she [place] [employer] [place] and see if planning to return to do that.  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  She also tells me her husband is going to become a traveling RN despite his problems with CKD.  This also has a source of stress.   COVID 19 VACCINATION STATUS: Moderna x2, most recently 03/2019; infection 02/2020     HISTORY OF CURRENT ILLNESS: From the original intake note:  Veronica Reilly had some calcifications in the left breast noted March 2019 on screening mammography.. She underwent biopsy of the left retroareolar area of concern on 03/18/2017 with pathology ([id number]) showing fibrocystic changes with no malignancy. Return in 6 months for left diagnostic mammography and ultrasonography was recommended, and she reports this took place in November 2019 at her gynecologist's office and the changes previously noted were stable.  She underwent bilateral diagnostic mammography with tomography and left breast ultrasonography at [facility] on 06/27/2018 showing: breast density category C; left breast upper-outer quadrant 3.1 cm group of indeterminate calcifications; persistent benign-appearing cyst in the subareolar left breast, post core needle biopsy changes.  On physical exam there were no palpable masses.  Targeted ultrasound revealed no suspicious masses.  There was a left subareolar breast cyst measuring 1.0 cm.  Left axilla was unremarkable.  Accordingly on 06/29/2018 she proceeded to biopsy of the left breast calcifications area in question. The pathology from this procedure ([id number]) showed: invasive ductal carcinoma, grade 2, with ductal carcinoma in situ. Prognostic indicators significant for: estrogen receptor, 100% positive with strong staining  intensity and  progesterone receptor, 0% negative. Proliferation marker Ki67 at 10%. HER2 negtive by immunohistochemistry, (0).  The patient's subsequent history is as detailed below.   PAST MEDICAL HISTORY: Past Medical History:  Diagnosis Date  . ASCUS (atypical squamous cells of undetermined significance) on Pap smear   . Asthma    controlled with daily inhaler  . Breast cancer ([place]) 06/29/2018   Left IDC  . Dysplasia of cervix, low grade (CIN 1)   . Eczema   . Family history of breast cancer   . Family history of lung cancer   . Family history of prostate cancer   . GERD (gastroesophageal reflux disease)   . Heart murmur   . History of chicken pox   . Hypertension   . Hypothyroid   . Personal history of chemotherapy   . Personal history of radiation therapy   . Radial scar of right breast   . Uterine fibroid   Heart murmur, evaluated by cardiologist   PAST SURGICAL HISTORY: Past Surgical History:  Procedure Laterality Date  . ADENOIDECTOMY    . BREAST LUMPECTOMY Left 08/22/2018  . BREAST LUMPECTOMY WITH RADIOACTIVE SEED AND SENTINEL LYMPH NODE BIOPSY Left 08/22/2018   Procedure: LEFT BREAST LUMPECTOMY WITH RADIOACTIVE SEED AND SENTINEL LYMPH NODE MAPPING;  Surgeon: Erroll Luna, MD;  Location: [facility];  Service: General;  Laterality: Left;  . BREAST LUMPECTOMY WITH RADIOACTIVE SEED LOCALIZATION Right 04/16/2020   Procedure: RIGHT BREAST LUMPECTOMY WITH RADIOACTIVE SEED LOCALIZATION;  Surgeon: Erroll Luna, MD;  Location: [facility];  Service: General;  Laterality: Right;  . LEEP    . MYOMECTOMY    . PORT-A-CATH REMOVAL N/A 03/13/2019   Procedure: PORT REMOVAL;  Surgeon: Erroll Luna, MD;  Location: [facility];  Service: General;  Laterality: N/A;  . PORTACATH PLACEMENT Right 09/27/2018   Procedure: INSERTION PORT-A-CATH WITH ULTRASOUND;  Surgeon: Erroll Luna, MD;  Location: [facility];  Service: General;  Laterality: Right;  .  TONSILECTOMY, ADENOIDECTOMY, BILATERAL MYRINGOTOMY AND TUBES    . TONSILLECTOMY      FAMILY HISTORY: Family History  Problem Relation Age of Onset  . Breast cancer Maternal Grandmother   . Heart disease Father   . Diabetes Father   . Prostate cancer Father 53  . Breast cancer Mother 23  . Hypertension Other   . Lung cancer Maternal Aunt   . Allergic rhinitis Neg Hx   . Angioedema Neg Hx   . Asthma Neg Hx   . Eczema Neg Hx   . Immunodeficiency Neg Hx   . Urticaria Neg Hx   Patient's father was 56 years old when he died from diabetes and renal failure. Patient's mother died from breast cancer at age 6. The patient notes a family hx of breast and possibly ovarian cancer. Her mother was diagnosed with inflammatory breast cancer at around age 1 (she was a patient here), and the patient maternal grandmother was also diagnosed with breast cancer and died at age 82.  The patient has 1 brother, no sisters. She also had one maternal aunt with lung cancer and a history of smoking, her father with prostate cancer at age 43, and a possible GYN cancer in a paternal aunt.   GYNECOLOGIC HISTORY:  Last menstrual period February 2020  menarche: 55 years old GX P 0, she has had fertility treatments LMP 05/2018, lasted 3 days, irregular (s/p ablation) Contraceptive: used for apprx. 6 months HRT never used  Hysterectomy? no BSO?  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 Husband Veronica Reilly is a trauma nurse at [employer]. She lives at home with her husband and some hermit crabs and salt and fresh water fish. 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    ADVANCED DIRECTIVES: Husband Veronica Reilly is her HCPOA.   HEALTH MAINTENANCE: Social History   Tobacco  Use  . Smoking status: Never Smoker  . Smokeless tobacco: Never Used  Substance Use Topics  . Alcohol use: No  . Drug use: No     Colonoscopy: Pending  PAP: Up-to-date  Bone density: never done   Allergies  Allergen Reactions  . Apple Anaphylaxis  . Iohexol     Contrast dye  . Other Shortness Of Breath    CHERRIES, TREES, MOLD,DUST, COCKROACHES  . Povidone-Iodine Anaphylaxis  . Shellfish Allergy Anaphylaxis  . Peach [Prunus Persica] Itching and Hives    mouth  . Zocor [Simvastatin] Other (See Comments)    Alopecia  . Betadine [Povidone Iodine]     Due to shellfish allergy  . Effexor [Venlafaxine] Swelling  . Wound Dressing Adhesive   . Latex Rash    Current Outpatient Medications  Medication Sig Dispense Refill  . albuterol (VENTOLIN HFA) 108 (90 Base) MCG/ACT inhaler Use 2 puffs every four hours as needed for cough or wheeze.  May use 2 puffs 10-20 minutes prior to exercise. 18 g 1  . budesonide-formoterol (SYMBICORT) 160-4.5 MCG/ACT inhaler INHALE 2 PUFFS INTO THE LUNGS 2 (TWO) TIMES DAILY. 10.2 g 5  . Cholecalciferol (VITAMIN D3) 50 MCG (2000 UT) TABS Take 2,000 Units by mouth every evening.    Marland Kitchen EPINEPHrine 0.3 mg/0.3 mL IJ SOAJ injection Inject 0.3 mg into the muscle as needed for anaphylaxis. 1 each 0  . esomeprazole (NEXIUM) 20 MG capsule Take 20 mg by mouth daily at 12 noon.    . folic acid (FOLVITE) 1 MG tablet Take 1 mg by mouth every evening.     . hydrochlorothiazide (MICROZIDE) 12.5 MG capsule Take 12.5 mg by mouth daily.   1  . HYDROcodone-acetaminophen (NORCO/VICODIN) 5-325 MG tablet Take 1 tablet by mouth every 6 (six) hours as needed for moderate pain. 15 tablet 0  . levocetirizine (XYZAL) 5 MG tablet TAKE 1 TABLET(5 MG) BY MOUTH EVERY EVENING 30 tablet 1  . levothyroxine (SYNTHROID, LEVOTHROID) 112 MCG tablet Take 112 mcg by mouth daily before breakfast.     . montelukast (SINGULAIR) 10 MG tablet TAKE 1 TABLET BY MOUTH EVERY DAY 30 tablet 5  . NIFEdipine  (PROCARDIA XL/ADALAT-CC) 60 MG 24 hr tablet Take 60 mg by mouth daily.  1  . Olopatadine HCl (PAZEO) 0.7 % SOLN Apply 1 drop to eye daily. 2.5 mL 5  . tamoxifen (NOLVADEX) 20 MG tablet TAKE 1 TABLET(20 MG) BY MOUTH DAILY 90 tablet 2  . triamcinolone (NASACORT) 55 MCG/ACT AERO nasal inhaler Place 2 sprays into the nose daily.     Current Facility-Administered Medications  Medication Dose Route Frequency Provider Last Rate Last Admin  . EPINEPHrine (EPI-PEN) injection 0.3 mg  0.3 mg Intramuscular Once Kennith Gain, MD        OBJECTIVE: African-American woman who appears stated age  55:   05/06/20 1458  BP: 117/62  Pulse: 87  Resp: 18  Temp: 98.1 F (36.7 C)  SpO2: 100%     Body mass index is 35.41 kg/m.   Wt Readings from Last 3 Encounters:  05/06/20 222 lb 11.2 oz (101 kg)  04/16/20 227 lb 4.7 oz (103.1 kg)  11/06/19 222 lb 3.2 oz (100.8 kg)  ECOG FS:1 - Symptomatic but completely ambulatory  Sclerae unicteric, EOMs intact Wearing a mask No cervical or supraclavicular adenopathy Lungs no rales or rhonchi Heart regular rate and rhythm Abd soft, nontender, positive bowel sounds MSK no focal spinal tenderness, no upper extremity lymphedema Neuro: nonfocal, well oriented, appropriate affect Breasts: The right breast is status post recent lumpectomy.  Incision is healing very nicely, with no erythema swelling or dehiscence.  The left breast is status post lumpectomy and radiation.  Both axillae are benign.   LAB RESULTS:  CMP     Component Value Date/Time   NA 142 05/06/2020 1436   K 3.5 05/06/2020 1436   CL 105 05/06/2020 1436   CO2 28 05/06/2020 1436   GLUCOSE 121 (H) 05/06/2020 1436   BUN 13 05/06/2020 1436   CREATININE 0.82 05/06/2020 1436   CREATININE 0.81 05/03/2019 1020   CALCIUM 9.3 05/06/2020 1436   PROT 7.2 05/06/2020 1436   ALBUMIN 3.8 05/06/2020 1436   AST 10 (L) 05/06/2020 1436   AST 10 (L) 05/03/2019 1020   ALT 7 05/06/2020 1436   ALT  8 05/03/2019 1020   ALKPHOS 62 05/06/2020 1436   BILITOT 0.3 05/06/2020 1436   BILITOT 0.5 05/03/2019 1020   GFRNONAA >60 05/06/2020 1436   GFRNONAA >60 05/03/2019 1020   GFRAA >60 05/03/2019 1020    No results found for: TOTALPROTELP, ALBUMINELP, A1GS, A2GS, BETS, BETA2SER, GAMS, MSPIKE, SPEI  No results found for: KPAFRELGTCHN, LAMBDASER, KAPLAMBRATIO  Lab Results  Component Value Date   WBC 8.7 05/06/2020   NEUTROABS 5.0 05/06/2020   HGB 12.2 05/06/2020   HCT 36.7 05/06/2020   MCV 81.2 05/06/2020   PLT 251 05/06/2020   No results found for: LABCA2  No components found for: [id number]  No results for input(s): INR in the last 168 hours.  No results found for: LABCA2  No results found for: OYD741  No results found for: OIN867  No results found for: EHM094  No results found for: CA2729  No components found for: HGQUANT  No results found for: CEA1 / No results found for: CEA1   No results found for: AFPTUMOR  No results found for: CHROMOGRNA  No results found for: HGBA, HGBA2QUANT, HGBFQUANT, HGBSQUAN (Hemoglobinopathy evaluation)   No results found for: LDH  Lab Results  Component Value Date   IRON 54 10/29/2019   TIBC 285 10/29/2019   IRONPCTSAT 19 (L) 10/29/2019   (Iron and TIBC)  Lab Results  Component Value Date   FERRITIN 103 10/29/2019    Urinalysis    Component Value Date/Time   COLORURINE YELLOW 05/15/2007 0957   APPEARANCEUR CLEAR 05/15/2007 0957   LABSPEC 1.010 05/15/2007 0957   PHURINE 8.0 05/15/2007 0957   GLUCOSEU NEGATIVE 05/15/2007 0957   HGBUR NEGATIVE 05/15/2007 0957   BILIRUBINUR NEGATIVE 05/15/2007 0957   KETONESUR NEGATIVE 05/15/2007 0957   PROTEINUR NEGATIVE 05/15/2007 0957   UROBILINOGEN 0.2 05/15/2007 0957   NITRITE NEGATIVE 05/15/2007 0957   LEUKOCYTESUR  05/15/2007 0957    NEGATIVE MICROSCOPIC NOT DONE ON URINES WITH NEGATIVE PROTEIN, BLOOD, LEUKOCYTES, NITRITE, OR GLUCOSE <1000 mg/dL.    STUDIES: MM Breast  Surgical Specimen  Result  Date: 04/16/2020 CLINICAL DATA:  Status post excisional biopsy of the right breast. EXAM: SPECIMEN RADIOGRAPH OF THE RIGHT BREAST COMPARISON:  Previous exam(s). FINDINGS: Status post excision of the right breast. The radioactive seed and ribbon shaped biopsy marker clip are present, completely intact, and were marked for pathology. IMPRESSION: Specimen radiograph of the right breast. Electronically Signed   By: Lillia Mountain M.D.   On: 04/16/2020 15:22   MM RT RADIOACTIVE SEED LOC MAMMO GUIDE  Result Date: 04/15/2020 CLINICAL DATA:  Localization prior to surgery EXAM: MAMMOGRAPHIC GUIDED RADIOACTIVE SEED LOCALIZATION OF THE RIGHT BREAST COMPARISON:  Previous exam(s). FINDINGS: Patient presents for radioactive seed localization prior to surgery. I met with the patient and we discussed the procedure of seed localization including benefits and alternatives. We discussed the high likelihood of a successful procedure. We discussed the risks of the procedure including infection, bleeding, tissue injury and further surgery. We discussed the low dose of radioactivity involved in the procedure. Informed, written consent was given. The usual time-out protocol was performed immediately prior to the procedure. Using mammographic guidance, sterile technique, 1% lidocaine and an I-125 radioactive seed, the biopsy clip was localized using a medial approach. The follow-up mammogram images confirm the seed in the expected location and were marked for a surgeon. Follow-up survey of the patient confirms presence of the radioactive seed. Order number of I-125 seed:  [id number]. Total activity:  0.093 millicuries reference Date: March 20, 2020 The patient tolerated the procedure well and was released from the [facility]. She was given instructions regarding seed removal. IMPRESSION: Radioactive seed localization right breast. No apparent complications. Electronically Signed   By: Dorise Bullion III M.D    On: 04/15/2020 13:43     ELIGIBLE FOR AVAILABLE RESEARCH PROTOCOL: no  Hemoglobin electrophoresis 10/29/2019 is consistent with sickle cell trait (56.6% hemoglobin A, 2.8% hemoglobin A 2, 40.6% hemoglobin S, 0% hemoglobin F).  (a) ferritin 103 with saturation 19% and MCV 79.4 on 10/29/2019.  ASSESSMENT: 55 y.o.  [place] woman status post left breast upper outer quadrant biopsy 06/29/2018 for a clinical T2N0 invasive ductal carcinoma, grade 2, estrogen receptor positive, progesterone receptor negative, with an MIB-1-1 of 10%, and no HER-2 amplification.  (1) genetics 07/18/2018 through the [facility] Common Hereditary Cancers Panel found no deleterious mutations in APC, ATM, AXIN2, BARD1, BMPR1A, BRCA1, BRCA2, BRIP1, CDH1, CDKN2A (p14ARF), CDKN2A (p16INK4a), CKD4, CHEK2, CTNNA1, DICER1, EPCAM (Deletion/duplication testing only), GREM1 (promoter region deletion/duplication testing only), KIT, MEN1, MLH1, MSH2, MSH3, MSH6, MUTYH, NBN, NF1, NHTL1, PALB2, PDGFRA, PMS2, POLD1, POLE, PTEN, RAD50, RAD51C, RAD51D, SDHB, SDHC, SDHD, SMAD4, SMARCA4. STK11, TP53, TSC1, TSC2, and VHL.  The following genes were evaluated for sequence changes only: SDHA and HOXB13 c.251G>A variant only.   (2) status post left lumpectomy and sentinel lymph node sampling 08/22/2018 for a pT2 pN0, stage IB invasive ductal carcinoma, grade 2, with negative margins  (a) a total of 10 axillary lymph nodes were removed  (3) Oncotype score of 34 predicts a risk of recurrence outside the breast in the next 9 years of 22% if the patient's only systemic therapy is antiestrogens for 5 years.  It also predicts significant benefit from chemotherapy.  (4) adjuvant chemotherapy consisting of cyclophosphamide and docetaxel given every 21 days x 4 from 09/29/2018 through 11/30/2018  (5) adjuvant radiation 12/26/18 - 01/24/19 Site/dose:   The patient initially received a dose of 42.56 Gy in 16 fractions to the breast using whole-breast tangent  fields. This was delivered using  a 3-D conformal technique. The patient then received a boost to the seroma. This delivered an additional 8 Gy in 56fractions using a 3 field photon technique due to the depth of the seroma. The total dose was 50.56 Gy.  (6) tamoxifen started 01/24/2018  (7) right lumpectomy 04/16/2020 showed a complex sclerosing lesion, no evidence of malignancy.   PLAN: And is now close to 2 years out from definitive surgery for her breast cancer with no evidence of disease recurrence.  This is very favorable.  She is tolerating tamoxifen well and the plan will be to continue that a minimum of 5 years.  She just saw her surgeon who will see her again in 1 year.  Accordingly she will see [place] again in October and from that point we will start seeing her on a once a year basis  She knows to call for any other issue that may develop before then  Total encounter time 25 minutes.Sarajane Jews C. Odalys Win, MD Medical Oncology and Hematology [facility] [place], [place] [address] Tel. [phone]    Fax. [phone]   I, Wilburn Mylar, am acting as scribe for Dr. Virgie Dad. Cataleah Stites.  I, Lurline Del MD, have reviewed the above documentation for accuracy and completeness, and I agree with the above.   *Total Encounter Time as defined by the Centers for Medicare and Medicaid Services includes, in addition to the face-to-face time of a patient visit (documented in the note above) non-face-to-face time: obtaining and reviewing outside history, ordering and reviewing medications, tests or procedures, care coordination (communications with other health care professionals or caregivers) and documentation in the medical record.

## 2020-05-06 ENCOUNTER — Inpatient Hospital Stay: Payer: BC Managed Care – PPO

## 2020-05-06 ENCOUNTER — Other Ambulatory Visit: Payer: Self-pay

## 2020-05-06 ENCOUNTER — Inpatient Hospital Stay: Payer: BC Managed Care – PPO | Attending: Oncology | Admitting: Oncology

## 2020-05-06 VITALS — BP 117/62 | HR 87 | Temp 98.1°F | Resp 18 | Ht 66.5 in | Wt 222.7 lb

## 2020-05-06 DIAGNOSIS — Z801 Family history of malignant neoplasm of trachea, bronchus and lung: Secondary | ICD-10-CM | POA: Insufficient documentation

## 2020-05-06 DIAGNOSIS — Z9221 Personal history of antineoplastic chemotherapy: Secondary | ICD-10-CM | POA: Insufficient documentation

## 2020-05-06 DIAGNOSIS — I10 Essential (primary) hypertension: Secondary | ICD-10-CM | POA: Diagnosis not present

## 2020-05-06 DIAGNOSIS — Z7951 Long term (current) use of inhaled steroids: Secondary | ICD-10-CM | POA: Insufficient documentation

## 2020-05-06 DIAGNOSIS — Z79810 Long term (current) use of selective estrogen receptor modulators (SERMs): Secondary | ICD-10-CM | POA: Diagnosis not present

## 2020-05-06 DIAGNOSIS — Z833 Family history of diabetes mellitus: Secondary | ICD-10-CM | POA: Insufficient documentation

## 2020-05-06 DIAGNOSIS — E038 Other specified hypothyroidism: Secondary | ICD-10-CM

## 2020-05-06 DIAGNOSIS — E039 Hypothyroidism, unspecified: Secondary | ICD-10-CM | POA: Insufficient documentation

## 2020-05-06 DIAGNOSIS — Z803 Family history of malignant neoplasm of breast: Secondary | ICD-10-CM | POA: Insufficient documentation

## 2020-05-06 DIAGNOSIS — Z170 Estrogen receptor positive status [ER+]: Secondary | ICD-10-CM | POA: Diagnosis not present

## 2020-05-06 DIAGNOSIS — J45909 Unspecified asthma, uncomplicated: Secondary | ICD-10-CM | POA: Diagnosis not present

## 2020-05-06 DIAGNOSIS — C50412 Malignant neoplasm of upper-outer quadrant of left female breast: Secondary | ICD-10-CM | POA: Diagnosis not present

## 2020-05-06 DIAGNOSIS — Z8249 Family history of ischemic heart disease and other diseases of the circulatory system: Secondary | ICD-10-CM | POA: Insufficient documentation

## 2020-05-06 DIAGNOSIS — Z8042 Family history of malignant neoplasm of prostate: Secondary | ICD-10-CM | POA: Insufficient documentation

## 2020-05-06 DIAGNOSIS — J4541 Moderate persistent asthma with (acute) exacerbation: Secondary | ICD-10-CM

## 2020-05-06 DIAGNOSIS — Z923 Personal history of irradiation: Secondary | ICD-10-CM | POA: Insufficient documentation

## 2020-05-06 DIAGNOSIS — Z79899 Other long term (current) drug therapy: Secondary | ICD-10-CM | POA: Insufficient documentation

## 2020-05-06 LAB — CBC WITH DIFFERENTIAL/PLATELET
Abs Immature Granulocytes: 0.03 10*3/uL (ref 0.00–0.07)
Basophils Absolute: 0.1 10*3/uL (ref 0.0–0.1)
Basophils Relative: 1 %
Eosinophils Absolute: 0.5 10*3/uL (ref 0.0–0.5)
Eosinophils Relative: 5 %
HCT: 36.7 % (ref 36.0–46.0)
Hemoglobin: 12.2 g/dL (ref 12.0–15.0)
Immature Granulocytes: 0 %
Lymphocytes Relative: 25 %
Lymphs Abs: 2.2 10*3/uL (ref 0.7–4.0)
MCH: 27 pg (ref 26.0–34.0)
MCHC: 33.2 g/dL (ref 30.0–36.0)
MCV: 81.2 fL (ref 80.0–100.0)
Monocytes Absolute: 0.9 10*3/uL (ref 0.1–1.0)
Monocytes Relative: 11 %
Neutro Abs: 5 10*3/uL (ref 1.7–7.7)
Neutrophils Relative %: 58 %
Platelets: 251 10*3/uL (ref 150–400)
RBC: 4.52 MIL/uL (ref 3.87–5.11)
RDW: 13 % (ref 11.5–15.5)
WBC: 8.7 10*3/uL (ref 4.0–10.5)
nRBC: 0 % (ref 0.0–0.2)

## 2020-05-06 LAB — COMPREHENSIVE METABOLIC PANEL
ALT: 7 U/L (ref 0–44)
AST: 10 U/L — ABNORMAL LOW (ref 15–41)
Albumin: 3.8 g/dL (ref 3.5–5.0)
Alkaline Phosphatase: 62 U/L (ref 38–126)
Anion gap: 9 (ref 5–15)
BUN: 13 mg/dL (ref 6–20)
CO2: 28 mmol/L (ref 22–32)
Calcium: 9.3 mg/dL (ref 8.9–10.3)
Chloride: 105 mmol/L (ref 98–111)
Creatinine, Ser: 0.82 mg/dL (ref 0.44–1.00)
GFR, Estimated: >60 mL/min (ref >60)
Glucose, Bld: 121 mg/dL — ABNORMAL HIGH (ref 70–99)
Potassium: 3.5 mmol/L (ref 3.5–5.1)
Sodium: 142 mmol/L (ref 135–145)
Total Bilirubin: 0.3 mg/dL (ref 0.3–1.2)
Total Protein: 7.2 g/dL (ref 6.5–8.1)

## 2020-05-08 ENCOUNTER — Telehealth: Payer: Self-pay | Admitting: Oncology

## 2020-05-08 NOTE — Telephone Encounter (Signed)
Scheduled per 4/19 los. Called and spoke with pt confirmed 10/10 appts

## 2020-06-23 ENCOUNTER — Other Ambulatory Visit: Payer: Self-pay | Admitting: Allergy

## 2020-06-23 DIAGNOSIS — J4540 Moderate persistent asthma, uncomplicated: Secondary | ICD-10-CM

## 2020-07-03 ENCOUNTER — Encounter: Payer: Self-pay | Admitting: Oncology

## 2020-07-09 ENCOUNTER — Telehealth: Payer: Self-pay | Admitting: Allergy

## 2020-07-09 ENCOUNTER — Encounter: Payer: Self-pay | Admitting: Oncology

## 2020-07-09 NOTE — Telephone Encounter (Signed)
Left detailed message on patient's phone informing her that the referral has been placed to the requested location. Referral, demographics, last 2 office notes, lab results, and allergy testing faxed over to [facility] at [phone].

## 2020-07-09 NOTE — Telephone Encounter (Signed)
Please advise to referral to new allergist

## 2020-07-09 NOTE — Telephone Encounter (Signed)
Patient now lives in [place] and wanted to know if Dr. Nelva Bush can send a referral to [facility] since it is much closer to her home.  Please advise if referral can be sent.

## 2020-07-10 ENCOUNTER — Ambulatory Visit: Payer: BC Managed Care – PPO | Admitting: Allergy

## 2020-07-14 NOTE — Telephone Encounter (Signed)
Patient called back and wanted to make an appointment with our office on 10/10 since she will be in the area. Informed patient to call back in August since our nurse practitioners schedules are not out yet. Patient states she wanted to close the referral for [facility] and she will just make appointments on days she can drive to [place].

## 2020-08-14 NOTE — Progress Notes (Signed)
Follow Up Note  RE: Veronica Reilly MRN: [id number] DOB: 09/05/1965 Date of Office Visit: 08/15/2020  Referring provider: Orpah Clinton, MD Primary care provider: Leeroy Cha, MD  Chief Complaint: Asthma and Allergic Rhinitis   History of Present Illness: I had the pleasure of seeing Veronica Reilly for a follow up visit at the [facility] on 08/15/2020. She is a 55 y.o. female, who is being followed for asthma, allergic rhinoconjunctivitis and adverse drug reaction. Her previous allergy office visit was on 01/03/2020 with Dr. Nelva Reilly. Today is a regular follow up visit. She is accompanied today by her spouse who provided/contributed to the history.   Asthma ACT score 22.  Only using Symbicort as needed about once per month - mainly due to exertion related issues. Patient has not been using albuterol at all and using Symbicort as a rescue instead with good benefit.  Denies any ER/urgent care visits or prednisone use since the last visit.    Allergic rhinoconjunctivitis Currently taking zyrtec, Xyzal, or allegra daily - switches every 1-2 months. Uses Nasacort 2 sprays per nostril daily about 3-4 times per week with good benefit. No nosebleeds. Takes Singulair at night.  Only using eye drops as needed.   Patient lives in [place], [place] however has not found a Engineer, manufacturing systems yet.  Assessment and Plan: Veronica Reilly is a 55 y.o. female with: Moderate persistent asthma, uncomplicated Well-controlled with no daily inhalers. ACT score 22. Today's spirometry was normal. Daily controller medication(s): Continue Singulair (montelukast) '10mg'$  daily at night. During upper respiratory infections/asthma flares: start Symbicort 133mg 2 puffs twice a day with spacer and rinse mouth afterwards for 1-2 weeks until your breathing symptoms return to baseline.  May use albuterol rescue inhaler OR Symbicort 1619m 2 puffs every 4 to 6 hours as needed for shortness of  breath, chest tightness, coughing, and wheezing. May use albuterol rescue inhaler 2 puffs 5 to 15 minutes prior to strenuous physical activities. Monitor frequency of use.  Get spirometry at next visit.  Seasonal and perennial allergic rhinoconjunctivitis Past history - bloodwork in 2018 was positive to dust mites, cat, grass, cockroach, tree pollen. Interim history - stable with below meds. Continue environmental control measures.  Use over the counter antihistamines such as Zyrtec (cetirizine), Claritin (loratadine), Allegra (fexofenadine), or Xyzal (levocetirizine) daily as needed. May take twice a day during allergy flares. May switch antihistamines every few months. Continue Singulair (montelukast) '10mg'$  daily at night. Use Nasacort (triamcinolone) nasal spray 2 sprays per nostril once a day as needed for nasal congestion. Samples given. Use olopatadine eye drops 0.7% once a day as needed for itchy/watery eyes. Consider allergy injections for long term control if above medications do not help the symptoms.  Return in about 6 months (around 02/15/2021[place] with them.  Meds ordered this encounter  Medications   budesonide-formoterol (SYMBICORT) 160-4.5 MCG/ACT inhaler    Sig: Inhale 2 puffs into the lungs in the morning and at bedtime. with spacer and rinse mouth afterwards.    Dispense:  1 each    Refill:  5   montelukast (SINGULAIR) 10 MG tablet    Sig: Take 1 tablet (10 mg total) by mouth at bedtime.    Dispense:  30 tablet    Refill:  5   levocetirizine (XYZAL) 5 MG tablet    Sig: Take 1 tablet (5 mg total) by mouth  every evening.    Dispense:  30 tablet    Refill:  5   triamcinolone (NASACORT) 55 MCG/ACT AERO nasal inhaler    Sig: Place 2 sprays into the nose daily.    Dispense:  1 each    Refill:  5   Olopatadine HCl (PAZEO) 0.7 % SOLN    Sig: Apply 1 drop to eye daily as needed  (itchy/watery eyes).    Dispense:  2.5 mL    Refill:  5   EPINEPHrine 0.3 mg/0.3 mL IJ SOAJ injection    Sig: Use as directed for life threatening allergic reactions    Dispense:  2 each    Refill:  3    May dispense generic/Mylan/Teva brand.   albuterol (VENTOLIN HFA) 108 (90 Base) MCG/ACT inhaler    Sig: Use 2 puffs every four hours as needed for cough or wheeze.    Dispense:  18 g    Refill:  1    Keep on file, patient will call when ready    Lab Orders  No laboratory test(s) ordered today    Diagnostics: Spirometry:  Tracings reviewed. Her effort: Good reproducible efforts. FVC: 2.46L FEV1: 1.80L, 73% predicted FEV1/FVC ratio: 73% Interpretation: Spirometry consistent with normal pattern.  Please see scanned spirometry results for details.  Medication List:  Current Outpatient Medications  Medication Sig Dispense Refill   budesonide-formoterol (SYMBICORT) 160-4.5 MCG/ACT inhaler Inhale 2 puffs into the lungs in the morning and at bedtime. with spacer and rinse mouth afterwards. 1 each 5   Cholecalciferol (VITAMIN D3) 50 MCG (2000 UT) TABS Take 2,000 Units by mouth every evening.     folic acid (FOLVITE) 1 MG tablet Take 1 mg by mouth every evening.      hydrochlorothiazide (MICROZIDE) 12.5 MG capsule Take 12.5 mg by mouth daily.   1   levocetirizine (XYZAL) 5 MG tablet Take 1 tablet (5 mg total) by mouth every evening. 30 tablet 5   levothyroxine (SYNTHROID, LEVOTHROID) 112 MCG tablet Take 112 mcg by mouth daily before breakfast.      montelukast (SINGULAIR) 10 MG tablet Take 1 tablet (10 mg total) by mouth at bedtime. 30 tablet 5   NIFEdipine (PROCARDIA XL/ADALAT-CC) 60 MG 24 hr tablet Take 60 mg by mouth daily.  1   Olopatadine HCl (PAZEO) 0.7 % SOLN Apply 1 drop to eye daily as needed (itchy/watery eyes). 2.5 mL 5   tamoxifen (NOLVADEX) 20 MG tablet TAKE 1 TABLET(20 MG) BY MOUTH DAILY 90 tablet 2   triamcinolone (NASACORT) 55 MCG/ACT AERO nasal inhaler Place 2 sprays  into the nose daily. 1 each 5   albuterol (VENTOLIN HFA) 108 (90 Base) MCG/ACT inhaler Use 2 puffs every four hours as needed for cough or wheeze. 18 g 1   EPINEPHrine 0.3 mg/0.3 mL IJ SOAJ injection Use as directed for life threatening allergic reactions 2 each 3   esomeprazole (NEXIUM) 20 MG capsule Take 20 mg by mouth daily at 12 noon.     Current Facility-Administered Medications  Medication Dose Route Frequency Provider Last Rate Last Admin   EPINEPHrine (EPI-PEN) injection 0.3 mg  0.3 mg Intramuscular Once Kennith Gain, MD       Allergies: Allergies  Allergen Reactions   Apple Anaphylaxis   Iohexol     Contrast dye   Other Shortness Of Breath    CHERRIES, TREES, MOLD,DUST, COCKROACHES   Povidone-Iodine Anaphylaxis   Shellfish Allergy Anaphylaxis   Peach [Prunus Persica] Itching and Hives  mouth   Zocor [Simvastatin] Other (See Comments)    Alopecia   Betadine [Povidone Iodine]     Due to shellfish allergy   Effexor [Venlafaxine] Swelling   Wound Dressing Adhesive    Latex Rash   I reviewed her past medical history, social history, family history, and environmental history and no significant changes have been reported from her previous visit.  Review of Systems  Constitutional:  Negative for appetite change, chills, fever and unexpected weight change.  HENT:  Negative for congestion and rhinorrhea.   Eyes:  Negative for itching.  Respiratory:  Negative for cough, chest tightness, shortness of breath and wheezing.   Gastrointestinal:  Negative for abdominal pain.  Skin:  Negative for rash.  Allergic/Immunologic: Positive for environmental allergies.  Neurological:  Negative for headaches.   Objective: BP 110/78   Pulse 84   Resp 16   SpO2 100%  There is no height or weight on file to calculate BMI. Physical Exam Vitals and nursing note reviewed.  Constitutional:      Appearance: Normal appearance. She is well-developed.  HENT:     Head:  Normocephalic and atraumatic.     Right Ear: External ear normal.     Left Ear: External ear normal.     Nose: Nose normal.     Mouth/Throat:     Mouth: Mucous membranes are moist.     Pharynx: Oropharynx is clear.  Eyes:     Conjunctiva/sclera: Conjunctivae normal.  Cardiovascular:     Rate and Rhythm: Normal rate and regular rhythm.     Heart sounds: Normal heart sounds. No murmur heard. Pulmonary:     Effort: Pulmonary effort is normal.     Breath sounds: Normal breath sounds. No wheezing, rhonchi or rales.  Musculoskeletal:     Cervical back: Neck supple.  Skin:    General: Skin is warm.     Findings: No rash.  Neurological:     Mental Status: She is alert and oriented to person, place, and time.  Psychiatric:        Behavior: Behavior normal.   Previous notes and tests were reviewed. The plan was reviewed with the patient/family, and all questions/concerned were addressed.  It was my pleasure to see Veronica Reilly today and participate in her care. Please feel free to contact me with any questions or concerns.  Sincerely,  Rexene Alberts, DO Allergy & Immunology  [facility] office: [place] office: [phone]

## 2020-08-15 ENCOUNTER — Encounter: Payer: Self-pay | Admitting: Allergy

## 2020-08-15 ENCOUNTER — Other Ambulatory Visit: Payer: Self-pay

## 2020-08-15 ENCOUNTER — Other Ambulatory Visit: Payer: Self-pay | Admitting: *Deleted

## 2020-08-15 ENCOUNTER — Ambulatory Visit: Payer: BC Managed Care – PPO | Admitting: Allergy

## 2020-08-15 VITALS — BP 110/78 | HR 84 | Resp 16

## 2020-08-15 DIAGNOSIS — J302 Other seasonal allergic rhinitis: Secondary | ICD-10-CM

## 2020-08-15 DIAGNOSIS — J4540 Moderate persistent asthma, uncomplicated: Secondary | ICD-10-CM | POA: Diagnosis not present

## 2020-08-15 DIAGNOSIS — H1013 Acute atopic conjunctivitis, bilateral: Secondary | ICD-10-CM | POA: Diagnosis not present

## 2020-08-15 DIAGNOSIS — J3089 Other allergic rhinitis: Secondary | ICD-10-CM

## 2020-08-15 DIAGNOSIS — H1010 Acute atopic conjunctivitis, unspecified eye: Secondary | ICD-10-CM

## 2020-08-15 DIAGNOSIS — T50905D Adverse effect of unspecified drugs, medicaments and biological substances, subsequent encounter: Secondary | ICD-10-CM

## 2020-08-15 MED ORDER — BUDESONIDE-FORMOTEROL FUMARATE 160-4.5 MCG/ACT IN AERO: 2.0000 | INHALATION_SPRAY | Freq: Two times a day (BID) | RESPIRATORY_TRACT | 5 refills | Status: DC

## 2020-08-15 MED ORDER — EPINEPHRINE 0.3 MG/0.3ML IJ SOAJ: INTRAMUSCULAR | 3 refills | Status: DC

## 2020-08-15 MED ORDER — MONTELUKAST SODIUM 10 MG PO TABS: 10.0000 mg | ORAL_TABLET | Freq: Every day | ORAL | 5 refills | Status: DC

## 2020-08-15 MED ORDER — LEVOCETIRIZINE DIHYDROCHLORIDE 5 MG PO TABS: 5.0000 mg | ORAL_TABLET | Freq: Every evening | ORAL | 5 refills | Status: DC

## 2020-08-15 MED ORDER — TRIAMCINOLONE ACETONIDE 55 MCG/ACT NA AERO: 2.0000 | INHALATION_SPRAY | Freq: Every day | NASAL | 5 refills | Status: DC

## 2020-08-15 MED ORDER — OLOPATADINE HCL 0.2 % OP SOLN: 1.0000 [drp] | Freq: Every day | OPHTHALMIC | 5 refills | Status: DC | PRN

## 2020-08-15 MED ORDER — PAZEO 0.7 % OP SOLN: 1.0000 [drp] | Freq: Every day | OPHTHALMIC | 5 refills | Status: DC | PRN

## 2020-08-15 MED ORDER — ALBUTEROL SULFATE HFA 108 (90 BASE) MCG/ACT IN AERS: INHALATION_SPRAY | RESPIRATORY_TRACT | 1 refills | Status: DC

## 2020-08-15 NOTE — Assessment & Plan Note (Signed)
Past history - bloodwork in 2018 was positive to dust mites, cat, grass, cockroach, tree pollen. Interim history - stable with below meds. . Continue environmental control measures.  . Use over the counter antihistamines such as Zyrtec (cetirizine), Claritin (loratadine), Allegra (fexofenadine), or Xyzal (levocetirizine) daily as needed. May take twice a day during allergy flares. May switch antihistamines every few months. . Continue Singulair (montelukast) '10mg'$  daily at night. . Use Nasacort (triamcinolone) nasal spray 2 sprays per nostril once a day as needed for nasal congestion. Samples given. . Use olopatadine eye drops 0.7% once a day as needed for itchy/watery eyes. . Consider allergy injections for long term control if above medications do not help the symptoms.

## 2020-08-15 NOTE — Patient Instructions (Addendum)
Asthma Daily controller medication(s):  Continue Singulair (montelukast) '10mg'$  daily at night. During upper respiratory infections/asthma flares: start Symbicort 118mg 2 puffs twice a day with spacer and rinse mouth afterwards for 1-2 weeks until your breathing symptoms return to baseline.  May use albuterol rescue inhaler OR Symbicort 2 puffs every 4 to 6 hours as needed for shortness of breath, chest tightness, coughing, and wheezing. May use albuterol rescue inhaler 2 puffs 5 to 15 minutes prior to strenuous physical activities. Monitor frequency of use.  Asthma control goals:  Full participation in all desired activities (may need albuterol before activity) Albuterol use two times or less a week on average (not counting use with activity) Cough interfering with sleep two times or less a month Oral steroids no more than once a year No hospitalizations   Allergic rhinoconjunctivitis Testing in 2018 was positive to dust mites, cat, grass, cockroach, tree pollen. Continue environmental control measures.  Use over the counter antihistamines such as Zyrtec (cetirizine), Claritin (loratadine), Allegra (fexofenadine), or Xyzal (levocetirizine) daily as needed. May take twice a day during allergy flares. May switch antihistamines every few months. Continue Singulair (montelukast) '10mg'$  daily at night. Use Nasacort (triamcinolone) nasal spray 2 sprays per nostril once a day as needed for nasal congestion. Samples given. Use olopatadine eye drops 0.7% once a day as needed for itchy/watery eyes. Consider allergy injections for long term control if above medications do not help the symptoms.   [place] [facility] [place].  Control of House Dust Mite Allergen Dust mite allergens are a common trigger of allergy and asthma symptoms. While they can be found throughout the house, these microscopic  creatures thrive in warm, humid environments such as bedding, upholstered furniture and carpeting. Because so much time is spent in the bedroom, it is essential to reduce mite levels there.  Encase pillows, mattresses, and box springs in special allergen-proof fabric covers or airtight, zippered plastic covers.  Bedding should be washed weekly in hot water (130 F) and dried in a hot dryer. Allergen-proof covers are available for comforters and pillows that can't be regularly washed.  Wash the allergy-proof covers every few months. Minimize clutter in the bedroom. Keep pets out of the bedroom.  Keep humidity less than 50% by using a dehumidifier or air conditioning. You can buy a humidity measuring device called a hygrometer to monitor this.  If possible, replace carpets with hardwood, linoleum, or washable area rugs. If that's not possible, vacuum frequently with a vacuum that has a HEPA filter. Remove all upholstered furniture and non-washable window drapes from the bedroom. Remove all non-washable stuffed toys from the bedroom.  Wash stuffed toys weekly. Pet Allergen Avoidance: Contrary to popular opinion, there are no "hypoallergenic" breeds of dogs or cats. That is because people are not allergic to an animal's hair, but to an allergen found in the animal's saliva, dander (dead skin flakes) or urine. Pet allergy symptoms typically occur within minutes. For some people, symptoms can build up and become most severe 8 to 12 hours after contact with the animal. People with severe allergies can experience reactions in public places if dander has been transported on the pet owners' clothing. Keeping an animal outdoors is only a partial solution, since homes with pets in the yard still have higher concentrations of animal allergens. Before getting a pet, ask your allergist to determine if  you are allergic to animals. If your pet is already considered part of your family, try to minimize contact and keep  the pet out of the bedroom and other rooms where you spend a great deal of time. As with dust mites, vacuum carpets often or replace carpet with a hardwood floor, tile or linoleum. High-efficiency particulate air (HEPA) cleaners can reduce allergen levels over time. While dander and saliva are the source of cat and dog allergens, urine is the source of allergens from rabbits, hamsters, mice and [place] pigs; so ask a non-allergic family member to clean the animal's cage. If you have a pet allergy, talk to your allergist about the potential for allergy immunotherapy (allergy shots). This strategy can often provide long-term relief. Cockroach Allergen Avoidance Cockroaches are often found in the homes of densely populated urban areas, schools or commercial buildings, but these creatures can lurk almost anywhere. This does not mean that you have a dirty house or living area. Block all areas where roaches can enter the home. This includes crevices, wall cracks and windows.  Cockroaches need water to survive, so fix and seal all leaky faucets and pipes. Have an exterminator go through the house when your family and pets are gone to eliminate any remaining roaches. Keep food in lidded containers and put pet food dishes away after your pets are done eating. Vacuum and sweep the floor after meals, and take out garbage and recyclables. Use lidded garbage containers in the kitchen. Wash dishes immediately after use and clean under stoves, refrigerators or toasters where crumbs can accumulate. Wipe off the stove and other kitchen surfaces and cupboards regularly. Reducing Pollen Exposure Pollen seasons: trees (spring), grass (summer) and ragweed/weeds (fall). Keep windows closed in your home and car to lower pollen exposure.  Install air conditioning in the bedroom and throughout the house if possible.  Avoid going out in dry windy days - especially early morning. Pollen counts are highest between 5 - 10 AM and  on dry, hot and windy days.  Save outside activities for late afternoon or after a heavy rain, when pollen levels are lower.  Avoid mowing of grass if you have grass pollen allergy. Be aware that pollen can also be transported indoors on people and pets.  Dry your clothes in an automatic dryer rather than hanging them outside where they might collect pollen.  Rinse hair and eyes before bedtime.

## 2020-08-15 NOTE — Assessment & Plan Note (Signed)
Well-controlled with no daily inhalers.  ACT score 22.  Today's spirometry was normal. . Daily controller medication(s): o Continue Singulair (montelukast) '10mg'$  daily at night. . During upper respiratory infections/asthma flares: start Symbicort 149mg 2 puffs twice a day with spacer and rinse mouth afterwards for 1-2 weeks until your breathing symptoms return to baseline.  . May use albuterol rescue inhaler OR Symbicort 1634m 2 puffs every 4 to 6 hours as needed for shortness of breath, chest tightness, coughing, and wheezing. May use albuterol rescue inhaler 2 puffs 5 to 15 minutes prior to strenuous physical activities. Monitor frequency of use.  . Get spirometry at next visit.

## 2020-08-18 ENCOUNTER — Telehealth: Payer: Self-pay | Admitting: Allergy

## 2020-08-18 MED ORDER — PATADAY 0.7 % OP SOLN: 1.0000 [drp] | Freq: Every day | OPHTHALMIC | 5 refills | Status: AC | PRN

## 2020-08-18 NOTE — Telephone Encounter (Signed)
Please advise 

## 2020-08-18 NOTE — Telephone Encounter (Signed)
Pharmacy states Pazeo is no longer in use, no being made. They can substitute with Pataday 0.7%.

## 2020-08-18 NOTE — Telephone Encounter (Signed)
Sent in new rx 

## 2020-08-29 ENCOUNTER — Other Ambulatory Visit: Payer: Self-pay | Admitting: Oncology

## 2020-08-29 DIAGNOSIS — Z9889 Other specified postprocedural states: Secondary | ICD-10-CM

## 2020-10-27 ENCOUNTER — Inpatient Hospital Stay: Payer: BC Managed Care – PPO | Admitting: Oncology

## 2020-10-27 ENCOUNTER — Inpatient Hospital Stay: Payer: BC Managed Care – PPO

## 2020-10-31 ENCOUNTER — Ambulatory Visit
Admission: RE | Admit: 2020-10-31 | Discharge: 2020-10-31 | Disposition: A | Discharge status: Home / Self Care | Payer: BC Managed Care – PPO | Source: Ambulatory Visit | Attending: Oncology | Admitting: Oncology

## 2020-10-31 ENCOUNTER — Encounter: Payer: Self-pay | Admitting: Oncology

## 2020-10-31 ENCOUNTER — Other Ambulatory Visit: Payer: Self-pay

## 2020-10-31 DIAGNOSIS — Z9889 Other specified postprocedural states: Secondary | ICD-10-CM

## 2020-11-24 NOTE — Progress Notes (Signed)
[place]  Telephone:([phone]) [phone] Fax:(336) [phone]    ID: Veronica Reilly DOB: Apr 22, 1965  MR#: [id number]  ZES#:[id number]  Patient Care Team: Leeroy Cha, MD as PCP - General (Internal Medicine) Tawona Filsinger, Virgie Dad, MD as Consulting Physician (Oncology) Kyung Rudd, MD as Consulting Physician (Radiation Oncology) Erroll Luna, MD as Consulting Physician (General Surgery) Delsa Bern, MD as Consulting Physician (Obstetrics and Gynecology) Kennith Gain, MD as Consulting Physician (Allergy) Delrae Rend, MD as Consulting Physician (Endocrinology) Juanita Craver, MD as Consulting Physician (Gastroenterology) Chauncey Cruel, MD OTHER MD:  CHIEF COMPLAINT: estrogen receptor positive breast cancer  CURRENT TREATMENT: tamoxifen   INTERVAL HISTORY: Veronica Reilly" returns today for follow up of her estrogen receptor positive breast cancer.  She is accompanied by her husband.  She continues on tamoxifen.  She d tolerates this generally well, and now that she takes it in the evening she has had fewer problems with hot flashes.  Since her last visit, she underwent bilateral diagnostic mammography with tomography at [facility] on 10/31/2020 showing: breast density category C; no evidence of malignancy in either breast.    REVIEW OF SYSTEMS: Veronica "Veronica Reilly" is now living in [place] and working there.  She really likes her job.  However her husband has severe renal insufficiency and is on the transplant list at [facility] so eventually they are going to move to the [place] area.  However she wishes to continue her care here and does not wish to change her breast cancer care to [facility] or locally in [place].   COVID 19 VACCINATION STATUS: Moderna x3, infection 02/2020     HISTORY OF CURRENT ILLNESS: From the original intake note:  Veronica Reilly had some calcifications in the left breast noted March 2019 on screening  mammography.. She underwent biopsy of the left retroareolar area of concern on 03/18/2017 with pathology ([id number]) showing fibrocystic changes with no malignancy. Return in 6 months for left diagnostic mammography and ultrasonography was recommended, and she reports this took place in November 2019 at her gynecologist's office and the changes previously noted were stable.  She underwent bilateral diagnostic mammography with tomography and left breast ultrasonography at [facility] on 06/27/2018 showing: breast density category C; left breast upper-outer quadrant 3.1 cm group of indeterminate calcifications; persistent benign-appearing cyst in the subareolar left breast, post core needle biopsy changes.  On physical exam there were no palpable masses.  Targeted ultrasound revealed no suspicious masses.  There was a left subareolar breast cyst measuring 1.0 cm.  Left axilla was unremarkable.  Accordingly on 06/29/2018 she proceeded to biopsy of the left breast calcifications area in question. The pathology from this procedure ([id number]) showed: invasive ductal carcinoma, grade 2, with ductal carcinoma in situ. Prognostic indicators significant for: estrogen receptor, 100% positive with strong staining intensity and progesterone receptor, 0% negative. Proliferation marker Ki67 at 10%. HER2 negtive by immunohistochemistry, (0).  The patient's subsequent history is as detailed below.   PAST MEDICAL HISTORY: Past Medical History:  Diagnosis Date   ASCUS (atypical squamous cells of undetermined significance) on Pap smear    Asthma    controlled with daily inhaler   Breast cancer ([place]) 06/29/2018   Left IDC   Dysplasia of cervix, low grade (CIN 1)    Eczema    Family history of breast cancer    Family history of lung cancer    Family history of prostate cancer    GERD (gastroesophageal reflux disease)  Heart murmur    History of chicken pox    Hypertension    Hypothyroid    Personal  history of chemotherapy    Personal history of radiation therapy    Radial scar of right breast    Uterine fibroid   Heart murmur, evaluated by cardiologist   PAST SURGICAL HISTORY: Past Surgical History:  Procedure Laterality Date   ADENOIDECTOMY     BREAST BIOPSY Left 06/2018   BREAST BIOPSY Right 10/2019   x2   BREAST EXCISIONAL BIOPSY Right 03/2020   BREAST LUMPECTOMY Left 08/22/2018   BREAST LUMPECTOMY WITH RADIOACTIVE SEED AND SENTINEL LYMPH NODE BIOPSY Left 08/22/2018   Procedure: LEFT BREAST LUMPECTOMY WITH RADIOACTIVE SEED AND SENTINEL LYMPH NODE MAPPING;  Surgeon: Erroll Luna, MD;  Location: [place];  Service: General;  Laterality: Left;   BREAST LUMPECTOMY WITH RADIOACTIVE SEED LOCALIZATION Right 04/16/2020   Procedure: RIGHT BREAST LUMPECTOMY WITH RADIOACTIVE SEED LOCALIZATION;  Surgeon: Erroll Luna, MD;  Location: [place];  Service: General;  Laterality: Right;   LEEP     MYOMECTOMY     PORT-A-CATH REMOVAL N/A 03/13/2019   Procedure: PORT REMOVAL;  Surgeon: Erroll Luna, MD;  Location: [place];  Service: General;  Laterality: N/A;   PORTACATH PLACEMENT Right 09/27/2018   Procedure: INSERTION PORT-A-CATH WITH ULTRASOUND;  Surgeon: Erroll Luna, MD;  Location: [facility];  Service: General;  Laterality: Right;   TONSILECTOMY, ADENOIDECTOMY, BILATERAL MYRINGOTOMY AND TUBES     TONSILLECTOMY      FAMILY HISTORY: Family History  Problem Relation Age of Onset   Breast cancer Maternal Grandmother    Heart disease Father    Diabetes Father    Prostate cancer Father 60   Breast cancer Mother 14   Hypertension Other    Lung cancer Maternal Aunt    Allergic rhinitis Neg Hx    Angioedema Neg Hx    Asthma Neg Hx    Eczema Neg Hx    Immunodeficiency Neg Hx    Urticaria Neg Hx   Patient's father was 25 years old when he died from diabetes and renal failure. Patient's mother died from breast cancer at age 19.  The patient notes a family hx of breast and possibly ovarian cancer. Her mother was diagnosed with inflammatory breast cancer at around age 42 (she was a patient here), and the patient maternal grandmother was also diagnosed with breast cancer and died at age 50.  The patient has 1 brother, no sisters. She also had one maternal aunt with lung cancer and a history of smoking, her father with prostate cancer at age 31, and a possible GYN cancer in a paternal aunt.   GYNECOLOGIC HISTORY:  Last menstrual period February 2020  menarche: 55 years old GX P 0, she has had fertility treatments LMP 05/2018, lasted 3 days, irregular (s/p ablation) Contraceptive: used for apprx. 6 months HRT never used  Hysterectomy? no BSO? 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 Husband Celesta Gentile is a trauma nurse at [employer].  He has severe renal insufficiency and is on a kidney transplant list.  She lives at home with her husband and some hermit crabs and salt and fresh water fish. 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  ADVANCED DIRECTIVES: Husband Celesta Gentile is her HCPOA.   HEALTH MAINTENANCE: Social History   Tobacco Use   Smoking status: Never   Smokeless tobacco: Never  Substance Use Topics   Alcohol use: No   Drug use: No     Colonoscopy: Pending  PAP: Up-to-date  Bone density: never done   Allergies  Allergen Reactions   Apple Anaphylaxis   Iohexol     Contrast dye   Other Shortness Of Breath    CHERRIES, TREES, MOLD,DUST, COCKROACHES   Povidone-Iodine Anaphylaxis   Shellfish Allergy Anaphylaxis   Peach [Prunus Persica] Itching and Hives    mouth   Zocor [Simvastatin] Other (See Comments)    Alopecia   Betadine [Povidone Iodine]     Due to  shellfish allergy   Effexor [Venlafaxine] Swelling   Valsartan Hives    Other reaction(s): hives, itching   Wound Dressing Adhesive    Latex Rash    Current Outpatient Medications  Medication Sig Dispense Refill   albuterol (VENTOLIN HFA) 108 (90 Base) MCG/ACT inhaler Use 2 puffs every four hours as needed for cough or wheeze. 18 g 1   budesonide-formoterol (SYMBICORT) 160-4.5 MCG/ACT inhaler Inhale 2 puffs into the lungs in the morning and at bedtime. with spacer and rinse mouth afterwards. 1 each 5   Cholecalciferol (VITAMIN D3) 50 MCG (2000 UT) TABS Take 2,000 Units by mouth every evening.     EPINEPHrine 0.3 mg/0.3 mL IJ SOAJ injection Use as directed for life threatening allergic reactions 2 each 3   esomeprazole (NEXIUM) 20 MG capsule Take 20 mg by mouth daily at 12 noon.     folic acid (FOLVITE) 1 MG tablet Take 1 mg by mouth every evening.      hydrochlorothiazide (MICROZIDE) 12.5 MG capsule Take 12.5 mg by mouth daily.   1   levocetirizine (XYZAL) 5 MG tablet Take 1 tablet (5 mg total) by mouth every evening. 30 tablet 5   levothyroxine (SYNTHROID, LEVOTHROID) 112 MCG tablet Take 112 mcg by mouth daily before breakfast.      montelukast (SINGULAIR) 10 MG tablet Take 1 tablet (10 mg total) by mouth at bedtime. 30 tablet 5   NIFEdipine (PROCARDIA XL/ADALAT-CC) 60 MG 24 hr tablet Take 60 mg by mouth daily.  1   Olopatadine HCl (PATADAY) 0.7 % SOLN Apply 1 drop to eye daily as needed (itchy/watery eyes). 2.5 mL 5   tamoxifen (NOLVADEX) 20 MG tablet TAKE 1 TABLET(20 MG) BY MOUTH DAILY 90 tablet 2   triamcinolone (NASACORT) 55 MCG/ACT AERO nasal inhaler Place 2 sprays into the nose daily. 1 each 5   Current Facility-Administered Medications  Medication Dose Route Frequency Provider Last Rate Last Admin   EPINEPHrine (EPI-PEN) injection 0.3 mg  0.3 mg Intramuscular Once Kennith Gain, MD        OBJECTIVE: African-American woman   Vitals:   11/25/20 1049  BP: 121/73   Pulse: 82  Resp: 16  Temp: 97.7 F (36.5 C)  SpO2: 100%     Body mass index is 35.99 kg/m.   Wt Readings from Last 3 Encounters:  11/25/20 223 lb (101.2 kg)  05/06/20 222 lb 11.2 oz (101 kg)  04/16/20 227 lb 4.7 oz (103.1 kg)  ECOG FS:1 - Symptomatic but completely ambulatory  Sclerae unicteric, EOMs intact Wearing a mask No cervical or supraclavicular adenopathy Lungs no rales or rhonchi Heart regular rate and rhythm Abd soft, nontender, positive bowel sounds MSK no focal spinal tenderness, no upper extremity  lymphedema Neuro: nonfocal, well oriented, appropriate affect Breasts: The right breast is status postlumpectomy and radiation.  There is no evidence of local recurrence.  The left breast and both axillae are benign.   LAB RESULTS:  CMP     Component Value Date/Time   NA 142 05/06/2020 1436   K 3.5 05/06/2020 1436   CL 105 05/06/2020 1436   CO2 28 05/06/2020 1436   GLUCOSE 121 (H) 05/06/2020 1436   BUN 13 05/06/2020 1436   CREATININE 0.82 05/06/2020 1436   CREATININE 0.81 05/03/2019 1020   CALCIUM 9.3 05/06/2020 1436   PROT 7.2 05/06/2020 1436   ALBUMIN 3.8 05/06/2020 1436   AST 10 (L) 05/06/2020 1436   AST 10 (L) 05/03/2019 1020   ALT 7 05/06/2020 1436   ALT 8 05/03/2019 1020   ALKPHOS 62 05/06/2020 1436   BILITOT 0.3 05/06/2020 1436   BILITOT 0.5 05/03/2019 1020   GFRNONAA >60 05/06/2020 1436   GFRNONAA >60 05/03/2019 1020   GFRAA >60 05/03/2019 1020    No results found for: TOTALPROTELP, ALBUMINELP, A1GS, A2GS, BETS, BETA2SER, GAMS, MSPIKE, SPEI  No results found for: KPAFRELGTCHN, LAMBDASER, KAPLAMBRATIO  Lab Results  Component Value Date   WBC 7.8 11/25/2020   NEUTROABS 4.6 11/25/2020   HGB 12.3 11/25/2020   HCT 36.6 11/25/2020   MCV 78.5 (L) 11/25/2020   PLT 263 11/25/2020   No results found for: LABCA2  No components found for: [id number]  No results for input(s): INR in the last 168 hours.  No results found for: LABCA2  No  results found for: WUJ811  No results found for: BJY782  No results found for: NFA213  No results found for: CA2729  No components found for: HGQUANT  No results found for: CEA1 / No results found for: CEA1   No results found for: AFPTUMOR  No results found for: CHROMOGRNA  No results found for: HGBA, HGBA2QUANT, HGBFQUANT, HGBSQUAN (Hemoglobinopathy evaluation)   No results found for: LDH  Lab Results  Component Value Date   IRON 54 10/29/2019   TIBC 285 10/29/2019   IRONPCTSAT 19 (L) 10/29/2019   (Iron and TIBC)  Lab Results  Component Value Date   FERRITIN 103 10/29/2019    Urinalysis    Component Value Date/Time   COLORURINE YELLOW 05/15/2007 0957   APPEARANCEUR CLEAR 05/15/2007 0957   LABSPEC 1.010 05/15/2007 0957   PHURINE 8.0 05/15/2007 0957   GLUCOSEU NEGATIVE 05/15/2007 0957   HGBUR NEGATIVE 05/15/2007 0957   BILIRUBINUR NEGATIVE 05/15/2007 0957   KETONESUR NEGATIVE 05/15/2007 0957   PROTEINUR NEGATIVE 05/15/2007 0957   UROBILINOGEN 0.2 05/15/2007 0957   NITRITE NEGATIVE 05/15/2007 0957   LEUKOCYTESUR  05/15/2007 0957    NEGATIVE MICROSCOPIC NOT DONE ON URINES WITH NEGATIVE PROTEIN, BLOOD, LEUKOCYTES, NITRITE, OR GLUCOSE <1000 mg/dL.    STUDIES: MM DIAG BREAST TOMO BILATERAL  Result Date: 10/31/2020 CLINICAL DATA:  History of LEFT breast cancer in 2020 status post lumpectomy and radiation therapy. History of RIGHT breast complex sclerosing lesion status post excision in 2022. EXAM: DIGITAL DIAGNOSTIC BILATERAL MAMMOGRAM WITH TOMOSYNTHESIS AND CAD TECHNIQUE: Bilateral digital diagnostic mammography and breast tomosynthesis was performed. The images were evaluated with computer-aided detection. COMPARISON:  Previous exam(s). ACR Breast Density Category c: The breast tissue is heterogeneously dense, which may obscure small masses. FINDINGS: There are stable postsurgical changes within the LEFT breast. There are expected postsurgical changes within the  RIGHT breast. There are no new dominant masses, suspicious calcifications or secondary signs  of malignancy within either breast. IMPRESSION: No evidence of malignancy within either breast. Stable postsurgical changes within the LEFT breast. RECOMMENDATION: 1.  Screening mammogram in one year.(Code:[id number]-B-01Y) 2. Per protocol, as the patient is now 2 or more years status post lumpectomy, she may return to annual screening mammography in 1 year. However, given the history of breast cancer, the patient remains eligible for annual diagnostic mammography if preferred. I have discussed the findings and recommendations with the patient. If applicable, a reminder letter will be sent to the patient regarding the next appointment. BI-RADS CATEGORY  2: Benign. Electronically Signed   By: Franki Cabot M.D.   On: 10/31/2020 13:57      ELIGIBLE FOR AVAILABLE RESEARCH PROTOCOL: no  Hemoglobin electrophoresis 10/29/2019 is consistent with sickle cell trait (56.6% hemoglobin A, 2.8% hemoglobin A 2, 40.6% hemoglobin S, 0% hemoglobin F).  (a) ferritin 103 with saturation 19% and MCV 79.4 on 10/29/2019.  ASSESSMENT: 55 y.o.  Veronica Reilly woman status post left breast upper outer quadrant biopsy 06/29/2018 for a clinical T2N0 invasive ductal carcinoma, grade 2, estrogen receptor positive, progesterone receptor negative, with an MIB-1-1 of 10%, and no HER-2 amplification.  (1) genetics 07/18/2018 through the [facility] Common Hereditary Cancers Panel found no deleterious mutations in APC, ATM, AXIN2, BARD1, BMPR1A, BRCA1, BRCA2, BRIP1, CDH1, CDKN2A (p14ARF), CDKN2A (p16INK4a), CKD4, CHEK2, CTNNA1, DICER1, EPCAM (Deletion/duplication testing only), GREM1 (promoter region deletion/duplication testing only), KIT, MEN1, MLH1, MSH2, MSH3, MSH6, MUTYH, NBN, NF1, NHTL1, PALB2, PDGFRA, PMS2, POLD1, POLE, PTEN, RAD50, RAD51C, RAD51D, SDHB, SDHC, SDHD, SMAD4, SMARCA4. STK11, TP53, TSC1, TSC2, and VHL.  The following genes were evaluated  for sequence changes only: SDHA and HOXB13 c.251G>A variant only.   (2) status post left lumpectomy and sentinel lymph node sampling 08/22/2018 for a pT2 pN0, stage IB invasive ductal carcinoma, grade 2, with negative margins  (a) a total of 10 axillary lymph nodes were removed  (3) Oncotype score of 34 predicts a risk of recurrence outside the breast in the next 9 years of 22% if the patient's only systemic therapy is antiestrogens for 5 years.  It also predicts significant benefit from chemotherapy.  (4) adjuvant chemotherapy consisting of cyclophosphamide and docetaxel given every 21 days x 4 from 09/29/2018 through 11/30/2018  (5) adjuvant radiation 12/26/18 - 01/24/19  Site/dose:   The patient initially received a dose of 42.56 Gy in 16 fractions to the breast using whole-breast tangent fields. This was delivered using a 3-D conformal technique. The patient then received a boost to the seroma. This delivered an additional 8 Gy in 48factions using a 3 field photon technique due to the depth of the seroma. The total dose was 50.56 Gy.  (6) tamoxifen started 01/24/2018  (7) right lumpectomy 04/16/2020 showed a complex sclerosing lesion, no evidence of malignancy.   PLAN: ALelon Frohlichis now a little over 2 years out from definitive surgery for her right breast cancer.  There is no evidence of local recurrence.  This is very favorable.  She is tolerating tamoxifen well and the plan is to continue that a minimum of 5 years.  She is under a great deal of stress because of work situations, travel, and her husband's situation.  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  Total encounter time 25 minutes.Sarajane Jews C. Marisa Hufstetler, MD Medical Oncology and Hematology [facility] [place], [place] [address] Tel. [phone]    Fax. [phone]   I, Wilburn Mylar, am acting as scribe for Dr. Virgie Dad. Dinorah Masullo.  I, Lurline Del MD, have reviewed the above documentation for accuracy and completeness, and I agree with the above.   *Total Encounter Time as defined by the Centers for Medicare and Medicaid Services includes, in addition to the face-to-face time of a patient visit (documented in the note above) non-face-to-face time: obtaining and reviewing outside history, ordering and reviewing medications, tests or procedures, care coordination (communications with other health care professionals or caregivers) and documentation in the medical record.

## 2020-11-25 ENCOUNTER — Other Ambulatory Visit: Payer: Self-pay

## 2020-11-25 ENCOUNTER — Inpatient Hospital Stay: Payer: BLUE CROSS/BLUE SHIELD | Attending: Oncology

## 2020-11-25 ENCOUNTER — Inpatient Hospital Stay (HOSPITAL_BASED_OUTPATIENT_CLINIC_OR_DEPARTMENT_OTHER): Payer: BLUE CROSS/BLUE SHIELD | Admitting: Oncology

## 2020-11-25 VITALS — BP 121/73 | HR 82 | Temp 97.7°F | Resp 16 | Ht 66.0 in | Wt 223.0 lb

## 2020-11-25 DIAGNOSIS — Z170 Estrogen receptor positive status [ER+]: Secondary | ICD-10-CM | POA: Insufficient documentation

## 2020-11-25 DIAGNOSIS — Z801 Family history of malignant neoplasm of trachea, bronchus and lung: Secondary | ICD-10-CM | POA: Insufficient documentation

## 2020-11-25 DIAGNOSIS — C50412 Malignant neoplasm of upper-outer quadrant of left female breast: Secondary | ICD-10-CM | POA: Insufficient documentation

## 2020-11-25 DIAGNOSIS — Z8042 Family history of malignant neoplasm of prostate: Secondary | ICD-10-CM | POA: Diagnosis not present

## 2020-11-25 DIAGNOSIS — E038 Other specified hypothyroidism: Secondary | ICD-10-CM

## 2020-11-25 DIAGNOSIS — J4541 Moderate persistent asthma with (acute) exacerbation: Secondary | ICD-10-CM

## 2020-11-25 DIAGNOSIS — I10 Essential (primary) hypertension: Secondary | ICD-10-CM | POA: Insufficient documentation

## 2020-11-25 DIAGNOSIS — Z803 Family history of malignant neoplasm of breast: Secondary | ICD-10-CM | POA: Diagnosis not present

## 2020-11-25 LAB — COMPREHENSIVE METABOLIC PANEL
ALT: 9 U/L (ref 0–44)
AST: 10 U/L — ABNORMAL LOW (ref 15–41)
Albumin: 3.7 g/dL (ref 3.5–5.0)
Alkaline Phosphatase: 64 U/L (ref 38–126)
Anion gap: 8 (ref 5–15)
BUN: 10 mg/dL (ref 6–20)
CO2: 25 mmol/L (ref 22–32)
Calcium: 8.9 mg/dL (ref 8.9–10.3)
Chloride: 107 mmol/L (ref 98–111)
Creatinine, Ser: 0.78 mg/dL (ref 0.44–1.00)
GFR, Estimated: >60 mL/min (ref >60)
Glucose, Bld: 121 mg/dL — ABNORMAL HIGH (ref 70–99)
Potassium: 3.5 mmol/L (ref 3.5–5.1)
Sodium: 140 mmol/L (ref 135–145)
Total Bilirubin: 0.5 mg/dL (ref 0.3–1.2)
Total Protein: 7.2 g/dL (ref 6.5–8.1)

## 2020-11-25 LAB — CBC WITH DIFFERENTIAL/PLATELET
Abs Immature Granulocytes: 0.02 10*3/uL (ref 0.00–0.07)
Basophils Absolute: 0.1 10*3/uL (ref 0.0–0.1)
Basophils Relative: 1 %
Eosinophils Absolute: 0.2 10*3/uL (ref 0.0–0.5)
Eosinophils Relative: 2 %
HCT: 36.6 % (ref 36.0–46.0)
Hemoglobin: 12.3 g/dL (ref 12.0–15.0)
Immature Granulocytes: 0 %
Lymphocytes Relative: 27 %
Lymphs Abs: 2.1 10*3/uL (ref 0.7–4.0)
MCH: 26.4 pg (ref 26.0–34.0)
MCHC: 33.6 g/dL (ref 30.0–36.0)
MCV: 78.5 fL — ABNORMAL LOW (ref 80.0–100.0)
Monocytes Absolute: 0.8 10*3/uL (ref 0.1–1.0)
Monocytes Relative: 10 %
Neutro Abs: 4.6 10*3/uL (ref 1.7–7.7)
Neutrophils Relative %: 60 %
Platelets: 263 10*3/uL (ref 150–400)
RBC: 4.66 MIL/uL (ref 3.87–5.11)
RDW: 13.2 % (ref 11.5–15.5)
WBC: 7.8 10*3/uL (ref 4.0–10.5)
nRBC: 0 % (ref 0.0–0.2)

## 2020-11-25 MED ORDER — FERROUS SULFATE 325 (65 FE) MG PO TBEC: 325.0000 mg | DELAYED_RELEASE_TABLET | Freq: Every day | ORAL | 4 refills | Status: DC

## 2020-11-25 MED ORDER — TAMOXIFEN CITRATE 20 MG PO TABS: 20.0000 mg | ORAL_TABLET | Freq: Every day | ORAL | 4 refills | Status: DC

## 2021-07-23 ENCOUNTER — Encounter (HOSPITAL_COMMUNITY): Payer: Self-pay

## 2021-08-07 ENCOUNTER — Encounter (INDEPENDENT_AMBULATORY_CARE_PROVIDER_SITE_OTHER): Payer: Self-pay

## 2021-08-10 ENCOUNTER — Other Ambulatory Visit: Payer: Self-pay

## 2021-08-18 ENCOUNTER — Other Ambulatory Visit: Payer: Self-pay | Admitting: Internal Medicine

## 2021-08-18 DIAGNOSIS — C50412 Malignant neoplasm of upper-outer quadrant of left female breast: Secondary | ICD-10-CM

## 2021-08-19 ENCOUNTER — Encounter: Payer: Self-pay | Admitting: Hematology and Oncology

## 2021-08-27 ENCOUNTER — Encounter (INDEPENDENT_AMBULATORY_CARE_PROVIDER_SITE_OTHER): Payer: Self-pay | Admitting: Ophthalmology

## 2021-08-27 ENCOUNTER — Ambulatory Visit (INDEPENDENT_AMBULATORY_CARE_PROVIDER_SITE_OTHER): Payer: BLUE CROSS/BLUE SHIELD | Admitting: Ophthalmology

## 2021-08-27 DIAGNOSIS — H35411 Lattice degeneration of retina, right eye: Secondary | ICD-10-CM

## 2021-08-27 DIAGNOSIS — H2513 Age-related nuclear cataract, bilateral: Secondary | ICD-10-CM | POA: Diagnosis not present

## 2021-08-27 DIAGNOSIS — Q141 Congenital malformation of retina: Secondary | ICD-10-CM | POA: Diagnosis not present

## 2021-08-27 DIAGNOSIS — H43813 Vitreous degeneration, bilateral: Secondary | ICD-10-CM

## 2021-08-27 NOTE — Assessment & Plan Note (Signed)
Mild cataract OU

## 2021-08-27 NOTE — Progress Notes (Signed)
08/27/2021     CHIEF COMPLAINT Patient presents for  Chief Complaint  Patient presents with   Retina Evaluation      HISTORY OF PRESENT ILLNESS: Veronica Reilly is a 56 y.o. female who presents to the clinic today for:   HPI     Retina Evaluation           Laterality: left eye   Associated Symptoms: Negative for Flashes, Floaters, Distortion, Blind Spot, Pain, Redness, Photophobia, Glare, Trauma, Scalp Tenderness, Jaw Claudication, Shoulder/Hip pain, Fever, Weight Loss and Fatigue         Comments   NP- Pigmented lesion OS, Ref by Stefan Church. Pt stated vision has been stable. Pt denies FOL but sees occasional floaters. Pt has allergy to betadine.       Last edited by Silvestre Moment on 08/27/2021  4:07 PM.      Referring physician: Leeroy Cha, MD [address] [place],  [place] [address]  HISTORICAL INFORMATION:   Selected notes from the MEDICAL RECORD NUMBER       CURRENT MEDICATIONS: Current Outpatient Medications (Ophthalmic Drugs)  Medication Sig   Olopatadine HCl (PATADAY) 0.7 % SOLN Apply 1 drop to eye daily as needed (itchy/watery eyes).   No current facility-administered medications for this visit. (Ophthalmic Drugs)   Current Outpatient Medications (Other)  Medication Sig   albuterol (VENTOLIN HFA) 108 (90 Base) MCG/ACT inhaler Use 2 puffs every four hours as needed for cough or wheeze.   budesonide-formoterol (SYMBICORT) 160-4.5 MCG/ACT inhaler Inhale 2 puffs into the lungs in the morning and at bedtime. with spacer and rinse mouth afterwards.   Cholecalciferol (VITAMIN D3) 50 MCG (2000 UT) TABS Take 2,000 Units by mouth every evening.   EPINEPHrine 0.3 mg/0.3 mL IJ SOAJ injection Use as directed for life threatening allergic reactions   esomeprazole (NEXIUM) 20 MG capsule Take 20 mg by mouth daily at 12 noon.   ferrous sulfate 325 (65 FE) MG EC tablet Take 1 tablet (325 mg total) by mouth daily with breakfast.   folic  acid (FOLVITE) 1 MG tablet Take 1 mg by mouth every evening.    hydrochlorothiazide (MICROZIDE) 12.5 MG capsule Take 12.5 mg by mouth daily.    levocetirizine (XYZAL) 5 MG tablet Take 1 tablet (5 mg total) by mouth every evening.   levothyroxine (SYNTHROID, LEVOTHROID) 112 MCG tablet Take 112 mcg by mouth daily before breakfast.    montelukast (SINGULAIR) 10 MG tablet Take 1 tablet (10 mg total) by mouth at bedtime.   NIFEdipine (PROCARDIA XL/ADALAT-CC) 60 MG 24 hr tablet Take 60 mg by mouth daily.   tamoxifen (NOLVADEX) 20 MG tablet Take 1 tablet (20 mg total) by mouth daily.   triamcinolone (NASACORT) 55 MCG/ACT AERO nasal inhaler Place 2 sprays into the nose daily.   Current Facility-Administered Medications (Other)  Medication Route   EPINEPHrine (EPI-PEN) injection 0.3 mg Intramuscular      REVIEW OF SYSTEMS: ROS   Negative for: Constitutional, Gastrointestinal, Neurological, Skin, Genitourinary, Musculoskeletal, HENT, Endocrine, Cardiovascular, Eyes, Respiratory, Psychiatric, Allergic/Imm, Heme/Lymph Last edited by Silvestre Moment on 08/27/2021  4:07 PM.       ALLERGIES Allergies  Allergen Reactions   Apple Juice Anaphylaxis   Iohexol     Contrast dye   Other Shortness Of Breath    CHERRIES, TREES, MOLD,DUST, COCKROACHES   Povidone-Iodine Anaphylaxis   Shellfish Allergy Anaphylaxis   Peach [Prunus Persica] Itching and Hives    mouth   Zocor [Simvastatin]  Other (See Comments)    Alopecia   Betadine [Povidone Iodine]     Due to shellfish allergy   Effexor [Venlafaxine] Swelling   Valsartan Hives    Other reaction(s): hives, itching   Wound Dressing Adhesive    Latex Rash    PAST MEDICAL HISTORY Past Medical History:  Diagnosis Date   ASCUS (atypical squamous cells of undetermined significance) on Pap smear    Asthma    controlled with daily inhaler   Breast cancer ([facility]) 06/29/2018   Left IDC   Dysplasia of cervix, low grade (CIN 1)    Eczema    Family history of  breast cancer    Family history of lung cancer    Family history of prostate cancer    GERD (gastroesophageal reflux disease)    Heart murmur    History of chicken pox    Hypertension    Hypothyroid    Personal history of chemotherapy    Personal history of radiation therapy    Radial scar of right breast    Uterine fibroid    Past Surgical History:  Procedure Laterality Date   ADENOIDECTOMY     BREAST BIOPSY Left 06/2018   BREAST BIOPSY Right 10/2019   x2   BREAST EXCISIONAL BIOPSY Right 03/2020   BREAST LUMPECTOMY Left 08/22/2018   BREAST LUMPECTOMY WITH RADIOACTIVE SEED AND SENTINEL LYMPH NODE BIOPSY Left 08/22/2018   Procedure: LEFT BREAST LUMPECTOMY WITH RADIOACTIVE SEED AND SENTINEL LYMPH NODE MAPPING;  Surgeon: Erroll Luna, MD;  Location: [facility];  Service: General;  Laterality: Left;   BREAST LUMPECTOMY WITH RADIOACTIVE SEED LOCALIZATION Right 04/16/2020   Procedure: RIGHT BREAST LUMPECTOMY WITH RADIOACTIVE SEED LOCALIZATION;  Surgeon: Erroll Luna, MD;  Location: [facility];  Service: General;  Laterality: Right;   LEEP     MYOMECTOMY     PORT-A-CATH REMOVAL N/A 03/13/2019   Procedure: PORT REMOVAL;  Surgeon: Erroll Luna, MD;  Location: [facility];  Service: General;  Laterality: N/A;   PORTACATH PLACEMENT Right 09/27/2018   Procedure: INSERTION PORT-A-CATH WITH ULTRASOUND;  Surgeon: Erroll Luna, MD;  Location: [facility];  Service: General;  Laterality: Right;   TONSILECTOMY, ADENOIDECTOMY, BILATERAL MYRINGOTOMY AND TUBES     TONSILLECTOMY      FAMILY HISTORY Family History  Problem Relation Age of Onset   Breast cancer Maternal Grandmother    Heart disease Father    Diabetes Father    Prostate cancer Father 70   Breast cancer Mother 38   Hypertension Other    Lung cancer Maternal Aunt    Allergic rhinitis Neg Hx    Angioedema Neg Hx    Asthma Neg Hx    Eczema Neg Hx    Immunodeficiency Neg Hx     Urticaria Neg Hx     SOCIAL HISTORY Social History   Tobacco Use   Smoking status: Never   Smokeless tobacco: Never  Substance Use Topics   Alcohol use: No   Drug use: No         OPHTHALMIC EXAM:  Base Eye Exam     Visual Acuity (ETDRS)       Right Left   Dist cc 20/15 -1 20/15 -1    Correction: Glasses         Tonometry (Tonopen, 4:12 PM)       Right Left   Pressure 15 13         Pupils  Pupils APD   Right PERRL None   Left PERRL None         Visual Fields       Left Right    Full Full         Extraocular Movement       Right Left    Full, Ortho Full, Ortho         Neuro/Psych     Oriented x3: Yes         Dilation     Both eyes: 1.0% Mydriacyl, 2.5% Phenylephrine @ 4:12 PM           Slit Lamp and Fundus Exam     External Exam       Right Left   External Normal Normal         Slit Lamp Exam       Right Left   Lids/Lashes Normal Normal   Conjunctiva/Sclera White and quiet White and quiet   Cornea Clear Clear   Anterior Chamber Deep and quiet Deep and quiet   Iris Round and reactive Round and reactive   Lens Clear Clear   Anterior Vitreous Normal Normal         Fundus Exam       Right Left   Posterior Vitreous Normal Normal   Disc Normal Normal   C/D Ratio 0.4 0.4   Macula Normal Normal   Vessels Normal Normal   Periphery Superotemporal lattice degeneration, no holes or tears highly pigmented. Large well-circumscribed deeply dark nearly black pigmented lesion inferotemporal to the inferotemporal arcade, deep to the retina, classic congenital hypertrophy of the RPE (CHRPE)            IMAGING AND PROCEDURES  Imaging and Procedures for 08/27/21  OCT, Retina - OU - Both Eyes       Right Eye Quality was good. Scan locations included subfoveal. Central Foveal Thickness: 288. Progression has no prior data. Findings include normal foveal contour, vitreomacular adhesion .   Left Eye Quality was  good. Central Foveal Thickness: 286. Progression has no prior data. Findings include normal foveal contour, vitreomacular adhesion .   Notes Partial PVD temporally OU.  Physiologic      Color Fundus Photography Optos - OU - Both Eyes       Right Eye Progression has no prior data. Disc findings include normal observations. Macula : normal observations. Vessels : normal observations.   Left Eye Progression has no prior data. Disc findings include normal observations. Macula : normal observations. Vessels : normal observations.   Notes Color and infrared pictures left eye confirmed CHRPE present inferotemporal quadrant.  OS.  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  Not related to previous breast cancer diagnoses  I explained to the patient that this condition will never cause a physiologic issue to her or to the eye.  Lattice degeneration of right retina No holes or tears today no high risk features  Nuclear sclerotic cataract of both eyes Mild cataract OU  ICD-10-CM   1. 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 Lattice degeneration of right retina  H35.411     3. Nuclear sclerotic cataract of both eyes  H25.13     4. Posterior vitreous detachment of both eyes  H43.813 OCT, Retina - OU - Both Eyes      1.  OS with congenital hypertrophy of the RPE for temporal quadrant left I have no physiologic  importance and no pathological significance.  2.  OD incidentally with lattice degeneration will continue to observe.  3.  Mild and NSC changes OU  4.  Partial PVD with partial VMA still in place, physiologic continue to observe  Ophthalmic Meds Ordered this visit:  No orders of the defined types were placed in this encounter.      Return if symptoms worsen or fail to improve.  There are no Patient Instructions on file for this visit.   Explained the diagnoses, plan, and follow up with the patient and they expressed understanding.  Patient expressed understanding of the importance of proper follow up care.   Clent Demark Brylie Sneath M.D. Diseases & Surgery of the Retina and Vitreous [facility] [place] 08/27/21     Abbreviations: M myopia (nearsighted); A astigmatism; H hyperopia (farsighted); P presbyopia; Mrx spectacle prescription;  CTL contact lenses; OD right eye; OS left eye; OU both eyes  XT exotropia; ET esotropia; PEK punctate epithelial keratitis; PEE punctate epithelial erosions; DES dry eye syndrome; MGD meibomian gland dysfunction; ATs artificial tears; PFAT's preservative free artificial tears; [place] nuclear sclerotic cataract; PSC posterior subcapsular cataract; ERM epi-retinal membrane; PVD posterior vitreous detachment; RD retinal detachment; DM diabetes mellitus; DR diabetic retinopathy; NPDR non-proliferative diabetic retinopathy; PDR proliferative diabetic retinopathy; CSME clinically significant macular edema; DME diabetic macular edema; dbh dot blot hemorrhages; CWS cotton wool spot; POAG primary open angle glaucoma; C/D cup-to-disc ratio; HVF humphrey visual field; GVF goldmann visual field; OCT optical coherence tomography; IOP intraocular pressure; BRVO Branch retinal vein occlusion; CRVO central retinal vein occlusion; CRAO central retinal artery occlusion; BRAO branch retinal artery occlusion; RT retinal tear; SB scleral buckle; PPV pars plana vitrectomy; VH  Vitreous hemorrhage; PRP panretinal laser photocoagulation; IVK intravitreal kenalog; VMT vitreomacular traction; MH Macular hole;  NVD neovascularization of the disc; NVE neovascularization elsewhere; AREDS age related eye disease study; ARMD age related macular degeneration; POAG primary open angle glaucoma; EBMD epithelial/anterior basement membrane dystrophy; ACIOL anterior chamber intraocular lens; IOL intraocular lens; PCIOL posterior chamber intraocular lens; Phaco/IOL phacoemulsification with intraocular lens placement; [place] photorefractive keratectomy; LASIK laser assisted in situ keratomileusis; HTN hypertension; DM diabetes mellitus; COPD chronic obstructive pulmonary disease

## 2021-08-27 NOTE — Assessment & Plan Note (Signed)
Large well-circumscribed lesion inferotemporal quadrant of no pathological significance.  Not related to previous breast cancer diagnoses  I explained to the patient that this condition will never cause a physiologic issue to her or to the eye.

## 2021-08-27 NOTE — Assessment & Plan Note (Signed)
No holes or tears today no high risk features

## 2021-09-02 ENCOUNTER — Encounter (INDEPENDENT_AMBULATORY_CARE_PROVIDER_SITE_OTHER): Payer: BLUE CROSS/BLUE SHIELD | Admitting: Ophthalmology

## 2021-09-03 IMAGING — MG MM BREAST LOCALIZATION CLIP
4 series · 4 of 12 positions shown · non-contrast
Comparison: Previous exam(s).

CLINICAL DATA: Procedure mammogram for clip placement.

EXAM:
DIAGNOSTIC RIGHT MAMMOGRAM POST STEREOTACTIC BIOPSY

[R ML synth-2D]
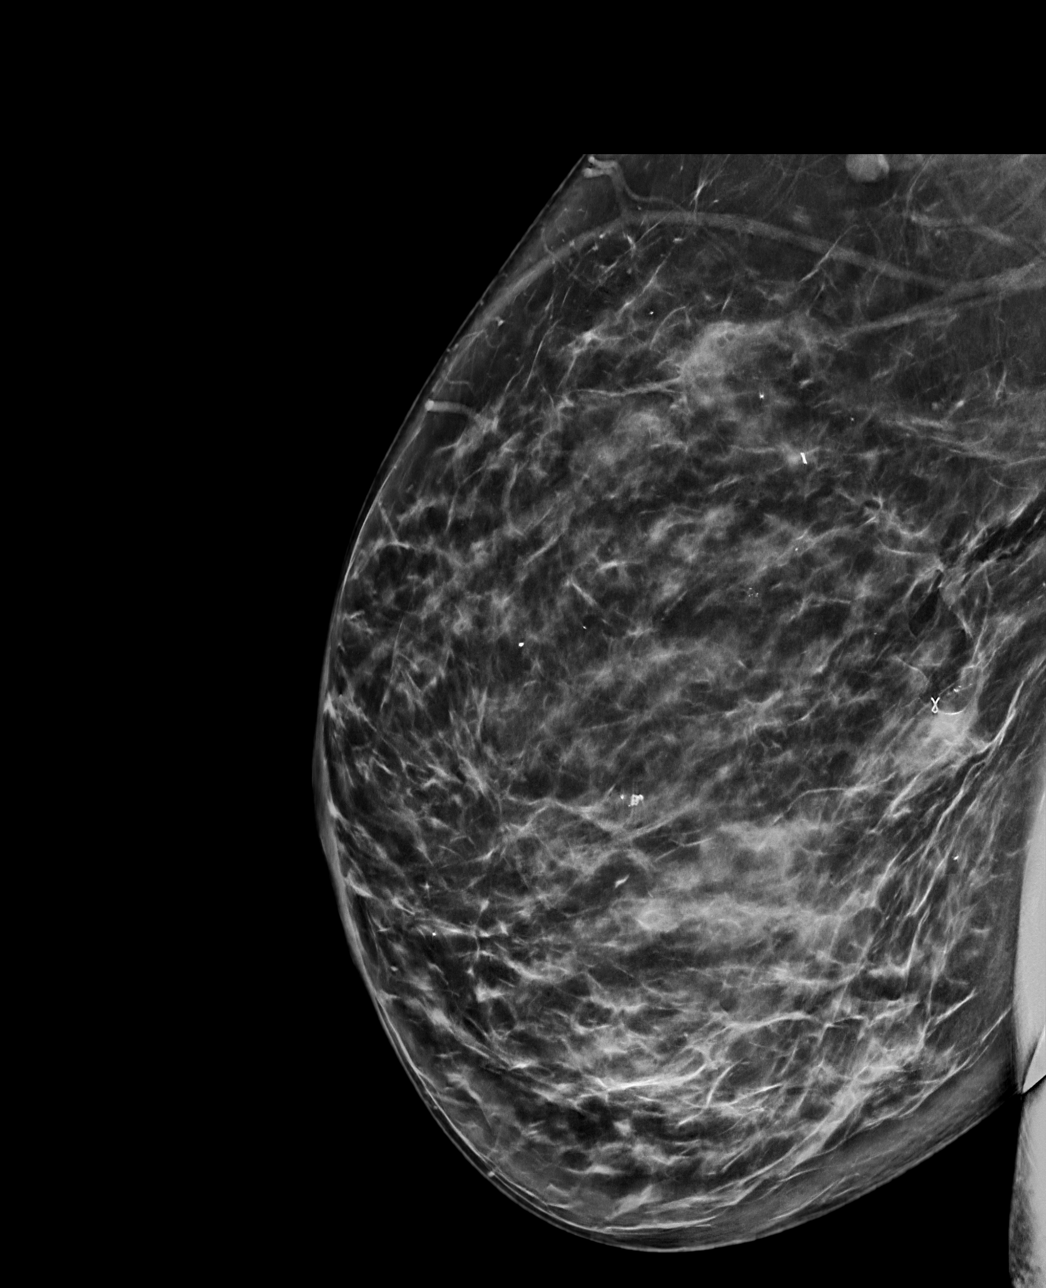

[R CC synth-2D]
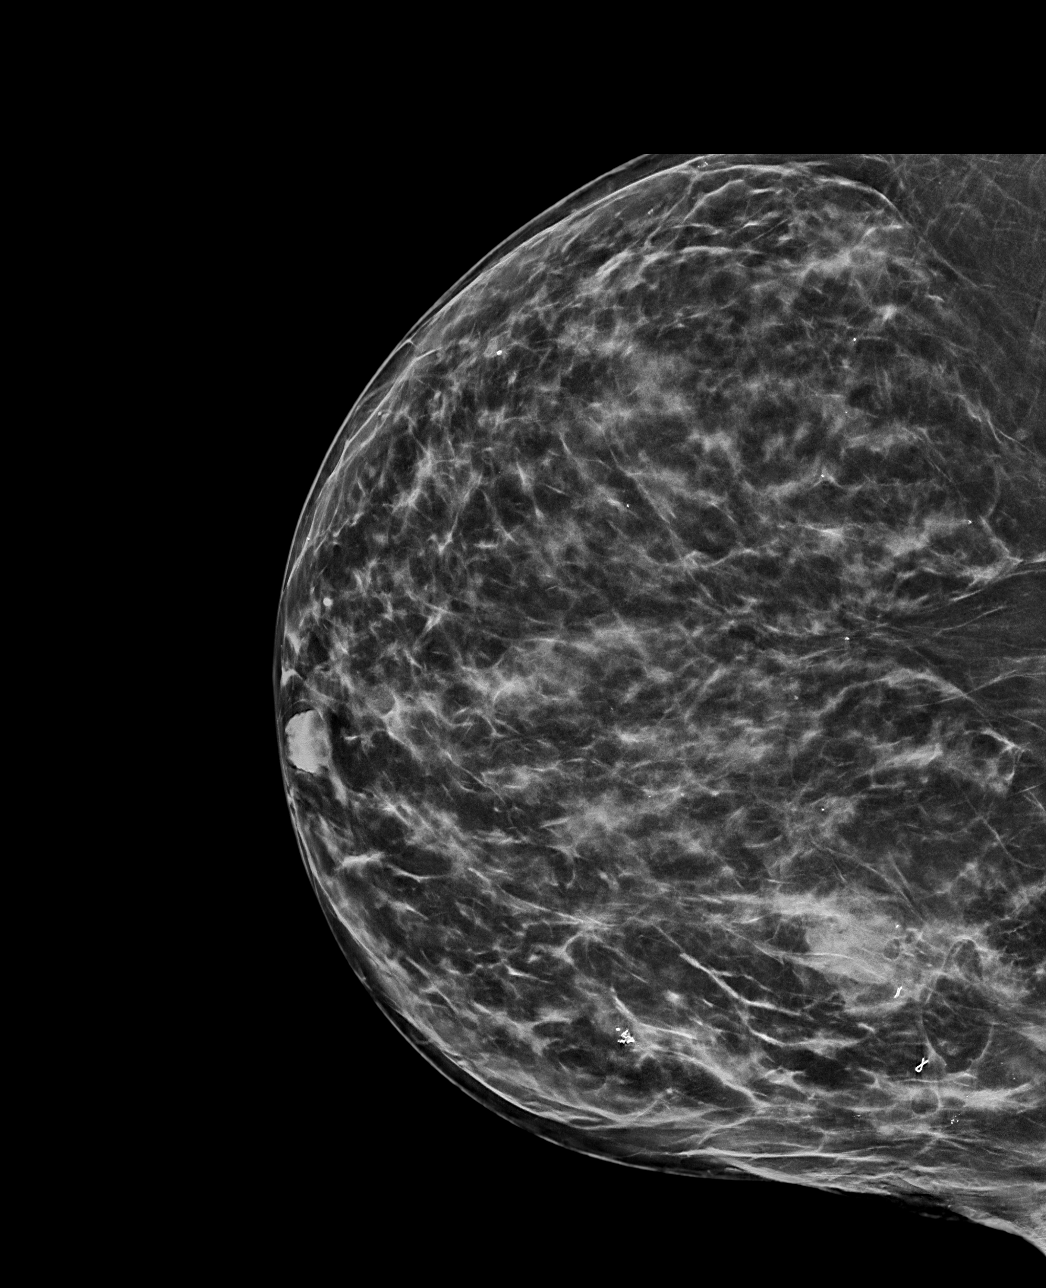

[R ML tomo · tomo slice 49/97.0]
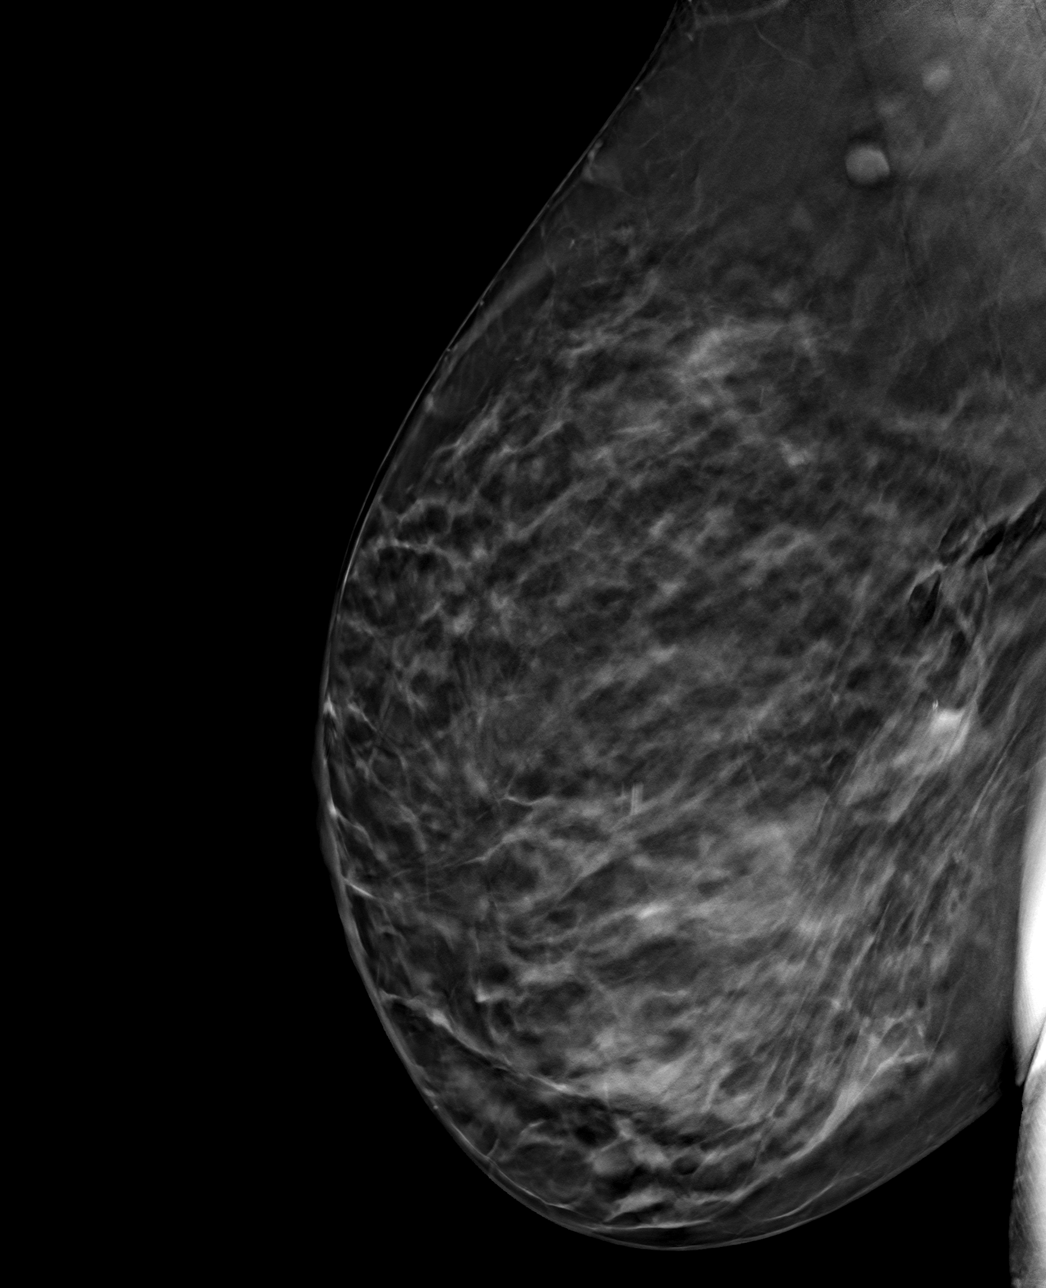

[R CC tomo · tomo slice 45/88.0]
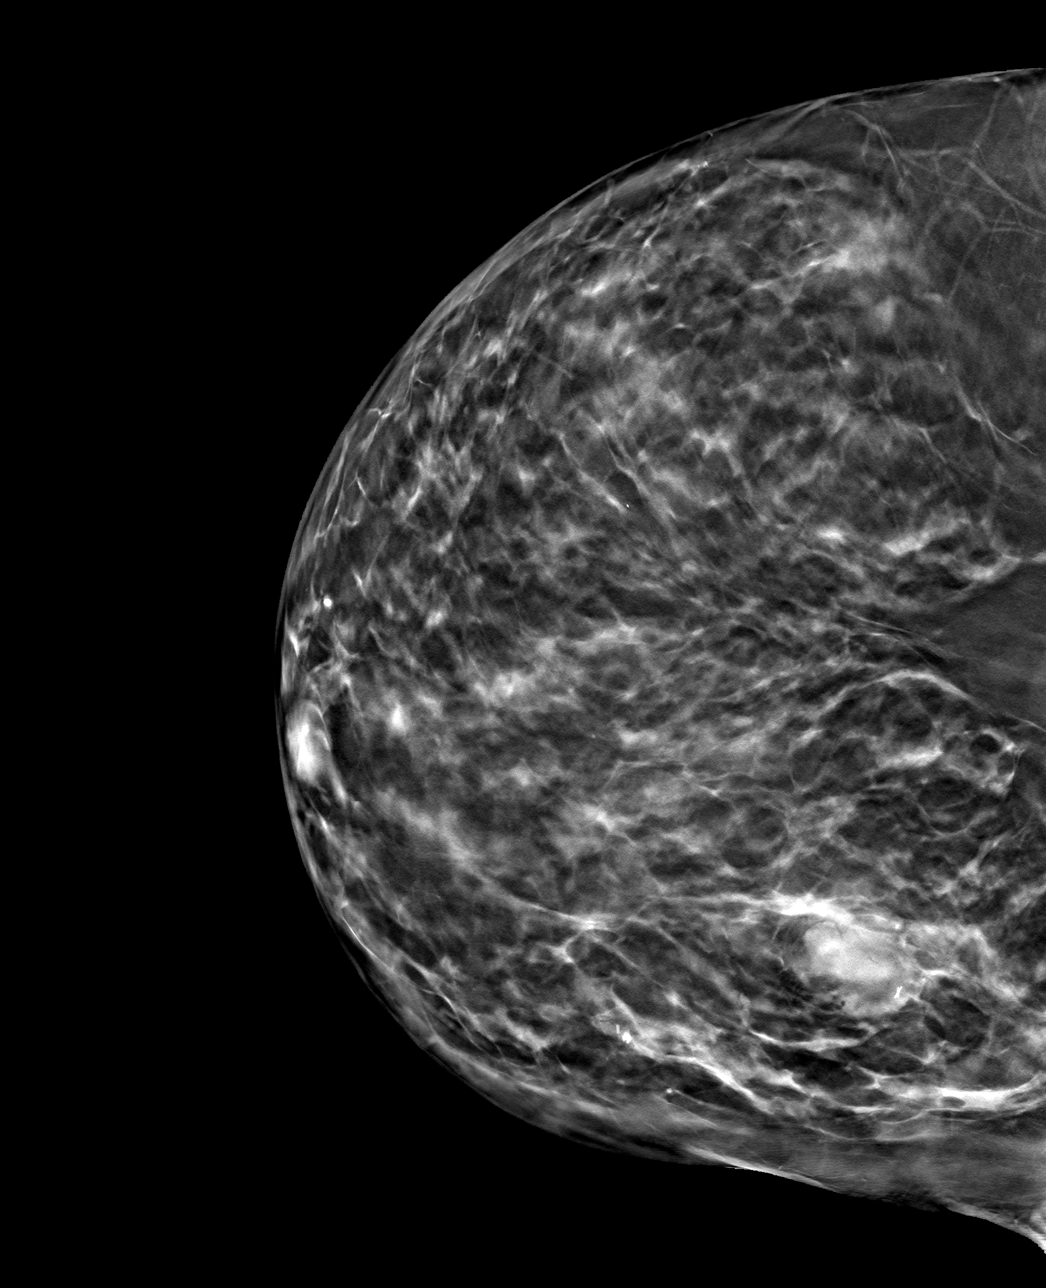

[4 of 12 positions shown; findings below may reference images not displayed]

FINDINGS: Mammographic images were obtained following stereotactic guided
biopsy of calcifications in the upper inner right breast. The X
biopsy marking clip is in expected position at the site of biopsy.

Mammographic images were obtained following stereotactic guided
biopsy of calcifications in the lower inner right breast. The ribbon
biopsy marking clip is in expected position at the site of biopsy.
IMPRESSION: Appropriate positioning of the X shaped biopsy marking clip at the
site of biopsy in the upper inner right breast.

Appropriate positioning of the ribbon shaped biopsy marking clip at
the site of biopsy in the lower inner right breast.

Small post biopsy hematomas at both sites.

Final Assessment: Post Procedure Mammograms for Marker Placement

## 2021-09-03 IMAGING — MG MM BREAST BX W/ LOC DEV EA AD LESION IMAG BX SPEC STEREO GUIDE*R
7 of 14 series · 7 of 26 positions shown · non-contrast
Comparison: Previous exams.
COMPARISON: Previous exams.

Addendum:
CLINICAL DATA: 54-year-old female with history of left breast
cancer status post lumpectomy in 1818.

EXAM:
RIGHT BREAST STEREOTACTIC CORE NEEDLE BIOPSY x 2

[R (1 of 7)]
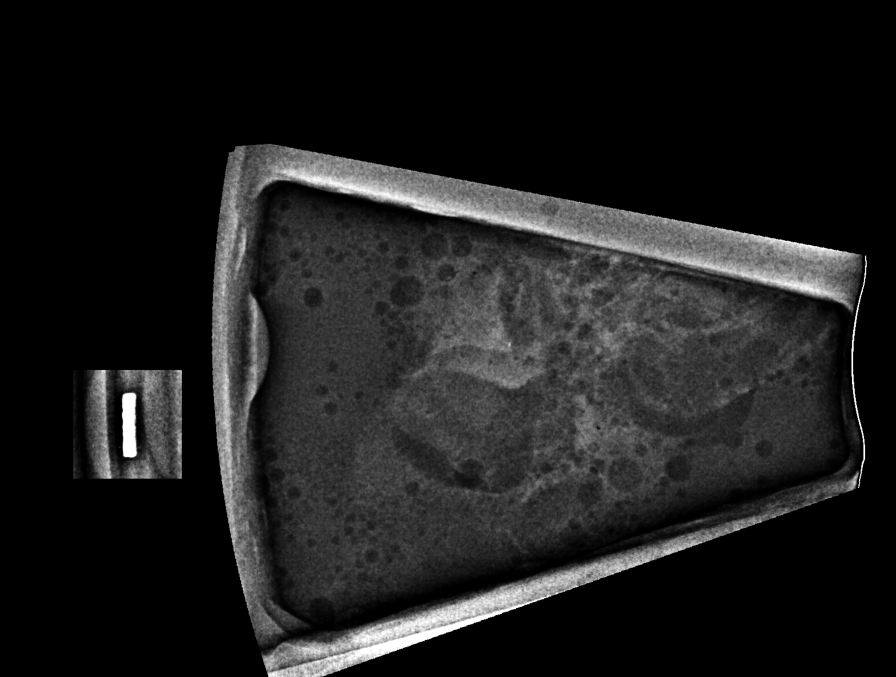

[R (2 of 7)]
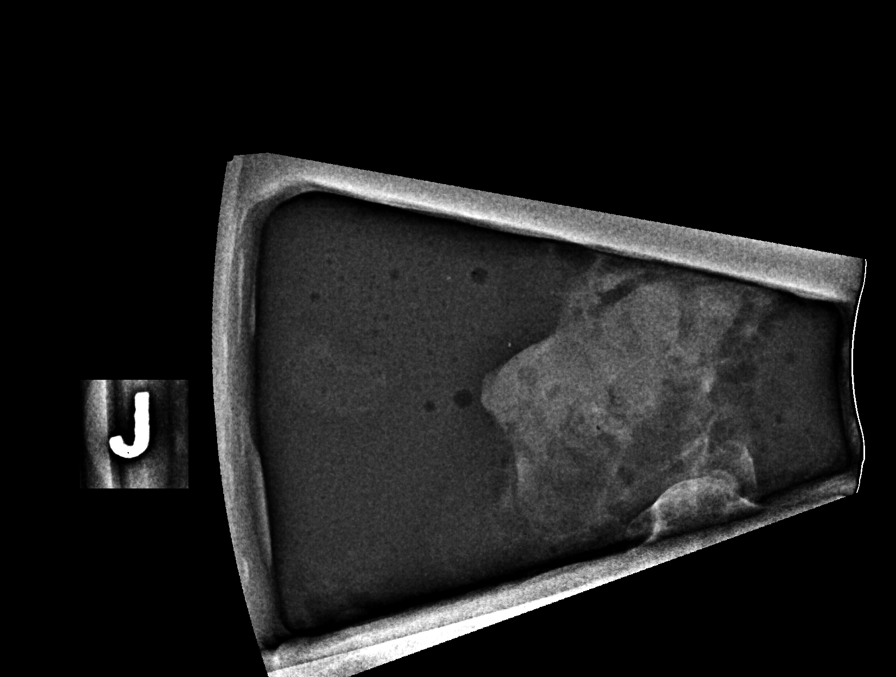

[R (3 of 7)]
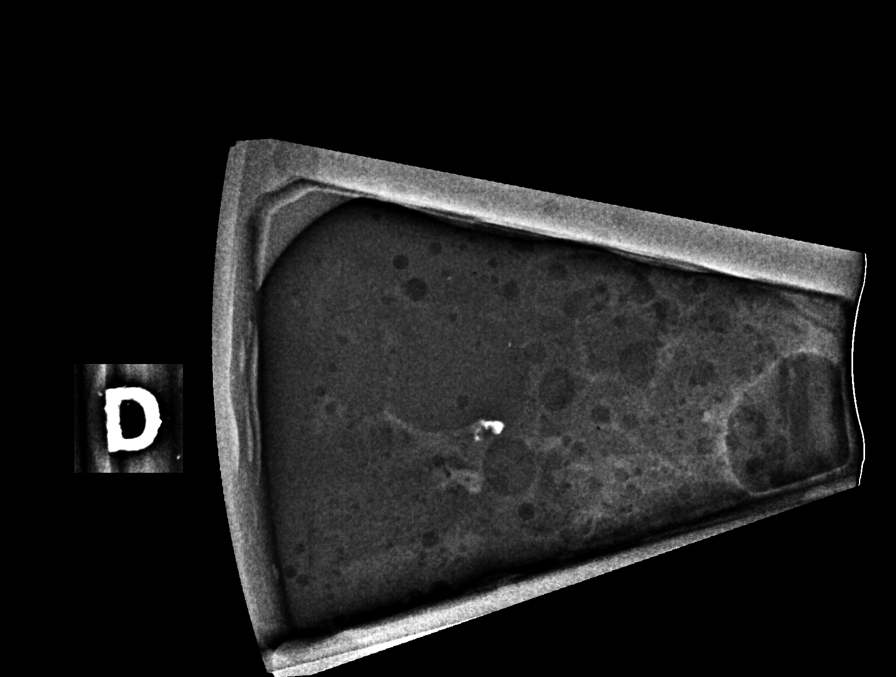

[R (4 of 7)]
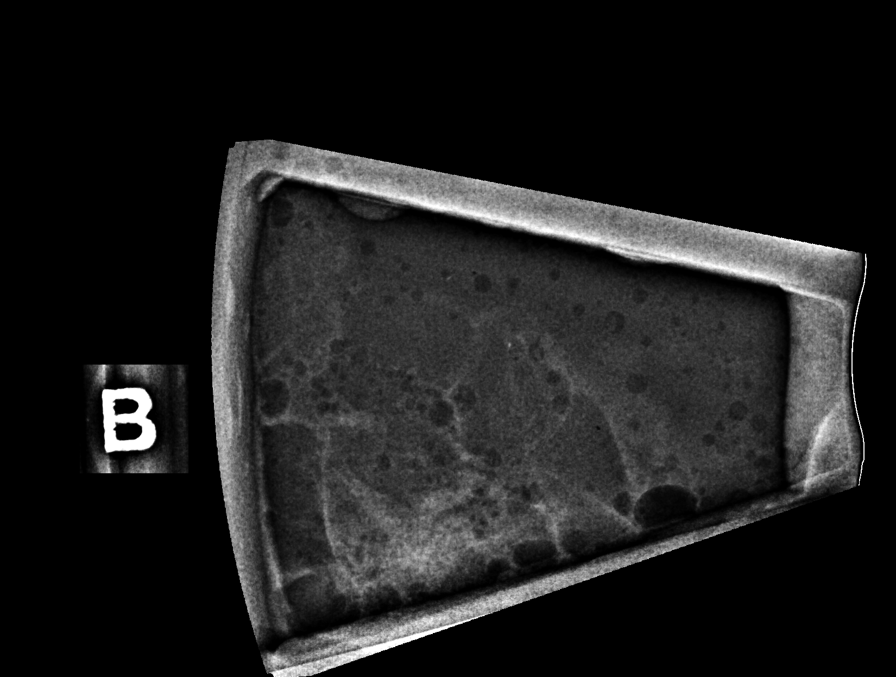

[R (5 of 7)]
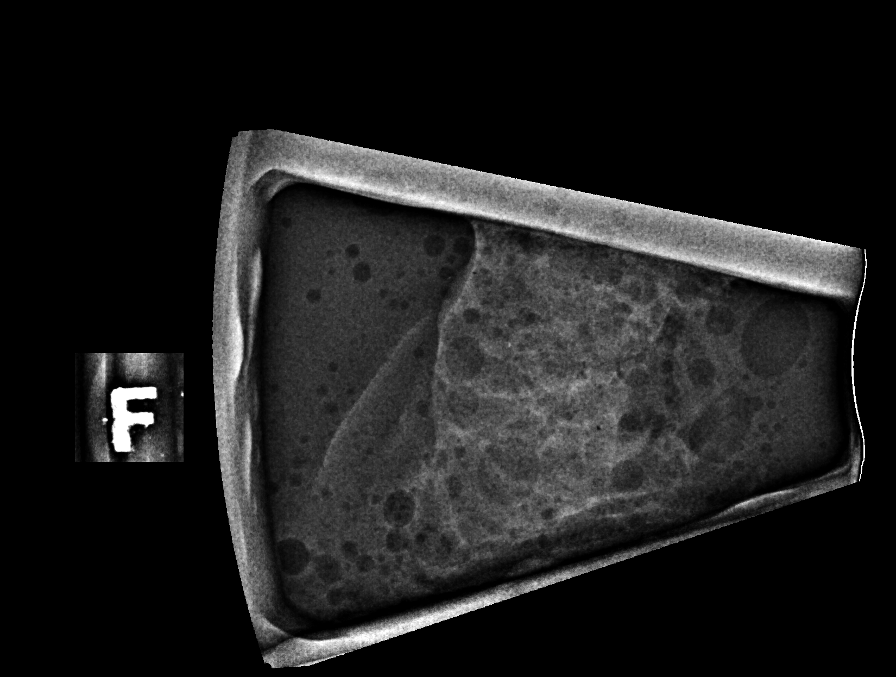

[R (6 of 7)]
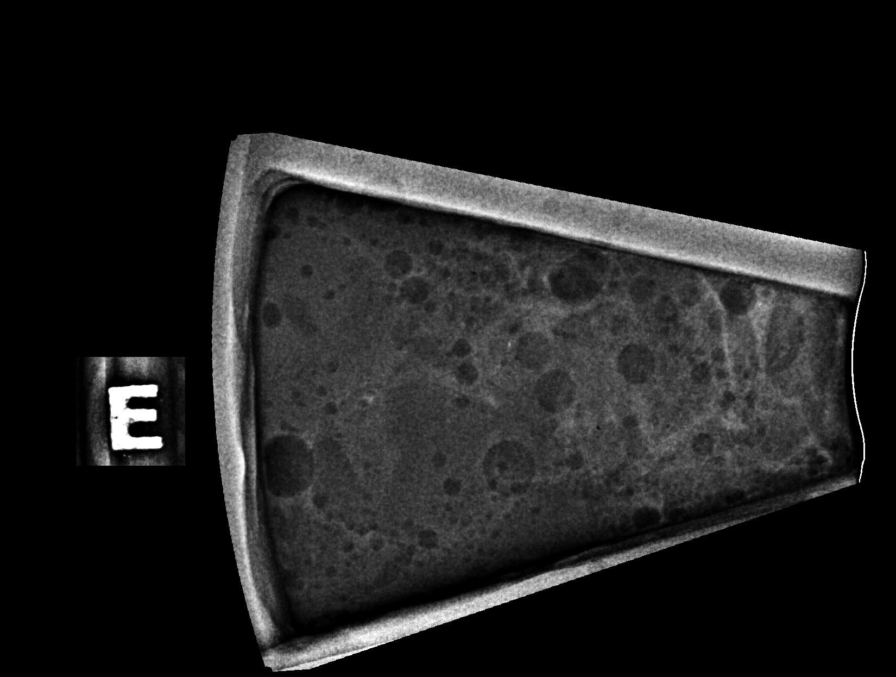

[R (7 of 7)]
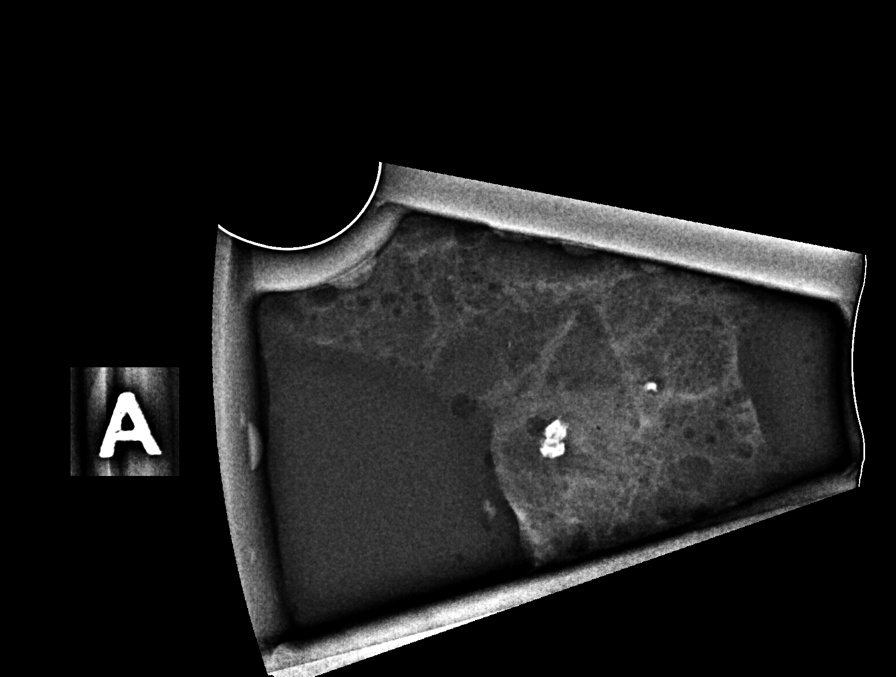

[7 of 26 positions shown; findings below may reference images not displayed]



1. Using sterile technique and 1% Lidocaine as local anesthetic,
under stereotactic guidance, a 9 gauge vacuum assisted device was
used to perform core needle biopsy of calcifications in the upper
inner right breast using a superior approach. Specimen radiograph
was performed showing at least 2 specimens with calcifications.
Specimens with calcifications are identified for pathology.

Lesion quadrant: Upper inner quadrant

At the conclusion of the procedure, an X tissue marker clip was
deployed into the biopsy cavity. Follow-up 2-view mammogram was
performed and dictated separately.

2. Using sterile technique and 1% Lidocaine as local anesthetic,
under stereotactic guidance, a 9 gauge vacuum assisted device was
used to perform core needle biopsy of calcifications in the lower
inner right breast using a medial approach. Specimen radiograph was
performed showing at least 3 specimens with calcifications.
Specimens with calcifications are identified for pathology.

Lesion quadrant: Lower inner quadrant

At the conclusion of the procedure, a ribbon tissue marker clip was
deployed into the biopsy cavity. Follow-up 2-view mammogram was
performed and dictated separately.
IMPRESSION: Stereotactic-guided biopsy of calcifications in the upper inner
right breast and of calcifications in the lower inner right breast.
Small post biopsy hematomas at both sites.

ADDENDUM:
Pathology revealed FIBROADENOMATOID CHANGE WITH CALCIFICATIONS of
the Right breast, upper inner. This was found to be concordant by
Dr. Alex Brayan Helguera.

Pathology revealed FIBROADENOMATOID CHANGE WITH CALCIFICATIONS,
COMPLEX SCLEROSING LESION WITH FLORID USUAL DUCTAL HYPERPLASIA of
the Right breast, lower inner. The differential includes a
hyalinized intraductal papilloma. This was found to be concordant by
Dr. Alex Brayan Helguera, with excision recommended.

Pathology results were discussed with the patient by telephone. The
patient reported doing well after the biopsies with tenderness at
the sites. Post biopsy instructions and care were reviewed and
questions were answered. The patient was encouraged to call The
direct phone number was provided.

Surgical consultation has been arranged with Dr. Doretha Teran, per
patient request, at [[email or web address]] on December 17, 2019.

Pathology results reported by Kanowah Horee, RN on 11/08/2019.



1. Using sterile technique and 1% Lidocaine as local anesthetic,
under stereotactic guidance, a 9 gauge vacuum assisted device was
used to perform core needle biopsy of calcifications in the upper
inner right breast using a superior approach. Specimen radiograph
was performed showing at least 2 specimens with calcifications.
Specimens with calcifications are identified for pathology.

Lesion quadrant: Upper inner quadrant

At the conclusion of the procedure, an X tissue marker clip was
deployed into the biopsy cavity. Follow-up 2-view mammogram was
performed and dictated separately.

2. Using sterile technique and 1% Lidocaine as local anesthetic,
under stereotactic guidance, a 9 gauge vacuum assisted device was
used to perform core needle biopsy of calcifications in the lower
inner right breast using a medial approach. Specimen radiograph was
performed showing at least 3 specimens with calcifications.
Specimens with calcifications are identified for pathology.

Lesion quadrant: Lower inner quadrant

At the conclusion of the procedure, a ribbon tissue marker clip was
deployed into the biopsy cavity. Follow-up 2-view mammogram was
performed and dictated separately.
IMPRESSION: Stereotactic-guided biopsy of calcifications in the upper inner
right breast and of calcifications in the lower inner right breast.
Small post biopsy hematomas at both sites.

## 2021-09-18 ENCOUNTER — Other Ambulatory Visit: Payer: Self-pay

## 2021-10-10 NOTE — Progress Notes (Unsigned)
FOLLOW UP Date of Service/Encounter:  10/12/21   Subjective:  Veronica Reilly (DOB: 1965-12-18) is a 56 y.o. female who returns to the [facility] on 10/12/2021 in re-evaluation of the following:  asthma, allergic rhinoconjunctivitis and adverse drug reaction History obtained from: chart review and patient.  For Review, LV was on 08/15/20  with Dr. Maudie Mercury seen for routine follow-up. Controlled on PRN Symbicort. ACT 22/25.  Allergies controlled on antihistamine, singulair and nasacort with extra-strength pataday.  Pertinent History/Diagnostics:  - Asthma: moderate persistent, well controlled with daily inhaler - Allergic Rhinitis: Past history - bloodwork in 2018 was positive to dust mites, cat, grass, cockroach, tree pollen. - shellfish allergy  Today presents for follow-up. She has been coughing for the past few weeks but has not consistently used her Symbicort.  She is doing really well now, but two weeks ago started with runny nose and mucus. After about a week, started coughing up the mucus. Last week she had a few days of feeling short winded. Coughing when she talks and in the middle of the night.  However this has improved over the past few days when she no longer feels that her asthma is acting up.  She did use her Symbicort a few times last week with the symptoms but not consistently. She started taking Allegra D yesterday.  Also taking mucinex and nasacort as of 2 days ago. Lots of rest and fluids. She did take Theraflu initially when symptoms started. Her husband also had similar symptoms and was seen at [facility], had negative Covid-19 and flu. Still has some congestion and drainage but overall is feeling much improved.  She needs refills of all of her medications.  Allergies as of 10/12/2021       Reactions   Apple Juice Anaphylaxis   Iohexol    Contrast dye   Other Shortness Of Breath   CHERRIES, TREES, MOLD,DUST, COCKROACHES   Povidone-iodine Anaphylaxis    Shellfish Allergy Anaphylaxis   Peach [prunus Persica] Itching, Hives   mouth   Zocor [simvastatin] Other (See Comments)   Alopecia   Betadine [povidone Iodine]    Due to shellfish allergy   Effexor [venlafaxine] Swelling   Valsartan Hives   Other reaction(s): hives, itching   Wound Dressing Adhesive    Latex Rash        Medication List        Accurate as of October 12, 2021  1:20 PM. If you have any questions, ask your nurse or doctor.          albuterol 108 (90 Base) MCG/ACT inhaler Commonly known as: Ventolin HFA Use 2 puffs every four hours as needed for cough or wheeze.   budesonide-formoterol 160-4.5 MCG/ACT inhaler Commonly known as: Symbicort Inhale 2 puffs into the lungs in the morning and at bedtime. with spacer and rinse mouth afterwards.   EPINEPHrine 0.3 mg/0.3 mL Soaj injection Commonly known as: EPI-PEN Use as directed for life threatening allergic reactions   esomeprazole 20 MG capsule Commonly known as: NEXIUM Take 20 mg by mouth daily at 12 noon.   ferrous sulfate 325 (65 FE) MG EC tablet Take 1 tablet (325 mg total) by mouth daily with breakfast.   folic acid 1 MG tablet Commonly known as: FOLVITE Take 1 mg by mouth every evening.   hydrochlorothiazide 12.5 MG capsule Commonly known as: MICROZIDE Take 12.5 mg by mouth daily.   ipratropium 0.03 % nasal spray Commonly known as: ATROVENT Place 2 sprays  into both nostrils 3 (three) times daily as needed for rhinitis.   levocetirizine 5 MG tablet Commonly known as: XYZAL Take 1 tablet (5 mg total) by mouth every evening.   levothyroxine 112 MCG tablet Commonly known as: SYNTHROID Take 112 mcg by mouth daily before breakfast.   montelukast 10 MG tablet Commonly known as: Singulair Take 1 tablet (10 mg total) by mouth at bedtime.   NIFEdipine 60 MG 24 hr tablet Commonly known as: PROCARDIA XL/NIFEDICAL XL Take 60 mg by mouth daily.   Pataday 0.7 % Soln Generic drug: Olopatadine  HCl Apply 1 drop to eye daily as needed (itchy/watery eyes).   tamoxifen 20 MG tablet Commonly known as: NOLVADEX Take 1 tablet (20 mg total) by mouth daily.   triamcinolone 55 MCG/ACT Aero nasal inhaler Commonly known as: NASACORT Place 2 sprays into the nose daily.   Vitamin D3 50 MCG (2000 UT) Tabs Take 2,000 Units by mouth every evening.       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   Dysplasia of cervix, low grade (CIN 1)    Eczema    Family history of breast cancer    Family history of lung cancer    Family history of prostate cancer    GERD (gastroesophageal reflux disease)    Heart murmur    History of chicken pox    Hypertension    Hypothyroid    Personal history of chemotherapy    Personal history of radiation therapy    Radial scar of right breast    Uterine fibroid    Past Surgical History:  Procedure Laterality Date   ADENOIDECTOMY     BREAST BIOPSY Left 06/2018   BREAST BIOPSY Right 10/2019   x2   BREAST EXCISIONAL BIOPSY Right 03/2020   BREAST LUMPECTOMY Left 08/22/2018   BREAST LUMPECTOMY WITH RADIOACTIVE SEED AND SENTINEL LYMPH NODE BIOPSY Left 08/22/2018   Procedure: LEFT BREAST LUMPECTOMY WITH RADIOACTIVE SEED AND SENTINEL LYMPH NODE MAPPING;  Surgeon: Erroll Luna, MD;  Location: [place];  Service: General;  Laterality: Left;   BREAST LUMPECTOMY WITH RADIOACTIVE SEED LOCALIZATION Right 04/16/2020   Procedure: RIGHT BREAST LUMPECTOMY WITH RADIOACTIVE SEED LOCALIZATION;  Surgeon: Erroll Luna, MD;  Location: [place];  Service: General;  Laterality: Right;   LEEP     MYOMECTOMY     PORT-A-CATH REMOVAL N/A 03/13/2019   Procedure: PORT REMOVAL;  Surgeon: Erroll Luna, MD;  Location: [facility];  Service: General;  Laterality: N/A;    PORTACATH PLACEMENT Right 09/27/2018   Procedure: INSERTION PORT-A-CATH WITH ULTRASOUND;  Surgeon: Erroll Luna, MD;  Location: [facility];  Service: General;  Laterality: Right;   TONSILECTOMY, ADENOIDECTOMY, BILATERAL MYRINGOTOMY AND TUBES     TONSILLECTOMY     Otherwise, there have been no changes to her past medical history, surgical history, family history, or social history.  ROS: All others negative except as noted per HPI.   Objective:  BP 108/70   Pulse 86   Temp 98.1 F (36.7 C) (Temporal)   Resp 17   Ht 5' 6.5" (1.689 m)   Wt 187 lb 8 oz (85 kg)   SpO2 100%   BMI 29.81 kg/m  Body mass index is 29.81 kg/m. Physical Exam: General Appearance:  Alert, cooperative, no  distress, appears stated age  Head:  Normocephalic, without obvious abnormality, atraumatic  Eyes:  Conjunctiva clear, EOM's intact  Nose: Nares normal, hypertrophic turbinates, normal mucosa, no visible anterior polyps, and septum midline  Throat: Lips, tongue normal; teeth and gums normal, normal posterior oropharynx and + cobblestoning  Neck: Supple, symmetrical  Lungs:   clear to auscultation bilaterally, Respirations unlabored, no coughing  Heart:  regular rate and rhythm and no murmur, Appears well perfused  Extremities: No edema  Skin: Skin color, texture, turgor normal, no rashes or lesions on visualized portions of skin  Neurologic: No gross deficits  Spirometry:  Tracings reviewed. Her effort: Good reproducible efforts. FVC: 2.39L FEV1: 1.91L, 78% predicted FEV1/FVC ratio: 100% Interpretation: Spirometry consistent with normal pattern.  Please see scanned spirometry results for details.  Assessment/Plan   Asthma-controlled Daily controller medication(s):  Continue Singulair (montelukast) '10mg'$  daily at night. During upper respiratory infections/asthma flares: start Symbicort 174mg 2 puffs twice a day with spacer and rinse mouth afterwards for 1-2 weeks until your breathing symptoms return  to baseline.  May use albuterol rescue inhaler OR Symbicort 2 puffs every 4 to 6 hours as needed for shortness of breath, chest tightness, coughing, and wheezing. May use albuterol rescue inhaler 2 puffs 5 to 15 minutes prior to strenuous physical activities. Monitor frequency of use.  Asthma control goals:  Full participation in all desired activities (may need albuterol before activity) Albuterol use two times or less a week on average (not counting use with activity) Cough interfering with sleep two times or less a month Oral steroids no more than once a year No hospitalizations   Allergic rhinoconjunctivitis-controlled Currently with increased rhinitis secondary to suspected viral upper respiratory infection.  Discussed only using decongestants for a short period of time (no more than 3 days).  Adding Atrovent for increased drainage Testing in 2018 was positive to dust mites, cat, grass, cockroach, tree pollen. Continue environmental control measures.  Use over the counter antihistamines such as Zyrtec (cetirizine), Claritin (loratadine), Allegra (fexofenadine), or Xyzal (levocetirizine) daily as needed. May take twice a day during allergy flares. May switch antihistamines every few months. Continue Singulair (montelukast) '10mg'$  daily at night. Use Nasacort (triamcinolone) nasal spray 2 sprays per nostril once a day as needed for nasal congestion.  Use olopatadine eye drops 0.7% once a day as needed for itchy/watery eyes. Start Atrovent nasal spray-1 to 2 sprays up to 3 times daily for runny nose and drainage Consider allergy injections for long term control if above medications do not help the symptoms.   Follow up with [facility] 6 months, sooner if needed.   ESigurd Sos MD  [facility]

## 2021-10-12 ENCOUNTER — Ambulatory Visit (INDEPENDENT_AMBULATORY_CARE_PROVIDER_SITE_OTHER): Payer: BLUE CROSS/BLUE SHIELD | Admitting: Internal Medicine

## 2021-10-12 ENCOUNTER — Encounter: Payer: Self-pay | Admitting: Internal Medicine

## 2021-10-12 VITALS — BP 108/70 | HR 86 | Temp 98.1°F | Resp 17 | Ht 66.5 in | Wt 187.5 lb

## 2021-10-12 DIAGNOSIS — J4540 Moderate persistent asthma, uncomplicated: Secondary | ICD-10-CM | POA: Diagnosis not present

## 2021-10-12 DIAGNOSIS — J302 Other seasonal allergic rhinitis: Secondary | ICD-10-CM

## 2021-10-12 DIAGNOSIS — H1013 Acute atopic conjunctivitis, bilateral: Secondary | ICD-10-CM

## 2021-10-12 DIAGNOSIS — H1010 Acute atopic conjunctivitis, unspecified eye: Secondary | ICD-10-CM

## 2021-10-12 MED ORDER — IPRATROPIUM BROMIDE 0.03 % NA SOLN: 2.0000 | Freq: Three times a day (TID) | NASAL | 12 refills | Status: AC | PRN

## 2021-10-12 MED ORDER — EPINEPHRINE 0.3 MG/0.3ML IJ SOAJ: INTRAMUSCULAR | 3 refills | Status: AC

## 2021-10-12 MED ORDER — ALBUTEROL SULFATE HFA 108 (90 BASE) MCG/ACT IN AERS
INHALATION_SPRAY | RESPIRATORY_TRACT | 1 refills | Status: DC
Start: 2021-10-12 — End: 2021-11-18

## 2021-10-12 MED ORDER — BUDESONIDE-FORMOTEROL FUMARATE 160-4.5 MCG/ACT IN AERO: 2.0000 | INHALATION_SPRAY | Freq: Two times a day (BID) | RESPIRATORY_TRACT | 5 refills | Status: AC

## 2021-10-12 MED ORDER — MONTELUKAST SODIUM 10 MG PO TABS: 10.0000 mg | ORAL_TABLET | Freq: Every day | ORAL | 5 refills | Status: AC

## 2021-10-12 NOTE — Patient Instructions (Addendum)
Asthma Daily controller medication(s):  Continue Singulair (montelukast) '10mg'$  daily at night. During upper respiratory infections/asthma flares: start Symbicort 156mg 2 puffs twice a day with spacer and rinse mouth afterwards for 1-2 weeks until your breathing symptoms return to baseline.  May use albuterol rescue inhaler OR Symbicort 2 puffs every 4 to 6 hours as needed for shortness of breath, chest tightness, coughing, and wheezing. May use albuterol rescue inhaler 2 puffs 5 to 15 minutes prior to strenuous physical activities. Monitor frequency of use.  Asthma control goals:  Full participation in all desired activities (may need albuterol before activity) Albuterol use two times or less a week on average (not counting use with activity) Cough interfering with sleep two times or less a month Oral steroids no more than once a year No hospitalizations   Allergic rhinoconjunctivitis Testing in 2018 was positive to dust mites, cat, grass, cockroach, tree pollen. Continue environmental control measures.  Use over the counter antihistamines such as Zyrtec (cetirizine), Claritin (loratadine), Allegra (fexofenadine), or Xyzal (levocetirizine) daily as needed. May take twice a day during allergy flares. May switch antihistamines every few months. Continue Singulair (montelukast) '10mg'$  daily at night. Use Nasacort (triamcinolone) nasal spray 2 sprays per nostril once a day as needed for nasal congestion\ Start Atrovent nasal spray-1 to 2 sprays up to 3 times daily for runny nose and drainage Use olopatadine eye drops 0.7%/Pataday extra strength once a day as needed for itchy/watery eyes. Consider allergy injections for long term control if above medications do not help the symptoms.   Follow up with u[facility] 6 months, sooner if needed.   It was wonderful meeting you today! Thank you for letting me participate in your care. ESigurd Sos MD [facility]   Control of House Dust  Mite Allergen Dust mite allergens are a common trigger of allergy and asthma symptoms. While they can be found throughout the house, these microscopic creatures thrive in warm, humid environments such as bedding, upholstered furniture and carpeting. Because so much time is spent in the bedroom, it is essential to reduce mite levels there.  Encase pillows, mattresses, and box springs in special allergen-proof fabric covers or airtight, zippered plastic covers.  Bedding should be washed weekly in hot water (130 F) and dried in a hot dryer. Allergen-proof covers are available for comforters and pillows that can't be regularly washed.  Wash the allergy-proof covers every few months. Minimize clutter in the bedroom. Keep pets out of the bedroom.  Keep humidity less than 50% by using a dehumidifier or air conditioning. You can buy a humidity measuring device called a hygrometer to monitor this.  If possible, replace carpets with hardwood, linoleum, or washable area rugs. If that's not possible, vacuum frequently with a vacuum that has a HEPA filter. Remove all upholstered furniture and non-washable window drapes from the bedroom. Remove all non-washable stuffed toys from the bedroom.  Wash stuffed toys weekly. Pet Allergen Avoidance: Contrary to popular opinion, there are no "hypoallergenic" breeds of dogs or cats. That is because people are not allergic to an animal's hair, but to an allergen found in the animal's saliva, dander (dead skin flakes) or urine. Pet allergy symptoms typically occur within minutes. For some people, symptoms can build up and become most severe 8 to 12 hours after contact with the animal. People with severe allergies can experience reactions in public places if dander has been transported on the pet owners' clothing. Keeping an animal outdoors is only  a partial solution, since homes with pets in the yard still have higher concentrations of animal allergens. Before getting a pet,  ask your allergist to determine if you are allergic to animals. If your pet is already considered part of your family, try to minimize contact and keep the pet out of the bedroom and other rooms where you spend a great deal of time. As with dust mites, vacuum carpets often or replace carpet with a hardwood floor, tile or linoleum. High-efficiency particulate air (HEPA) cleaners can reduce allergen levels over time. While dander and saliva are the source of cat and dog allergens, urine is the source of allergens from rabbits, hamsters, mice and [place] pigs; so ask a non-allergic family member to clean the animal's cage. If you have a pet allergy, talk to your allergist about the potential for allergy immunotherapy (allergy shots). This strategy can often provide long-term relief. Cockroach Allergen Avoidance Cockroaches are often found in the homes of densely populated urban areas, schools or commercial buildings, but these creatures can lurk almost anywhere. This does not mean that you have a dirty house or living area. Block all areas where roaches can enter the home. This includes crevices, wall cracks and windows.  Cockroaches need water to survive, so fix and seal all leaky faucets and pipes. Have an exterminator go through the house when your family and pets are gone to eliminate any remaining roaches. Keep food in lidded containers and put pet food dishes away after your pets are done eating. Vacuum and sweep the floor after meals, and take out garbage and recyclables. Use lidded garbage containers in the kitchen. Wash dishes immediately after use and clean under stoves, refrigerators or toasters where crumbs can accumulate. Wipe off the stove and other kitchen surfaces and cupboards regularly. Reducing Pollen Exposure Pollen seasons: trees (spring), grass (summer) and ragweed/weeds (fall). Keep windows closed in your home and car to lower pollen exposure.  Install air conditioning in the bedroom  and throughout the house if possible.  Avoid going out in dry windy days - especially early morning. Pollen counts are highest between 5 - 10 AM and on dry, hot and windy days.  Save outside activities for late afternoon or after a heavy rain, when pollen levels are lower.  Avoid mowing of grass if you have grass pollen allergy. Be aware that pollen can also be transported indoors on people and pets.  Dry your clothes in an automatic dryer rather than hanging them outside where they might collect pollen.  Rinse hair and eyes before bedtime.

## 2021-10-26 ENCOUNTER — Encounter: Payer: Self-pay | Admitting: Oncology

## 2021-11-02 ENCOUNTER — Ambulatory Visit: Payer: BLUE CROSS/BLUE SHIELD

## 2021-11-16 ENCOUNTER — Telehealth: Payer: Self-pay

## 2021-11-16 ENCOUNTER — Encounter: Payer: Self-pay | Admitting: Oncology

## 2021-11-16 ENCOUNTER — Telehealth: Payer: Self-pay | Admitting: Hematology and Oncology

## 2021-11-16 NOTE — Telephone Encounter (Signed)
Patient called in stating that she is having the same symptoms as before : congesting, cough, mucous with a yellowish tint. Patient requesting advise on what she should do about this. Does she need to add on medication or do something different with her current medications?

## 2021-11-16 NOTE — Telephone Encounter (Signed)
Rescheduled appointment per provider [facility]. Patient is aware of the changes made to her upcoming appointment. 

## 2021-11-17 ENCOUNTER — Other Ambulatory Visit: Payer: Self-pay

## 2021-11-18 ENCOUNTER — Telehealth: Payer: Self-pay | Admitting: Internal Medicine

## 2021-11-18 MED ORDER — ALBUTEROL SULFATE HFA 108 (90 BASE) MCG/ACT IN AERS: INHALATION_SPRAY | RESPIRATORY_TRACT | 1 refills | Status: AC

## 2021-11-18 NOTE — Telephone Encounter (Signed)
Mychart message sent with instructions. Will wait for patient to respond for further actions.

## 2021-11-18 NOTE — Telephone Encounter (Signed)
Called and left VM message asking patient to give our office a call back to ask more questions about her cough and meds she was taking. She did not answer. Please advise

## 2021-11-18 NOTE — Telephone Encounter (Signed)
Spoke to patient she wasn't taking her medications consistently. She will start by taking all medications consistently. Montelukast 10 mg she does every night and and either zyrtec or allegra every morning. She will start taking her symbicort 160 mcg 2 puffs twice daily until cough and wheeze free 1-2 weeks. Patient will order a spacer.  Ventolin hfa 2 every 4-6 puffs as needed. Atrovent 0.03% one spray per nostril 3 times daily, and her allergra D and patient knows not to do more than 3 days in a row. Did hear patient cough several times while on the phone. Will send in another rescue inhaler. Patient lives and works in [place] [place] during the week and [place] on the weekends. Told patient if she feels her cough is not getting better by Friday to call office and we will do a televisit. Patient was fine with that.

## 2021-11-18 NOTE — Telephone Encounter (Signed)
Refer to previous note. Thank you

## 2021-11-18 NOTE — Telephone Encounter (Signed)
Patient calling in stating she has a persistent cough with mucus over the last 3 days no Fever mucus has no yellow or greenish color asking if something can be called in for this cough please advise

## 2021-11-18 NOTE — Addendum Note (Signed)
Addended by: Gerre Pebbles A on: 11/18/2021 06:15 PM   Modules accepted: Orders

## 2021-11-19 NOTE — Telephone Encounter (Signed)
Trayce has also sent a message to patient regarding more information on cough and to call back if already doing what we discussed at her last visit in September. Awaiting additional info from patient.

## 2021-11-23 ENCOUNTER — Other Ambulatory Visit: Payer: Self-pay

## 2021-11-23 DIAGNOSIS — C50412 Malignant neoplasm of upper-outer quadrant of left female breast: Secondary | ICD-10-CM

## 2021-11-24 ENCOUNTER — Ambulatory Visit
Admission: RE | Admit: 2021-11-24 | Discharge: 2021-11-24 | Disposition: A | Discharge status: Home / Self Care | Payer: BC Managed Care – PPO | Source: Ambulatory Visit | Attending: Internal Medicine | Admitting: Internal Medicine

## 2021-11-24 ENCOUNTER — Inpatient Hospital Stay: Payer: BC Managed Care – PPO | Attending: Hematology and Oncology

## 2021-11-24 DIAGNOSIS — Z170 Estrogen receptor positive status [ER+]: Secondary | ICD-10-CM

## 2021-11-24 DIAGNOSIS — C50412 Malignant neoplasm of upper-outer quadrant of left female breast: Secondary | ICD-10-CM | POA: Insufficient documentation

## 2021-11-24 DIAGNOSIS — D573 Sickle-cell trait: Secondary | ICD-10-CM | POA: Diagnosis not present

## 2021-11-24 DIAGNOSIS — F419 Anxiety disorder, unspecified: Secondary | ICD-10-CM | POA: Diagnosis not present

## 2021-11-24 LAB — CBC WITH DIFFERENTIAL (CANCER CENTER ONLY)
Abs Immature Granulocytes: 0.02 10*3/uL (ref 0.00–0.07)
Basophils Absolute: 0.1 10*3/uL (ref 0.0–0.1)
Basophils Relative: 1 %
Eosinophils Absolute: 0.4 10*3/uL (ref 0.0–0.5)
Eosinophils Relative: 5 %
HCT: 38.4 % (ref 36.0–46.0)
Hemoglobin: 12.8 g/dL (ref 12.0–15.0)
Immature Granulocytes: 0 %
Lymphocytes Relative: 30 %
Lymphs Abs: 2.4 10*3/uL (ref 0.7–4.0)
MCH: 27.6 pg (ref 26.0–34.0)
MCHC: 33.3 g/dL (ref 30.0–36.0)
MCV: 82.8 fL (ref 80.0–100.0)
Monocytes Absolute: 0.9 10*3/uL (ref 0.1–1.0)
Monocytes Relative: 11 %
Neutro Abs: 4.4 10*3/uL (ref 1.7–7.7)
Neutrophils Relative %: 53 %
Platelet Count: 240 10*3/uL (ref 150–400)
RBC: 4.64 MIL/uL (ref 3.87–5.11)
RDW: 13 % (ref 11.5–15.5)
WBC Count: 8.1 10*3/uL (ref 4.0–10.5)
nRBC: 0 % (ref 0.0–0.2)

## 2021-11-24 LAB — CMP (CANCER CENTER ONLY)
ALT: 179 U/L — ABNORMAL HIGH (ref 0–44)
AST: 50 U/L — ABNORMAL HIGH (ref 15–41)
Albumin: 4 g/dL (ref 3.5–5.0)
Alkaline Phosphatase: 71 U/L (ref 38–126)
Anion gap: 7 (ref 5–15)
BUN: 11 mg/dL (ref 6–20)
CO2: 29 mmol/L (ref 22–32)
Calcium: 9.2 mg/dL (ref 8.9–10.3)
Chloride: 106 mmol/L (ref 98–111)
Creatinine: 0.91 mg/dL (ref 0.44–1.00)
GFR, Estimated: >60 mL/min (ref >60)
Glucose, Bld: 70 mg/dL (ref 70–99)
Potassium: 3.6 mmol/L (ref 3.5–5.1)
Sodium: 142 mmol/L (ref 135–145)
Total Bilirubin: 0.7 mg/dL (ref 0.3–1.2)
Total Protein: 7.3 g/dL (ref 6.5–8.1)

## 2021-11-25 ENCOUNTER — Ambulatory Visit: Payer: BLUE CROSS/BLUE SHIELD | Admitting: Hematology and Oncology

## 2021-11-25 ENCOUNTER — Other Ambulatory Visit: Payer: BLUE CROSS/BLUE SHIELD

## 2021-11-25 ENCOUNTER — Telehealth: Payer: Self-pay

## 2021-11-25 NOTE — Telephone Encounter (Signed)
Pt called for concern regarding lab results. She meets w/ MD 11/10. She is concerned about her ALT and AS. Advised pt this is associated with her liver, and without further lab results it would be difficult to discuss. She will further discuss this with MD during appt 11/10

## 2021-11-26 NOTE — Progress Notes (Signed)
HEMATOLOGY-ONCOLOGY TELEPHONE VISIT PROGRESS NOTE  I connected with our patient on 11/27/21 at 10:45 AM EST by telephone and verified that I am speaking with the correct person using two identifiers.  I discussed the limitations, risks, security and privacy concerns of performing an evaluation and management service by telephone and the availability of in person appointments.  I also discussed with the patient that there may be a patient responsible charge related to this service. The patient expressed understanding and agreed to proceed.   History of Present Illness: Veronica Reilly is a 56 y.o female with left breast cancer surveillance. She presents to the clinic via phone follow-up. lost 57 lb on Wegovy. BP and A1C are in better control.  Oncology History  Malignant neoplasm of upper-outer quadrant of left breast in female, estrogen receptor positive ([place])  07/05/2018 Initial Diagnosis   Malignant neoplasm of upper-outer quadrant of left breast in female, estrogen receptor positive ([place])   08/22/2018 Cancer Staging   Staging form: Breast, AJCC 8th Edition - Pathologic stage from 08/22/2018: Stage IIA (pT2, pN0, cM0, G2, ER+, PR-, HER2-) - Signed by Gardenia Phlegm, NP on 09/06/2018   09/29/2018 -  Chemotherapy   The patient had dexamethasone (DECADRON) 4 MG tablet, 8 mg, Oral, 2 times daily, 1 of 1 cycle, Start date: 09/11/2018, End date: 01/25/2019 palonosetron (ALOXI) injection 0.25 mg, 0.25 mg, Intravenous,  Once, 4 of 4 cycles Administration: 0.25 mg (09/29/2018), 0.25 mg (10/19/2018), 0.25 mg (11/09/2018), 0.25 mg (11/30/2018) pegfilgrastim-jmdb (FULPHILA) injection 6 mg, 6 mg, Subcutaneous,  Once, 4 of 4 cycles Administration: 6 mg (09/30/2018), 6 mg (10/21/2018), 6 mg (11/11/2018) cyclophosphamide (CYTOXAN) 1,260 mg in sodium chloride 0.9 % 250 mL chemo infusion, 600 mg/m2 = 1,260 mg, Intravenous,  Once, 4 of 4 cycles Administration: 1,260 mg (09/29/2018), 1,260 mg (10/19/2018), 1,260  mg (11/09/2018), 1,260 mg (11/30/2018) DOCEtaxel (TAXOTERE) 160 mg in sodium chloride 0.9 % 250 mL chemo infusion, 75 mg/m2 = 160 mg, Intravenous,  Once, 4 of 4 cycles Administration: 160 mg (09/29/2018), 160 mg (10/19/2018), 160 mg (11/09/2018), 160 mg (11/30/2018)  for chemotherapy treatment.      REVIEW OF SYSTEMS:   Constitutional: Denies fevers, chills or abnormal weight loss All other systems were reviewed with the patient and are negative. Observations/Objective:     Assessment Plan:  Malignant neoplasm of upper-outer quadrant of left breast in female, estrogen receptor positive ([place]) 10/29/2019: Sickle cell trait (56.6% hemoglobin A, 2.8% hemoglobin A 2, 40.6% hemoglobin S, 0% hemoglobin F).  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  She will contact and get that checked.  She currently works in [place] but has a home in [place]. Patient stays extremely busy with work travel and her husband's condition (Renal failure: travel nurse: Peritoneal dialysis at  home) Return to clinic in 1 year for follow-up    I discussed the assessment and treatment plan  with the patient. The patient was provided an opportunity to ask questions and all were answered. The patient agreed with the plan and demonstrated an understanding of the instructions. The patient was advised to call back or seek an in-person evaluation if the symptoms worsen or if the condition fails to improve as anticipated.   I provided 15 minutes of non-face-to-face time during this encounter.  This includes time for charting and coordination of care   Harriette Ohara, MD  I Gardiner Coins am scribing for Dr. Lindi Adie  I have reviewed the above documentation for accuracy and completeness, and I agree with the above.

## 2021-11-26 NOTE — Telephone Encounter (Signed)
Patient stated that she doesn't feel bad or short winded just has a cough. She stated that she can wait until Dr. Simona Huh is back in office next week to discuss any further instructions. She does not feel that she needs an appointment at this time.

## 2021-11-27 ENCOUNTER — Inpatient Hospital Stay (HOSPITAL_BASED_OUTPATIENT_CLINIC_OR_DEPARTMENT_OTHER): Payer: BC Managed Care – PPO | Admitting: Hematology and Oncology

## 2021-11-27 DIAGNOSIS — C50412 Malignant neoplasm of upper-outer quadrant of left female breast: Secondary | ICD-10-CM | POA: Diagnosis not present

## 2021-11-27 DIAGNOSIS — Z170 Estrogen receptor positive status [ER+]: Secondary | ICD-10-CM | POA: Diagnosis not present

## 2021-11-27 MED ORDER — TAMOXIFEN CITRATE 20 MG PO TABS: 20.0000 mg | ORAL_TABLET | Freq: Every day | ORAL | 4 refills | Status: DC

## 2021-11-27 NOTE — Assessment & Plan Note (Signed)
10/29/2019: Sickle cell trait (56.6% hemoglobin A, 2.8% hemoglobin A 2, 40.6% hemoglobin S, 0% hemoglobin F).  06/29/2018: T2N0 grade 2 IDC ER/PR positive HER2 negative Ki-67 10% 07/18/2018: Genetics: Negative 08/22/2018: Left lumpectomy T2N0 stage Ib grade 2 IDC negative margins 0/10 lymph nodes, Oncotype 34: R OR 22% 09/29/2018-11/30/2018: Taxotere and Cytoxan every 3 weeks x4 12/26/2018-01/24/2019: Adjuvant radiation 04/16/2020: Right lumpectomy: Complex sclerosing lesion  Current treatment: Tamoxifen started 01/24/2018 Tamoxifen toxicities:  Breast cancer surveillance: Breast exam 11/27/2021: Benign Mammogram 11/24/2021: Benign breast density category C (patient wants diagnostic mammograms annually)  Patient stays extremely busy with work travel and her husband's condition (he worked as a PA at [employer]) Return to clinic in 1 year for follow-up

## 2021-11-30 ENCOUNTER — Telehealth: Payer: Self-pay | Admitting: Hematology and Oncology

## 2021-11-30 NOTE — Telephone Encounter (Signed)
Scheduled appointment per 11/10 los. Patient is aware. 

## 2021-12-01 ENCOUNTER — Other Ambulatory Visit: Payer: Self-pay

## 2021-12-03 ENCOUNTER — Other Ambulatory Visit: Payer: Self-pay

## 2022-05-30 ENCOUNTER — Other Ambulatory Visit: Payer: Self-pay | Admitting: Internal Medicine

## 2022-10-21 ENCOUNTER — Other Ambulatory Visit: Payer: BC Managed Care – PPO

## 2022-10-21 ENCOUNTER — Other Ambulatory Visit: Payer: Self-pay | Admitting: Hematology and Oncology

## 2022-10-21 ENCOUNTER — Encounter: Payer: Self-pay | Admitting: Oncology

## 2022-10-21 DIAGNOSIS — Z9889 Other specified postprocedural states: Secondary | ICD-10-CM

## 2022-10-22 ENCOUNTER — Other Ambulatory Visit: Payer: Self-pay

## 2022-11-01 ENCOUNTER — Encounter: Payer: Self-pay | Admitting: Oncology

## 2022-11-01 NOTE — Telephone Encounter (Signed)
error 

## 2022-11-29 ENCOUNTER — Other Ambulatory Visit: Payer: BC Managed Care – PPO

## 2022-11-29 ENCOUNTER — Ambulatory Visit: Payer: BC Managed Care – PPO | Admitting: Hematology and Oncology

## 2022-12-15 ENCOUNTER — Ambulatory Visit
Admission: RE | Admit: 2022-12-15 | Discharge: 2022-12-15 | Disposition: A | Discharge status: Home / Self Care | Payer: BLUE CROSS/BLUE SHIELD | Source: Ambulatory Visit | Attending: Hematology and Oncology

## 2022-12-15 DIAGNOSIS — Z9889 Other specified postprocedural states: Secondary | ICD-10-CM

## 2022-12-16 ENCOUNTER — Other Ambulatory Visit: Payer: Self-pay

## 2022-12-20 ENCOUNTER — Other Ambulatory Visit: Payer: Self-pay

## 2022-12-20 DIAGNOSIS — Z170 Estrogen receptor positive status [ER+]: Secondary | ICD-10-CM

## 2022-12-20 NOTE — Assessment & Plan Note (Signed)
10/29/2019: Sickle cell trait (56.6% hemoglobin A, 2.8% hemoglobin A 2, 40.6% hemoglobin S, 0% hemoglobin F).  06/29/2018: T2N0 grade 2 IDC ER/PR positive HER2 negative Ki-67 10% 07/18/2018: Genetics: Negative 08/22/2018: Left lumpectomy T2N0 stage Ib grade 2 IDC negative margins 0/10 lymph nodes, Oncotype 34: R OR 22% 09/29/2018-11/30/2018: Taxotere and Cytoxan every 3 weeks x4 12/26/2018-01/24/2019: Adjuvant radiation 04/16/2020: Right lumpectomy: Complex sclerosing lesion   Current treatment: Tamoxifen started 01/24/2018 Tamoxifen toxicities: tolerating well Anxiety: on Buspar as needed   Full time Principal at school at [employer]   Breast cancer surveillance: Breast exam 12/21/2022: Benign Mammogram 12/15/2022: Benign breast density category C (patient wants diagnostic mammograms annually)   Lost 57 lb with Wegovy Slight elevation of AST and alkaline phosphatase: I discussed with her that it is mild and it needs to be rechecked in 3 to 6 months by her primary care physician.  She will contact and get that checked.   She currently works in [place] but has a home in [place]. Patient stays extremely busy with work travel and her husband's condition (Renal failure: travel nurse: Peritoneal dialysis at home) Return to clinic in 1 year for follow-up

## 2022-12-21 ENCOUNTER — Inpatient Hospital Stay: Payer: BLUE CROSS/BLUE SHIELD | Attending: Hematology and Oncology | Admitting: Hematology and Oncology

## 2022-12-21 ENCOUNTER — Other Ambulatory Visit: Payer: BLUE CROSS/BLUE SHIELD

## 2022-12-21 VITALS — BP 123/79 | HR 89 | Temp 98.2°F | Resp 18 | Ht 66.5 in | Wt 183.0 lb

## 2022-12-21 DIAGNOSIS — Z79810 Long term (current) use of selective estrogen receptor modulators (SERMs): Secondary | ICD-10-CM | POA: Diagnosis not present

## 2022-12-21 DIAGNOSIS — R21 Rash and other nonspecific skin eruption: Secondary | ICD-10-CM | POA: Diagnosis not present

## 2022-12-21 DIAGNOSIS — D573 Sickle-cell trait: Secondary | ICD-10-CM | POA: Diagnosis not present

## 2022-12-21 DIAGNOSIS — Z1721 Progesterone receptor positive status: Secondary | ICD-10-CM | POA: Diagnosis not present

## 2022-12-21 DIAGNOSIS — Z1732 Human epidermal growth factor receptor 2 negative status: Secondary | ICD-10-CM | POA: Insufficient documentation

## 2022-12-21 DIAGNOSIS — C50412 Malignant neoplasm of upper-outer quadrant of left female breast: Secondary | ICD-10-CM | POA: Diagnosis not present

## 2022-12-21 DIAGNOSIS — F419 Anxiety disorder, unspecified: Secondary | ICD-10-CM | POA: Insufficient documentation

## 2022-12-21 DIAGNOSIS — Z170 Estrogen receptor positive status [ER+]: Secondary | ICD-10-CM | POA: Insufficient documentation

## 2022-12-21 DIAGNOSIS — R7401 Elevation of levels of liver transaminase levels: Secondary | ICD-10-CM | POA: Insufficient documentation

## 2022-12-21 LAB — CBC WITH DIFFERENTIAL (CANCER CENTER ONLY)
Abs Immature Granulocytes: 0.02 10*3/uL (ref 0.00–0.07)
Basophils Absolute: 0.1 10*3/uL (ref 0.0–0.1)
Basophils Relative: 1 %
Eosinophils Absolute: 0.2 10*3/uL (ref 0.0–0.5)
Eosinophils Relative: 2 %
HCT: 39.8 % (ref 36.0–46.0)
Hemoglobin: 13 g/dL (ref 12.0–15.0)
Immature Granulocytes: 0 %
Lymphocytes Relative: 23 %
Lymphs Abs: 1.8 10*3/uL (ref 0.7–4.0)
MCH: 27.1 pg (ref 26.0–34.0)
MCHC: 32.7 g/dL (ref 30.0–36.0)
MCV: 82.9 fL (ref 80.0–100.0)
Monocytes Absolute: 0.8 10*3/uL (ref 0.1–1.0)
Monocytes Relative: 10 %
Neutro Abs: 5 10*3/uL (ref 1.7–7.7)
Neutrophils Relative %: 64 %
Platelet Count: 217 10*3/uL (ref 150–400)
RBC: 4.8 MIL/uL (ref 3.87–5.11)
RDW: 12.7 % (ref 11.5–15.5)
WBC Count: 7.8 10*3/uL (ref 4.0–10.5)
nRBC: 0 % (ref 0.0–0.2)

## 2022-12-21 LAB — CMP (CANCER CENTER ONLY)
ALT: 8 U/L (ref 0–44)
AST: 11 U/L — ABNORMAL LOW (ref 15–41)
Albumin: 3.9 g/dL (ref 3.5–5.0)
Alkaline Phosphatase: 48 U/L (ref 38–126)
Anion gap: 5 (ref 5–15)
BUN: 15 mg/dL (ref 6–20)
CO2: 29 mmol/L (ref 22–32)
Calcium: 8.9 mg/dL (ref 8.9–10.3)
Chloride: 107 mmol/L (ref 98–111)
Creatinine: 0.86 mg/dL (ref 0.44–1.00)
GFR, Estimated: >60 mL/min (ref >60)
Glucose, Bld: 100 mg/dL — ABNORMAL HIGH (ref 70–99)
Potassium: 3.7 mmol/L (ref 3.5–5.1)
Sodium: 141 mmol/L (ref 135–145)
Total Bilirubin: 0.5 mg/dL (ref <1.2)
Total Protein: 6.9 g/dL (ref 6.5–8.1)

## 2022-12-21 MED ORDER — TAMOXIFEN CITRATE 20 MG PO TABS: 20.0000 mg | ORAL_TABLET | Freq: Every day | ORAL | 4 refills | Status: DC

## 2022-12-21 NOTE — Progress Notes (Signed)
Patient Care Team: Calvert Cantor, NP as PCP - General (Nurse Practitioner) Dorothy Puffer, MD as Consulting Physician (Radiation Oncology) Harriette Bouillon, MD as Consulting Physician (General Surgery) Silverio Lay, MD as Consulting Physician (Obstetrics and Gynecology) Marcelyn Bruins, MD as Consulting Physician (Allergy) Talmage Coin, MD as Consulting Physician (Endocrinology) Charna Elizabeth, MD as Consulting Physician (Gastroenterology) Serena Croissant, MD as Consulting Physician (Hematology and Oncology)  DIAGNOSIS:  Encounter Diagnosis  Name Primary?   Malignant neoplasm of upper-outer quadrant of left breast in female, estrogen receptor positive (HCC) Yes    SUMMARY OF ONCOLOGIC HISTORY: Oncology History  Malignant neoplasm of upper-outer quadrant of left breast in female, estrogen receptor positive (HCC)  07/05/2018 Initial Diagnosis   Malignant neoplasm of upper-outer quadrant of left breast in female, estrogen receptor positive (HCC)   08/22/2018 Cancer Staging   Staging form: Breast, AJCC 8th Edition - Pathologic stage from 08/22/2018: Stage IIA (pT2, pN0, cM0, G2, ER+, PR-, HER2-) - Signed by Loa Socks, NP on 09/06/2018   09/29/2018 -  Chemotherapy   The patient had dexamethasone (DECADRON) 4 MG tablet, 8 mg, Oral, 2 times daily, 1 of 1 cycle, Start date: 09/11/2018, End date: 01/25/2019 palonosetron (ALOXI) injection 0.25 mg, 0.25 mg, Intravenous,  Once, 4 of 4 cycles Administration: 0.25 mg (09/29/2018), 0.25 mg (10/19/2018), 0.25 mg (11/09/2018), 0.25 mg (11/30/2018) pegfilgrastim-jmdb (FULPHILA) injection 6 mg, 6 mg, Subcutaneous,  Once, 4 of 4 cycles Administration: 6 mg (09/30/2018), 6 mg (10/21/2018), 6 mg (11/11/2018) cyclophosphamide (CYTOXAN) 1,260 mg in sodium chloride 0.9 % 250 mL chemo infusion, 600 mg/m2 = 1,260 mg, Intravenous,  Once, 4 of 4 cycles Administration: 1,260 mg (09/29/2018), 1,260 mg (10/19/2018), 1,260 mg (11/09/2018), 1,260 mg  (11/30/2018) DOCEtaxel (TAXOTERE) 160 mg in sodium chloride 0.9 % 250 mL chemo infusion, 75 mg/m2 = 160 mg, Intravenous,  Once, 4 of 4 cycles Administration: 160 mg (09/29/2018), 160 mg (10/19/2018), 160 mg (11/09/2018), 160 mg (11/30/2018)  for chemotherapy treatment.      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 She has been on tamoxifen for the past five years, which was initiated in January 2020. She reports occasional itching on the side where she had cancer and radiation, which seems to be triggered by different types of soap, lotion, or bra texture. The patient also mentions that she experiences a jab of pain in the same area, especially after a mammogram. She has not had any menstrual cycles for several years, indicating she is in menopause. The patient's husband is dealing with kidney issues and is on a transplant list, which adds to the patient's stress.         ALLERGIES:  is allergic to apple juice, iohexol, other, povidone-iodine, shellfish allergy, peach [prunus persica], zocor [simvastatin], betadine [povidone iodine], effexor [venlafaxine], valsartan, wound dressing adhesive, and latex.  MEDICATIONS:  Current Outpatient Medications  Medication Sig Dispense Refill   albuterol (VENTOLIN HFA) 108 (90 Base) MCG/ACT inhaler Use 2 puffs every four hours as needed for cough or wheeze. 18 g 1   budesonide-formoterol (SYMBICORT) 160-4.5 MCG/ACT inhaler Inhale 2 puffs into the lungs in the morning and at bedtime. with spacer and rinse mouth afterwards. 1 each 5   Cholecalciferol (VITAMIN D3) 50 MCG (2000 UT) TABS Take 2,000 Units by mouth every evening.     EPINEPHrine 0.3 mg/0.3 mL  IJ SOAJ injection Use as directed for life threatening allergic reactions 2 each 3   folic acid (FOLVITE) 1 MG tablet Take 1 mg by mouth every evening.       ipratropium (ATROVENT) 0.03 % nasal spray Place 2 sprays into both nostrils 3 (three) times daily as needed for rhinitis. 30 mL 12   levothyroxine (SYNTHROID, LEVOTHROID) 112 MCG tablet Take 112 mcg by mouth daily before breakfast.      montelukast (SINGULAIR) 10 MG tablet Take 1 tablet (10 mg total) by mouth at bedtime. 30 tablet 5   Olopatadine HCl (PATADAY) 0.7 % SOLN Apply 1 drop to eye daily as needed (itchy/watery eyes). 2.5 mL 5   tamoxifen (NOLVADEX) 20 MG tablet Take 1 tablet (20 mg total) by mouth daily. 90 tablet 4   Current Facility-Administered Medications  Medication Dose Route Frequency Provider Last Rate Last Admin   EPINEPHrine (EPI-PEN) injection 0.3 mg  0.3 mg Intramuscular Once Marcelyn Bruins, MD        PHYSICAL EXAMINATION: ECOG PERFORMANCE STATUS: 1 - Symptomatic but completely ambulatory  Vitals:   12/21/22 0907  BP: 123/79  Pulse: 89  Resp: 18  Temp: 98.2 F (36.8 C)  SpO2: 98%   Filed Weights   12/21/22 0907  Weight: 183 lb (83 kg)      LABORATORY DATA:  I have reviewed the data as listed    Latest Ref Rng & Units 12/21/2022    8:52 AM 11/24/2021   11:56 AM 11/25/2020   10:30 AM  CMP  Glucose 70 - 99 mg/dL 301  70  601   BUN 6 - 20 mg/dL 15  11  10    Creatinine 0.44 - 1.00 mg/dL 0.93  2.35  5.73   Sodium 135 - 145 mmol/L 141  142  140   Potassium 3.5 - 5.1 mmol/L 3.7  3.6  3.5   Chloride 98 - 111 mmol/L 107  106  107   CO2 22 - 32 mmol/L 29  29  25    Calcium 8.9 - 10.3 mg/dL 8.9  9.2  8.9   Total Protein 6.5 - 8.1 g/dL 6.9  7.3  7.2   Total Bilirubin <1.2 mg/dL 0.5  0.7  0.5   Alkaline Phos 38 - 126 U/L 48  71  64   AST 15 - 41 U/L 11  50  10   ALT 0 - 44 U/L 8  179  9     Lab Results  Component Value Date   WBC 7.8 12/21/2022   HGB 13.0 12/21/2022   HCT 39.8 12/21/2022   MCV 82.9 12/21/2022   PLT 217 12/21/2022   NEUTROABS 5.0 12/21/2022    ASSESSMENT & PLAN:  Malignant neoplasm of upper-outer quadrant of left  breast in female, estrogen receptor positive (HCC) 10/29/2019: Sickle cell trait (56.6% hemoglobin A, 2.8% hemoglobin A 2, 40.6% hemoglobin S, 0% hemoglobin F).  06/29/2018: T2N0 grade 2 IDC ER/PR positive HER2 negative Ki-67 10% 07/18/2018: Genetics: Negative 08/22/2018: Left lumpectomy T2N0 stage Ib grade 2 IDC negative margins 0/10 lymph nodes, Oncotype 34: R OR 22% 09/29/2018-11/30/2018: Taxotere and Cytoxan every 3 weeks x4 12/26/2018-01/24/2019: Adjuvant radiation 04/16/2020: Right lumpectomy: Complex sclerosing lesion   Current treatment: Tamoxifen started 01/24/2018 Tamoxifen toxicities: tolerating well Anxiety: on Buspar as needed   Full time Principal at school at [employer]   Breast cancer surveillance: Breast exam 12/21/2022: Benign Mammogram 12/15/2022: Benign breast density category C (patient wants diagnostic mammograms annually)  Lost 57 lb with Wegovy Slight elevation of AST and alkaline phosphatase: I discussed with her that it is mild and it needs to be rechecked in 3 to 6 months by her primary care physician.  She will contact and get that checked.   She currently works in [place] but has a home in [place]. Patient stays extremely busy with work travel and her husband's condition (Renal failure: travel nurse: Peritoneal dialysis at home) Return to clinic in 1 year for follow-up ------------------------------------- Assessment and Plan    Breast Cancer Completed 5 years of Tamoxifen therapy in January 2025. Discussed the option of continuing hormone therapy for another 5 years and the choice between continuing Tamoxifen or switching to an aromatase inhibitor. Patient opted to continue with Tamoxifen due to familiarity and avoidance of potential side effects of aromatase inhibitors. -Continue Tamoxifen for another 5 years.  Skin Irritation at Radiation Site Reports occasional itching at the site of previous radiation therapy, particularly with changes in soap,  lotion, or bra material. -Recommended regular moisturizing of the area with CeraVe or a similar product.  General Health Maintenance Discussed the importance of self-care and regular exercise amidst a busy schedule. -Encouraged to incorporate regular exercise into weekly routine.       No orders of the defined types were placed in this encounter.  The patient has a good understanding of the overall plan. she agrees with it. she will call with any problems that may develop before the next visit here. Total time spent: 30 mins including face to face time and time spent for planning, charting and co-ordination of care   Tamsen Meek, MD 12/21/22

## 2022-12-23 ENCOUNTER — Other Ambulatory Visit: Payer: Self-pay

## 2023-01-17 ENCOUNTER — Encounter: Payer: Self-pay | Admitting: Oncology

## 2023-02-04 ENCOUNTER — Other Ambulatory Visit: Payer: Self-pay | Admitting: Allergy

## 2023-10-15 ENCOUNTER — Other Ambulatory Visit (HOSPITAL_COMMUNITY): Payer: Self-pay

## 2023-10-16 ENCOUNTER — Encounter: Payer: Self-pay | Admitting: Oncology

## 2023-10-16 ENCOUNTER — Other Ambulatory Visit (HOSPITAL_COMMUNITY): Payer: Self-pay

## 2023-10-16 MED ORDER — TAMOXIFEN CITRATE 20 MG PO TABS
20.0000 mg | ORAL_TABLET | Freq: Every day | ORAL | 1 refills | Status: DC
  Filled 2023-10-16 – 2023-12-03 (×2): qty 90, 90d supply, fill #0

## 2023-10-16 MED ORDER — LEVOTHYROXINE SODIUM 112 MCG PO TABS
112.0000 ug | ORAL_TABLET | Freq: Every day | ORAL | 6 refills | Status: DC
  Filled 2023-10-16 – 2023-12-03 (×2): qty 30, 30d supply, fill #0

## 2023-10-16 MED ORDER — BUSPIRONE HCL 15 MG PO TABS
15.0000 mg | ORAL_TABLET | Freq: Three times a day (TID) | ORAL | 1 refills | Status: AC | PRN
  Filled 2023-10-16 – 2023-11-11 (×2): qty 90, 30d supply, fill #0
  Filled 2023-12-29: qty 90, 30d supply, fill #1

## 2023-10-16 MED ORDER — MONTELUKAST SODIUM 10 MG PO TABS
10.0000 mg | ORAL_TABLET | Freq: Every day | ORAL | 1 refills | Status: DC
  Filled 2023-10-16 – 2023-12-08 (×4): qty 90, 90d supply, fill #0

## 2023-10-16 MED ORDER — MUPIROCIN 2 % EX OINT
1.0000 | TOPICAL_OINTMENT | Freq: Three times a day (TID) | CUTANEOUS | 1 refills | Status: DC
  Filled 2023-10-16: qty 15, 5d supply, fill #0

## 2023-10-16 MED ORDER — FOLIC ACID 1 MG PO TABS
1.0000 mg | ORAL_TABLET | Freq: Every day | ORAL | 3 refills | Status: DC
  Filled 2023-10-16 – 2023-12-03 (×2): qty 90, 90d supply, fill #0

## 2023-10-16 MED ORDER — CLOBETASOL PROPIONATE 0.05 % EX CREA
1.0000 | TOPICAL_CREAM | Freq: Two times a day (BID) | CUTANEOUS | 2 refills | Status: AC
  Filled 2023-10-16: qty 30, 30d supply, fill #0

## 2023-10-25 ENCOUNTER — Other Ambulatory Visit: Payer: Self-pay | Admitting: Obstetrics and Gynecology

## 2023-10-25 DIAGNOSIS — Z1231 Encounter for screening mammogram for malignant neoplasm of breast: Secondary | ICD-10-CM

## 2023-10-27 ENCOUNTER — Other Ambulatory Visit (HOSPITAL_COMMUNITY): Payer: Self-pay

## 2023-11-04 ENCOUNTER — Encounter: Payer: Self-pay | Admitting: Hematology and Oncology

## 2023-11-06 ENCOUNTER — Other Ambulatory Visit: Payer: Self-pay

## 2023-11-11 ENCOUNTER — Other Ambulatory Visit (HOSPITAL_COMMUNITY): Payer: Self-pay

## 2023-11-12 ENCOUNTER — Encounter: Payer: Self-pay | Admitting: Oncology

## 2023-11-12 ENCOUNTER — Other Ambulatory Visit (HOSPITAL_COMMUNITY): Payer: Self-pay

## 2023-11-26 ENCOUNTER — Other Ambulatory Visit (HOSPITAL_COMMUNITY): Payer: Self-pay

## 2023-12-03 ENCOUNTER — Other Ambulatory Visit (HOSPITAL_COMMUNITY): Payer: Self-pay

## 2023-12-04 ENCOUNTER — Other Ambulatory Visit (HOSPITAL_COMMUNITY): Payer: Self-pay

## 2023-12-08 ENCOUNTER — Other Ambulatory Visit (HOSPITAL_COMMUNITY): Payer: Self-pay

## 2023-12-20 ENCOUNTER — Other Ambulatory Visit (HOSPITAL_COMMUNITY): Payer: Self-pay

## 2023-12-20 ENCOUNTER — Other Ambulatory Visit (HOSPITAL_BASED_OUTPATIENT_CLINIC_OR_DEPARTMENT_OTHER): Payer: Self-pay

## 2023-12-20 MED ORDER — SEMAGLUTIDE-WEIGHT MANAGEMENT 0.5 MG/0.5ML ~~LOC~~ SOAJ
0.5000 mg | SUBCUTANEOUS | 1 refills | Status: AC
  Filled 2023-12-20: qty 2, 28d supply, fill #0

## 2023-12-21 ENCOUNTER — Other Ambulatory Visit (HOSPITAL_COMMUNITY): Payer: Self-pay

## 2023-12-22 ENCOUNTER — Ambulatory Visit

## 2023-12-22 ENCOUNTER — Ambulatory Visit: Payer: BLUE CROSS/BLUE SHIELD | Admitting: Hematology and Oncology

## 2023-12-28 ENCOUNTER — Inpatient Hospital Stay: Admitting: Hematology and Oncology

## 2023-12-30 ENCOUNTER — Encounter: Payer: Self-pay | Admitting: Oncology

## 2024-01-02 ENCOUNTER — Other Ambulatory Visit (HOSPITAL_COMMUNITY): Payer: Self-pay

## 2024-01-06 ENCOUNTER — Encounter: Payer: Self-pay | Admitting: Oncology

## 2024-01-06 ENCOUNTER — Inpatient Hospital Stay
Admission: RE | Admit: 2024-01-06 | Discharge: 2024-01-06 | Attending: Obstetrics and Gynecology | Admitting: Obstetrics and Gynecology

## 2024-01-06 DIAGNOSIS — Z1231 Encounter for screening mammogram for malignant neoplasm of breast: Secondary | ICD-10-CM

## 2024-01-09 ENCOUNTER — Inpatient Hospital Stay: Attending: Hematology and Oncology | Admitting: Hematology and Oncology

## 2024-01-09 ENCOUNTER — Other Ambulatory Visit (HOSPITAL_COMMUNITY): Payer: Self-pay

## 2024-01-09 ENCOUNTER — Other Ambulatory Visit: Payer: Self-pay

## 2024-01-09 ENCOUNTER — Encounter: Payer: Self-pay | Admitting: Oncology

## 2024-01-09 VITALS — BP 120/79 | HR 81 | Temp 98.6°F | Resp 18 | Ht 66.5 in | Wt 195.0 lb

## 2024-01-09 DIAGNOSIS — Z170 Estrogen receptor positive status [ER+]: Secondary | ICD-10-CM | POA: Diagnosis not present

## 2024-01-09 DIAGNOSIS — Z79899 Other long term (current) drug therapy: Secondary | ICD-10-CM | POA: Insufficient documentation

## 2024-01-09 DIAGNOSIS — Z79810 Long term (current) use of selective estrogen receptor modulators (SERMs): Secondary | ICD-10-CM | POA: Diagnosis not present

## 2024-01-09 DIAGNOSIS — R232 Flushing: Secondary | ICD-10-CM | POA: Insufficient documentation

## 2024-01-09 DIAGNOSIS — Z1721 Progesterone receptor positive status: Secondary | ICD-10-CM | POA: Insufficient documentation

## 2024-01-09 DIAGNOSIS — D573 Sickle-cell trait: Secondary | ICD-10-CM | POA: Insufficient documentation

## 2024-01-09 DIAGNOSIS — G479 Sleep disorder, unspecified: Secondary | ICD-10-CM | POA: Insufficient documentation

## 2024-01-09 DIAGNOSIS — Z1732 Human epidermal growth factor receptor 2 negative status: Secondary | ICD-10-CM | POA: Diagnosis not present

## 2024-01-09 DIAGNOSIS — C50412 Malignant neoplasm of upper-outer quadrant of left female breast: Secondary | ICD-10-CM | POA: Insufficient documentation

## 2024-01-09 DIAGNOSIS — F419 Anxiety disorder, unspecified: Secondary | ICD-10-CM | POA: Diagnosis not present

## 2024-01-09 DIAGNOSIS — R252 Cramp and spasm: Secondary | ICD-10-CM | POA: Diagnosis not present

## 2024-01-09 MED ORDER — BUSPIRONE HCL 7.5 MG PO TABS
7.5000 mg | ORAL_TABLET | Freq: Three times a day (TID) | ORAL | 6 refills | Status: AC
  Filled 2024-01-09: qty 90, 30d supply, fill #0

## 2024-01-09 MED ORDER — MELATONIN 5 MG PO TABS
5.0000 mg | ORAL_TABLET | Freq: Every day | ORAL | 3 refills | Status: AC
  Filled 2024-01-09: qty 30, 30d supply, fill #0
  Filled 2024-02-08: qty 30, 30d supply, fill #1

## 2024-01-09 MED ORDER — TAMOXIFEN CITRATE 20 MG PO TABS
20.0000 mg | ORAL_TABLET | Freq: Every day | ORAL | 4 refills | Status: AC
  Filled 2024-01-09: qty 90, 90d supply, fill #0

## 2024-01-09 MED ORDER — IPRATROPIUM BROMIDE 0.03 % NA SOLN
2.0000 | Freq: Two times a day (BID) | NASAL | 6 refills | Status: AC
  Filled 2024-01-09: qty 30, 75d supply, fill #0

## 2024-01-09 MED ORDER — WEGOVY 0.25 MG/0.5ML ~~LOC~~ SOAJ
0.5000 mL | SUBCUTANEOUS | 1 refills | Status: AC
  Filled 2024-01-09: qty 2, 28d supply, fill #0

## 2024-01-09 NOTE — Progress Notes (Signed)
 "  Patient Care Team: Buren Corean RAMAN, NP as PCP - General (Nurse Practitioner) Dewey Rush, MD as Consulting Physician (Radiation Oncology) Vanderbilt Ned, MD as Consulting Physician (General Surgery) Darcel Pool, MD as Consulting Physician (Obstetrics and Gynecology) Jeneal Danita Macintosh, MD as Consulting Physician (Allergy) Faythe Purchase, MD as Consulting Physician (Endocrinology) Kristie Lamprey, MD as Consulting Physician (Gastroenterology) Odean Potts, MD as Consulting Physician (Hematology and Oncology)  DIAGNOSIS:  Encounter Diagnosis  Name Primary?   Malignant neoplasm of upper-outer quadrant of left breast in female, estrogen receptor positive (HCC) Yes    SUMMARY OF ONCOLOGIC HISTORY: Oncology History  Malignant neoplasm of upper-outer quadrant of left breast in female, estrogen receptor positive (HCC)  07/05/2018 Initial Diagnosis   Malignant neoplasm of upper-outer quadrant of left breast in female, estrogen receptor positive (HCC)   08/22/2018 Cancer Staging   Staging form: Breast, AJCC 8th Edition - Pathologic stage from 08/22/2018: Stage IIA (pT2, pN0, cM0, G2, ER+, PR-, HER2-) - Signed by Crawford Morna Pickle, NP on 09/06/2018   09/29/2018 -  Chemotherapy   The patient had dexamethasone  (DECADRON ) 4 MG tablet, 8 mg, Oral, 2 times daily, 1 of 1 cycle, Start date: 09/11/2018, End date: 01/25/2019 palonosetron  (ALOXI ) injection 0.25 mg, 0.25 mg, Intravenous,  Once, 4 of 4 cycles Administration: 0.25 mg (09/29/2018), 0.25 mg (10/19/2018), 0.25 mg (11/09/2018), 0.25 mg (11/30/2018) pegfilgrastim -jmdb (FULPHILA ) injection 6 mg, 6 mg, Subcutaneous,  Once, 4 of 4 cycles Administration: 6 mg (09/30/2018), 6 mg (10/21/2018), 6 mg (11/11/2018) cyclophosphamide  (CYTOXAN ) 1,260 mg in sodium chloride  0.9 % 250 mL chemo infusion, 600 mg/m2 = 1,260 mg, Intravenous,  Once, 4 of 4 cycles Administration: 1,260 mg (09/29/2018), 1,260 mg (10/19/2018), 1,260 mg (11/09/2018), 1,260 mg  (11/30/2018) DOCEtaxel  (TAXOTERE ) 160 mg in sodium chloride  0.9 % 250 mL chemo infusion, 75 mg/m2 = 160 mg, Intravenous,  Once, 4 of 4 cycles Administration: 160 mg (09/29/2018), 160 mg (10/19/2018), 160 mg (11/09/2018), 160 mg (11/30/2018)  for chemotherapy treatment.      CHIEF COMPLIANT: Surveillance of breast cancer on tamoxifen   HISTORY OF PRESENT ILLNESS:  History of Present Illness Veronica Reilly is a 58 year old female with estrogen receptor-positive stage IIA invasive ductal carcinoma of the left breast, status post lumpectomy, adjuvant chemotherapy, radiation, and ongoing tamoxifen  therapy, who presents for routine oncology follow-up and management of tamoxifen -related adverse effects.  She had a screening mammogram three days ago and results are pending. She has no new breast symptoms or changes.  She has been on tamoxifen  for about six years. She has frequent nocturnal leg cramps, sometimes severe with toe curling. She has considered hydration, electrolytes, and reduced activity as possible causes. She denies hot flashes.  She reports sleep disturbance mainly when in [place], with awakenings at 3:00 AM and difficulty returning to sleep. Sleep is better at her home in [place]  with only brief awakenings to void. She attributes sleep issues to work stress and being away from her husband, which has improved since his kidney transplant and her completion of graduate studies.  She has a new diagnosis of lichen planus under dermatology care. She also notes episodic toe discoloration that was evaluated by podiatry and not felt to be cancerous.      ALLERGIES:  is allergic to apple juice, iohexol, other, povidone-iodine, shellfish allergy, peach [prunus persica], zocor [simvastatin], betadine [povidone iodine], effexor  [venlafaxine ], valsartan, wound dressing adhesive, and latex.  MEDICATIONS:  Current Outpatient Medications  Medication Sig Dispense Refill   busPIRone   (BUSPAR ) 15  MG tablet Take 1 tablet (15 mg total) by mouth 3 (three) times daily as needed. 90 tablet 1   Cholecalciferol (VITAMIN D3) 50 MCG (2000 UT) TABS Take 2,000 Units by mouth every evening. (Patient taking differently: Take 1,000 Units by mouth every evening.)     clobetasol  cream (TEMOVATE ) 0.05 % Apply 1 Application topically 2 (two) times daily Mon-Fri skip weekends 30 g 2   EPINEPHrine  0.3 mg/0.3 mL IJ SOAJ injection Use as directed for life threatening allergic reactions 2 each 3   folic acid  (FOLVITE ) 1 MG tablet Take 1 mg by mouth every evening.      ipratropium (ATROVENT ) 0.03 % nasal spray Place 2 sprays into both nostrils 3 (three) times daily as needed for rhinitis. 30 mL 12   levothyroxine  (SYNTHROID , LEVOTHROID) 112 MCG tablet Take 112 mcg by mouth daily before breakfast.      montelukast  (SINGULAIR ) 10 MG tablet Take 1 tablet (10 mg total) by mouth at bedtime. 30 tablet 5   Olopatadine  HCl (PATADAY ) 0.7 % SOLN Apply 1 drop to eye daily as needed (itchy/watery eyes). 2.5 mL 5   semaglutide -weight management (WEGOVY ) 0.5 MG/0.5ML SOAJ SQ injection Inject 0.5 mg into the skin once a week. 2 mL 1   albuterol  (VENTOLIN  HFA) 108 (90 Base) MCG/ACT inhaler Use 2 puffs every four hours as needed for cough or wheeze. (Patient not taking: Reported on 01/09/2024) 18 g 1   budesonide -formoterol  (SYMBICORT ) 160-4.5 MCG/ACT inhaler Inhale 2 puffs into the lungs in the morning and at bedtime. with spacer and rinse mouth afterwards. (Patient not taking: Reported on 01/09/2024) 1 each 5   tamoxifen  (NOLVADEX ) 20 MG tablet Take 1 tablet (20 mg total) by mouth daily. 90 tablet 4   Current Facility-Administered Medications  Medication Dose Route Frequency Provider Last Rate Last Admin   EPINEPHrine  (EPI-PEN) injection 0.3 mg  0.3 mg Intramuscular Once Jeneal Danita Macintosh, MD        PHYSICAL EXAMINATION: ECOG PERFORMANCE STATUS: 1 - Symptomatic but completely ambulatory  Vitals:    01/09/24 1038  BP: 120/79  Pulse: 81  Resp: 18  Temp: 98.6 F (37 C)  SpO2: 100%   Filed Weights   01/09/24 1038  Weight: 195 lb (88.5 kg)    Physical Exam Breast exam: Scar tissue in the left breast otherwise benign  (exam performed in the presence of a chaperone)  LABORATORY DATA:  I have reviewed the data as listed    Latest Ref Rng & Units 12/21/2022    8:52 AM 11/24/2021   11:56 AM 11/25/2020   10:30 AM  CMP  Glucose 70 - 99 mg/dL 899  70  878   BUN 6 - 20 mg/dL 15  11  10    Creatinine 0.44 - 1.00 mg/dL 9.13  9.08  9.21   Sodium 135 - 145 mmol/L 141  142  140   Potassium 3.5 - 5.1 mmol/L 3.7  3.6  3.5   Chloride 98 - 111 mmol/L 107  106  107   CO2 22 - 32 mmol/L 29  29  25    Calcium 8.9 - 10.3 mg/dL 8.9  9.2  8.9   Total Protein 6.5 - 8.1 g/dL 6.9  7.3  7.2   Total Bilirubin <1.2 mg/dL 0.5  0.7  0.5   Alkaline Phos 38 - 126 U/L 48  71  64   AST 15 - 41 U/L 11  50  10   ALT 0 - 44 U/L 8  179  9     Lab Results  Component Value Date   WBC 7.8 12/21/2022   HGB 13.0 12/21/2022   HCT 39.8 12/21/2022   MCV 82.9 12/21/2022   PLT 217 12/21/2022   NEUTROABS 5.0 12/21/2022    ASSESSMENT & PLAN:  Malignant neoplasm of upper-outer quadrant of left breast in female, estrogen receptor positive (HCC) 10/29/2019: Sickle cell trait (56.6% hemoglobin A, 2.8% hemoglobin A 2, 40.6% hemoglobin S, 0% hemoglobin F).  06/29/2018: T2N0 grade 2 IDC ER/PR positive HER2 negative Ki-67 10% 07/18/2018: Genetics: Negative 08/22/2018: Left lumpectomy T2N0 stage Ib grade 2 IDC negative margins 0/10 lymph nodes, Oncotype 34: R OR 22% 09/29/2018-11/30/2018: Taxotere  and Cytoxan  every 3 weeks x4 12/26/2018-01/24/2019: Adjuvant radiation 04/16/2020: Right lumpectomy: Complex sclerosing lesion   Current treatment: Tamoxifen  started 01/24/2018 Tamoxifen  toxicities: tolerating well Anxiety: on Buspar  as needed   She just finished her doctorate in education at [employer] and she is now the superintendent of  elementary education in [place] .   Breast cancer surveillance: Breast exam 01/09/2024: Benign Mammogram 01/06/2024: Results pending (patient wants diagnostic mammograms annually)   Lost 57 lb with Wegovy   She currently works in [place]  but has a home in [place]. Her husband had a kidney transplant last year and she is significantly relieved of stress.    Return to clinic in 1 year for follow-up ------------------------------------- Assessment and Plan Assessment & Plan Estrogen receptor positive breast cancer of the left upper-outer quadrant Remains in remission post-lumpectomy, adjuvant chemotherapy, radiation, and tamoxifen . No disease evidence on recent exams. Favorable prognosis due to node-negative, ER+ status and completed adjuvant therapy. - Continued tamoxifen  20 mg daily, planned for 10 years total (currently year 6). - Ordered ctDNA blood test every 6 months for recurrence detection; negative result reassuring, positive result prompts closer monitoring and possible imaging. - Will communicate mammogram results once available. - Continue annual diagnostic mammogram and clinical breast exam at follow-up visits. - Return to clinic in one year unless symptoms arise.  Adverse effects of tamoxifen  therapy Experiences nocturnal leg cramps likely due to tamoxifen , with hydration, electrolyte status, and decreased activity as factors. Cramps are significant but manageable. Reports mild sleep disturbance and hot flashes, without endometrial or thromboembolic symptoms. Side effects do not warrant therapy change. - Recommended magnesium supplementation for leg cramps. - Recommended tonic water  as an alternative remedy for leg cramps. - Encouraged regular exercise and hydration to mitigate muscle cramps. - Monitor for additional tamoxifen -related adverse effects, including hot flashes, sleep disturbance, endometrial symptoms, and thromboembolic events.      No orders of the  defined types were placed in this encounter.  The patient has a good understanding of the overall plan. she agrees with it. she will call with any problems that may develop before the next visit here.  I personally spent a total of 30 minutes in the care of the patient today including preparing to see the patient, getting/reviewing separately obtained history, performing a medically appropriate exam/evaluation, counseling and educating, placing orders, referring and communicating with other health care professionals, documenting clinical information in the EHR, independently interpreting results, communicating results, and coordinating care.   Viinay K Carylon Tamburro, MD 01/09/2024    "

## 2024-01-09 NOTE — Assessment & Plan Note (Signed)
 10/29/2019: Sickle cell trait (56.6% hemoglobin A, 2.8% hemoglobin A 2, 40.6% hemoglobin S, 0% hemoglobin F).  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 Patient stays extremely busy with work travel and her husband's condition (Renal failure: travel nurse: Peritoneal dialysis at home) Return to clinic in 1 year for follow-up

## 2024-01-10 ENCOUNTER — Other Ambulatory Visit: Payer: Self-pay

## 2024-01-11 ENCOUNTER — Other Ambulatory Visit (HOSPITAL_COMMUNITY): Payer: Self-pay

## 2024-01-11 ENCOUNTER — Telehealth: Payer: Self-pay

## 2024-01-11 NOTE — Telephone Encounter (Signed)
 Per md orders entered for [facility] and all supported documents were uploaded through the portal.

## 2024-01-14 ENCOUNTER — Other Ambulatory Visit (HOSPITAL_COMMUNITY): Payer: Self-pay

## 2024-01-14 MED ORDER — LEVOTHYROXINE SODIUM 112 MCG PO TABS
112.0000 ug | ORAL_TABLET | Freq: Every day | ORAL | 6 refills | Status: AC
  Filled 2024-01-14: qty 30, 30d supply, fill #0
  Filled 2024-02-08: qty 30, 30d supply, fill #1

## 2024-01-20 ENCOUNTER — Other Ambulatory Visit (HOSPITAL_COMMUNITY): Payer: Self-pay

## 2024-01-20 MED ORDER — WEGOVY 1 MG/0.5ML ~~LOC~~ SOAJ
1.0000 mg | SUBCUTANEOUS | 0 refills | Status: AC
  Filled 2024-01-20: qty 2, 28d supply, fill #0

## 2024-02-08 ENCOUNTER — Encounter: Payer: Self-pay | Admitting: Oncology

## 2024-02-08 ENCOUNTER — Other Ambulatory Visit (HOSPITAL_COMMUNITY): Payer: Self-pay

## 2024-02-14 ENCOUNTER — Other Ambulatory Visit (HOSPITAL_COMMUNITY): Payer: Self-pay

## 2024-02-14 ENCOUNTER — Other Ambulatory Visit: Payer: Self-pay

## 2024-02-14 MED ORDER — WEGOVY 1.7 MG/0.75ML ~~LOC~~ SOAJ
1.7000 mg | SUBCUTANEOUS | 1 refills | Status: AC
  Filled 2024-02-14: qty 3, 28d supply, fill #0

## 2025-01-08 ENCOUNTER — Inpatient Hospital Stay: Admitting: Hematology and Oncology
